# Patient Record
Sex: Female | Born: 1950 | Race: White | Hispanic: No | Marital: Married | Smoking: Never smoker
Health system: Southern US, Community
[De-identification: ages and names within clinical notes are randomized; demographics above are authoritative.]

## PROBLEM LIST (undated history)

## (undated) DIAGNOSIS — J309 Allergic rhinitis, unspecified: Secondary | ICD-10-CM

## (undated) DIAGNOSIS — M1990 Unspecified osteoarthritis, unspecified site: Secondary | ICD-10-CM

## (undated) DIAGNOSIS — IMO0001 Reserved for inherently not codable concepts without codable children: Secondary | ICD-10-CM

## (undated) HISTORY — DX: Unspecified osteoarthritis, unspecified site: M19.90

## (undated) HISTORY — DX: Reserved for inherently not codable concepts without codable children: IMO0001

## (undated) HISTORY — PX: MYRINGOTOMY: SUR874

## (undated) HISTORY — DX: Allergic rhinitis, unspecified: J30.9

---

## 1999-11-19 ENCOUNTER — Other Ambulatory Visit: Admission: RE | Admit: 1999-11-19 | Discharge: 1999-11-19 | Payer: Self-pay | Admitting: Internal Medicine

## 1999-12-23 ENCOUNTER — Ambulatory Visit (HOSPITAL_COMMUNITY): Admission: RE | Admit: 1999-12-23 | Discharge: 1999-12-23 | Payer: Self-pay | Admitting: Internal Medicine

## 1999-12-23 ENCOUNTER — Encounter: Payer: Self-pay | Admitting: Internal Medicine

## 2000-11-11 ENCOUNTER — Other Ambulatory Visit: Admission: RE | Admit: 2000-11-11 | Discharge: 2000-11-11 | Payer: Self-pay | Admitting: Internal Medicine

## 2001-01-05 ENCOUNTER — Encounter: Payer: Self-pay | Admitting: Internal Medicine

## 2001-01-05 ENCOUNTER — Ambulatory Visit (HOSPITAL_COMMUNITY): Admission: RE | Admit: 2001-01-05 | Discharge: 2001-01-05 | Payer: Self-pay | Admitting: Internal Medicine

## 2001-10-19 ENCOUNTER — Other Ambulatory Visit: Admission: RE | Admit: 2001-10-19 | Discharge: 2001-10-19 | Payer: Self-pay | Admitting: Internal Medicine

## 2002-01-30 ENCOUNTER — Encounter: Payer: Self-pay | Admitting: Internal Medicine

## 2002-01-30 ENCOUNTER — Ambulatory Visit (HOSPITAL_COMMUNITY): Admission: RE | Admit: 2002-01-30 | Discharge: 2002-01-30 | Payer: Self-pay | Admitting: Internal Medicine

## 2003-01-31 ENCOUNTER — Ambulatory Visit (HOSPITAL_COMMUNITY): Admission: RE | Admit: 2003-01-31 | Discharge: 2003-01-31 | Payer: Self-pay | Admitting: Internal Medicine

## 2003-01-31 ENCOUNTER — Encounter: Payer: Self-pay | Admitting: Internal Medicine

## 2003-03-04 ENCOUNTER — Other Ambulatory Visit: Admission: RE | Admit: 2003-03-04 | Discharge: 2003-03-04 | Payer: Self-pay | Admitting: Internal Medicine

## 2004-02-06 ENCOUNTER — Ambulatory Visit (HOSPITAL_COMMUNITY): Admission: RE | Admit: 2004-02-06 | Discharge: 2004-02-06 | Payer: Self-pay | Admitting: Internal Medicine

## 2005-02-23 ENCOUNTER — Ambulatory Visit: Payer: Self-pay | Admitting: Internal Medicine

## 2005-04-13 ENCOUNTER — Ambulatory Visit (HOSPITAL_COMMUNITY): Admission: RE | Admit: 2005-04-13 | Discharge: 2005-04-13 | Payer: Self-pay | Admitting: Internal Medicine

## 2006-11-08 ENCOUNTER — Ambulatory Visit (HOSPITAL_COMMUNITY): Admission: RE | Admit: 2006-11-08 | Discharge: 2006-11-08 | Payer: Self-pay | Admitting: Internal Medicine

## 2006-12-14 ENCOUNTER — Ambulatory Visit: Payer: Self-pay | Admitting: Internal Medicine

## 2006-12-14 LAB — CONVERTED CEMR LAB
AST: 19 units/L (ref 0–37)
Albumin: 4 g/dL (ref 3.5–5.2)
Alkaline Phosphatase: 101 units/L (ref 39–117)
BUN: 13 mg/dL (ref 6–23)
Basophils Relative: 0 % (ref 0.0–1.0)
Calcium: 9.3 mg/dL (ref 8.4–10.5)
Cholesterol: 158 mg/dL (ref 0–200)
Eosinophil percent: 6.8 % — ABNORMAL HIGH (ref 0.0–5.0)
HCT: 40 % (ref 36.0–46.0)
Hemoglobin: 14 g/dL (ref 12.0–15.0)
Neutrophils Relative %: 58.4 % (ref 43.0–77.0)
Potassium: 4.1 meq/L (ref 3.5–5.1)
RDW: 12.4 % (ref 11.5–14.6)
Total Bilirubin: 0.6 mg/dL (ref 0.3–1.2)
Total Protein: 7.3 g/dL (ref 6.0–8.3)
Triglyceride fasting, serum: 131 mg/dL (ref 0–149)

## 2006-12-15 ENCOUNTER — Encounter: Payer: Self-pay | Admitting: Internal Medicine

## 2006-12-15 LAB — CONVERTED CEMR LAB: Glucose, Bld: 80 mg/dL (ref 70–99)

## 2006-12-20 ENCOUNTER — Encounter: Payer: Self-pay | Admitting: Internal Medicine

## 2006-12-20 ENCOUNTER — Other Ambulatory Visit: Admission: RE | Admit: 2006-12-20 | Discharge: 2006-12-20 | Payer: Self-pay | Admitting: Internal Medicine

## 2006-12-20 ENCOUNTER — Ambulatory Visit: Payer: Self-pay | Admitting: Internal Medicine

## 2007-09-07 ENCOUNTER — Telehealth: Payer: Self-pay | Admitting: *Deleted

## 2007-10-13 ENCOUNTER — Ambulatory Visit: Payer: Self-pay | Admitting: Internal Medicine

## 2008-04-05 ENCOUNTER — Telehealth: Payer: Self-pay | Admitting: *Deleted

## 2008-04-09 ENCOUNTER — Telehealth: Payer: Self-pay | Admitting: Internal Medicine

## 2008-04-11 ENCOUNTER — Ambulatory Visit: Payer: Self-pay | Admitting: Internal Medicine

## 2008-04-11 DIAGNOSIS — E786 Lipoprotein deficiency: Secondary | ICD-10-CM | POA: Insufficient documentation

## 2008-04-11 DIAGNOSIS — I059 Rheumatic mitral valve disease, unspecified: Secondary | ICD-10-CM | POA: Insufficient documentation

## 2008-04-11 DIAGNOSIS — R21 Rash and other nonspecific skin eruption: Secondary | ICD-10-CM | POA: Insufficient documentation

## 2008-04-11 DIAGNOSIS — H6980 Other specified disorders of Eustachian tube, unspecified ear: Secondary | ICD-10-CM | POA: Insufficient documentation

## 2008-08-01 ENCOUNTER — Ambulatory Visit: Payer: Self-pay | Admitting: Internal Medicine

## 2008-08-01 LAB — CONVERTED CEMR LAB
Albumin: 4.1 g/dL (ref 3.5–5.2)
BUN: 14 mg/dL (ref 6–23)
Bilirubin Urine: NEGATIVE
Calcium: 9.3 mg/dL (ref 8.4–10.5)
Creatinine, Ser: 0.9 mg/dL (ref 0.4–1.2)
Eosinophils Absolute: 0.3 10*3/uL (ref 0.0–0.7)
Eosinophils Relative: 6.3 % — ABNORMAL HIGH (ref 0.0–5.0)
GFR calc Af Amer: 84 mL/min
GFR calc non Af Amer: 69 mL/min
Glucose, Urine, Semiquant: NEGATIVE
HCT: 37.7 % (ref 36.0–46.0)
HDL: 28.7 mg/dL — ABNORMAL LOW (ref >39.0)
MCV: 93.9 fL (ref 78.0–100.0)
Monocytes Absolute: 0.5 10*3/uL (ref 0.1–1.0)
Platelets: 233 10*3/uL (ref 150–400)
Potassium: 4.1 meq/L (ref 3.5–5.1)
Specific Gravity, Urine: >=1.03
TSH: 3.55 microintl units/mL (ref 0.35–5.50)
Triglycerides: 127 mg/dL (ref 0–149)
WBC Urine, dipstick: NEGATIVE
WBC: 5.1 10*3/uL (ref 4.5–10.5)
pH: 5

## 2008-08-06 ENCOUNTER — Encounter: Payer: Self-pay | Admitting: Internal Medicine

## 2008-08-06 ENCOUNTER — Other Ambulatory Visit: Admission: RE | Admit: 2008-08-06 | Discharge: 2008-08-06 | Payer: Self-pay | Admitting: Internal Medicine

## 2008-08-06 ENCOUNTER — Ambulatory Visit: Payer: Self-pay | Admitting: Internal Medicine

## 2008-08-06 DIAGNOSIS — D485 Neoplasm of uncertain behavior of skin: Secondary | ICD-10-CM | POA: Insufficient documentation

## 2008-08-06 DIAGNOSIS — J309 Allergic rhinitis, unspecified: Secondary | ICD-10-CM | POA: Insufficient documentation

## 2008-08-06 DIAGNOSIS — E669 Obesity, unspecified: Secondary | ICD-10-CM | POA: Insufficient documentation

## 2008-08-06 DIAGNOSIS — E785 Hyperlipidemia, unspecified: Secondary | ICD-10-CM | POA: Insufficient documentation

## 2008-08-29 ENCOUNTER — Ambulatory Visit (HOSPITAL_COMMUNITY): Admission: RE | Admit: 2008-08-29 | Discharge: 2008-08-29 | Payer: Self-pay | Admitting: Internal Medicine

## 2008-09-18 ENCOUNTER — Ambulatory Visit: Payer: Self-pay | Admitting: Internal Medicine

## 2009-02-11 ENCOUNTER — Telehealth: Payer: Self-pay | Admitting: Internal Medicine

## 2009-02-20 ENCOUNTER — Ambulatory Visit: Payer: Self-pay | Admitting: Cardiovascular Disease

## 2009-02-20 ENCOUNTER — Ambulatory Visit: Payer: Self-pay

## 2009-09-23 ENCOUNTER — Ambulatory Visit: Payer: Self-pay | Admitting: Internal Medicine

## 2010-10-06 ENCOUNTER — Ambulatory Visit: Payer: Self-pay | Admitting: Internal Medicine

## 2010-12-28 ENCOUNTER — Encounter: Payer: Self-pay | Admitting: Internal Medicine

## 2011-01-05 NOTE — Assessment & Plan Note (Signed)
Summary: Flu shot/ssc  Nurse Visit   Allergies: No Known Drug Allergies  Orders Added: 1)  Admin 1st Vaccine [90471] 2)  Flu Vaccine 102yrs + [30865]      Flu Vaccine Consent Questions     Do you have a history of severe allergic reactions to this vaccine? no    Any prior history of allergic reactions to egg and/or gelatin? no    Do you have a sensitivity to the preservative Thimersol? no    Do you have a past history of Guillan-Barre Syndrome? no    Do you currently have an acute febrile illness? no    Have you ever had a severe reaction to latex? no    Vaccine information given and explained to patient? yes    Are you currently pregnant? no    Lot Number:[id number]   Exp Date:06/05/2011   Site Given  Left Deltoid IM Romualdo Bolk, CMA (AAMA)  October 06, 2010 9:29 AM

## 2011-12-16 ENCOUNTER — Other Ambulatory Visit: Payer: Self-pay | Admitting: Internal Medicine

## 2011-12-16 DIAGNOSIS — Z1231 Encounter for screening mammogram for malignant neoplasm of breast: Secondary | ICD-10-CM

## 2012-01-13 ENCOUNTER — Ambulatory Visit (HOSPITAL_COMMUNITY): Payer: Self-pay | Attending: Internal Medicine

## 2012-05-16 ENCOUNTER — Ambulatory Visit (HOSPITAL_COMMUNITY)
Admission: RE | Admit: 2012-05-16 | Discharge: 2012-05-16 | Disposition: A | Discharge status: Home / Self Care | Payer: BC Managed Care – PPO | Source: Ambulatory Visit | Attending: Internal Medicine | Admitting: Internal Medicine

## 2012-05-16 DIAGNOSIS — Z1231 Encounter for screening mammogram for malignant neoplasm of breast: Secondary | ICD-10-CM | POA: Insufficient documentation

## 2012-09-25 ENCOUNTER — Other Ambulatory Visit (INDEPENDENT_AMBULATORY_CARE_PROVIDER_SITE_OTHER): Payer: BC Managed Care – PPO

## 2012-09-25 DIAGNOSIS — Z0000 Encounter for general adult medical examination without abnormal findings: Secondary | ICD-10-CM

## 2012-09-25 LAB — HEPATIC FUNCTION PANEL
Bilirubin, Direct: 0.1 mg/dL (ref 0.0–0.3)
Total Protein: 6.9 g/dL (ref 6.0–8.3)

## 2012-09-25 LAB — CBC WITH DIFFERENTIAL/PLATELET
Basophils Relative: 0.7 % (ref 0.0–3.0)
Hemoglobin: 13.6 g/dL (ref 12.0–15.0)
Lymphocytes Relative: 22.1 % (ref 12.0–46.0)
Monocytes Relative: 10.6 % (ref 3.0–12.0)
Neutro Abs: 2.5 10*3/uL (ref 1.4–7.7)
RBC: 4.26 Mil/uL (ref 3.87–5.11)
WBC: 4.4 10*3/uL — ABNORMAL LOW (ref 4.5–10.5)

## 2012-09-25 LAB — BASIC METABOLIC PANEL
BUN: 16 mg/dL (ref 6–23)
CO2: 28 mEq/L (ref 19–32)
Chloride: 106 mEq/L (ref 96–112)
Creatinine, Ser: 0.9 mg/dL (ref 0.4–1.2)

## 2012-09-25 LAB — POCT URINALYSIS DIPSTICK
Glucose, UA: NEGATIVE
Nitrite, UA: NEGATIVE
Protein, UA: NEGATIVE
Urobilinogen, UA: 0.2

## 2012-09-25 LAB — TSH: TSH: 2.11 u[IU]/mL (ref 0.35–5.50)

## 2012-09-25 LAB — LIPID PANEL
LDL Cholesterol: 85 mg/dL (ref 0–99)
Total CHOL/HDL Ratio: 6
Triglycerides: 105 mg/dL (ref 0.0–149.0)
VLDL: 21 mg/dL (ref 0.0–40.0)

## 2012-10-02 ENCOUNTER — Encounter: Payer: Self-pay | Admitting: Internal Medicine

## 2012-10-02 ENCOUNTER — Other Ambulatory Visit (HOSPITAL_COMMUNITY)
Admission: RE | Admit: 2012-10-02 | Discharge: 2012-10-02 | Disposition: A | Discharge status: Home / Self Care | Payer: BC Managed Care – PPO | Source: Ambulatory Visit | Attending: Internal Medicine | Admitting: Internal Medicine

## 2012-10-02 ENCOUNTER — Ambulatory Visit (INDEPENDENT_AMBULATORY_CARE_PROVIDER_SITE_OTHER): Payer: BC Managed Care – PPO | Admitting: Internal Medicine

## 2012-10-02 VITALS — BP 122/80 | HR 101 | Temp 97.8°F | Ht 66.25 in | Wt 244.0 lb

## 2012-10-02 DIAGNOSIS — Z800 Family history of malignant neoplasm of digestive organs: Secondary | ICD-10-CM | POA: Insufficient documentation

## 2012-10-02 DIAGNOSIS — Z01419 Encounter for gynecological examination (general) (routine) without abnormal findings: Secondary | ICD-10-CM | POA: Insufficient documentation

## 2012-10-02 DIAGNOSIS — Z0000 Encounter for general adult medical examination without abnormal findings: Secondary | ICD-10-CM | POA: Insufficient documentation

## 2012-10-02 DIAGNOSIS — E786 Lipoprotein deficiency: Secondary | ICD-10-CM

## 2012-10-02 DIAGNOSIS — Z1211 Encounter for screening for malignant neoplasm of colon: Secondary | ICD-10-CM

## 2012-10-02 DIAGNOSIS — Z1151 Encounter for screening for human papillomavirus (HPV): Secondary | ICD-10-CM | POA: Insufficient documentation

## 2012-10-02 DIAGNOSIS — N760 Acute vaginitis: Secondary | ICD-10-CM | POA: Insufficient documentation

## 2012-10-02 NOTE — Progress Notes (Signed)
Subjective:    Patient ID: Natasha [name], female    DOB: 1951-03-09, 61 y.o.   MRN: [id number]  HPI Patient comes in today for Preventive Health Care visit  Last visit was September 2009. Since that time no major injuries hosp ed visits or other change in health. 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  Her last Pap was more than 3 years ago. She's not sure what her colonoscopy was done but she thinks she is due.  Review of Systems ROS:  Had episode of mid chest pain possibly related to swallowing in the summer has resolved. Had episode of left ankle pain when she rose in the morning with difficulty walking not associated with injury twisting redness. It is since resolved. No regular exercise recently. No history of gout. GEN/ HEENT: No fever, significant weight changes sweats headaches vision problems hearing changes, CV/ PULM; No chest pain shortness of breath cough, syncope,edema  change in exercise tolerance. GI /GU: No adominal pain, vomiting, change in bowel habits. No blood in the stool. No significant GU symptoms. SKIN/HEME: ,no acute skin rashes suspicious lesions or bleeding. No lymphadenopathy, nodules, masses.  NEURO/ PSYCH:  No neurologic signs such as weakness numbness. No depression anxiety. IMM/ Allergy: No unusual infections.  Allergy .   REST of 12 system review negative except as per HPI Past history family history social history reviewed in the electronic medical record.     Objective:   Physical Exam BP 122/80  Pulse 101  Temp 97.8 F (36.6 C) (Oral)  Ht 5' 6.25" (1.683 m)  Wt 244 lb (110.678 kg)  BMI 39.09 kg/m2  SpO2 97% Wt Readings from Last 3 Encounters:  10/02/12 244 lb (110.678 kg)  08/06/08 244 lb (110.678 kg)   Physical Exam: Vital signs reviewed ION:GEXB is a well-developed well-nourished alert cooperative  white female who appears her stated age in no acute distress.  HEENT: normocephalic atraumatic , Eyes: PERRL EOM's  full, conjunctiva clear, Nares: paten,t no deformity discharge or tenderness., Ears: no deformity EAC's clear TMs with normal landmarks. Mouth: clear OP, no lesions, edema.  Moist mucous membranes. Dentition in adequate repair. NECK: supple without masses, thyromegaly or bruits. CHEST/PULM:  Clear to auscultation and percussion breath sounds equal no wheeze , rales or rhonchi. No chest wall deformities or tenderness. Breast: normal by inspection . No dimpling, discharge, masses, tenderness or discharge .lumpy but no discrete lump. CV: PMI is nondisplaced, S1 S2 no gallops, murmurs, rubs. Peripheral pulses are full without delay.No JVD .  ABDOMEN: Bowel sounds normal nontender  No guard or rebound, no hepato splenomegal no CVA tenderness.  No hernia. Extremtities:  No clubbing cyanosis or edema, no acute joint swelling or redness no focal atrophy NEURO:  Oriented x3, cranial nerves 3-12 appear to be intact, no obvious focal weakness,gait within normal limits no abnormal reflexes or asymmetrical SKIN: No acute rashes normal turgor, color, no bruising or petechiae. PSYCH: Oriented, good eye contact, no obvious depression anxiety, cognition and judgment appear normal. LN: no cervical axillary inguinal adenopathy  Pelvic: NL ext GU, labia clear without lesions or rash . Vagina no lesions .Cervix: clear  UTERUS: Neg CMT Adnexa:  clear no masses . PAP done rectal no masses stool smear negative. HPV high risk  screen    Lab Results  Component Value Date   WBC 4.4* 09/25/2012   HGB 13.6 09/25/2012   HCT 40.8 09/25/2012   PLT 219.0 09/25/2012   GLUCOSE  90 09/25/2012   CHOL 129 09/25/2012   TRIG 105.0 09/25/2012   HDL 23.00* 09/25/2012   LDLCALC 85 09/25/2012   ALT 16 09/25/2012   AST 17 09/25/2012   NA 140 09/25/2012   K 4.1 09/25/2012   CL 106 09/25/2012   CREATININE 0.9 09/25/2012   BUN 16 09/25/2012   CO2 28 09/25/2012   TSH 2.11 09/25/2012        Assessment & Plan:  Preventive Health  Care Counseled regarding healthy nutrition, exercise, sleep, injury prevention, calcium vit d and healthy weight .PAP today with HPV screen Look  into shingles cvaccine  for next year or if reimbursed.  Flu vaccine LIPIDS low hdl   Counseled.  fam hx colon cancer prob due   No colon procedure in EMR

## 2012-10-02 NOTE — Patient Instructions (Signed)
Increase exercise healthy weight loss and avoiding transfats   Can help decrease HDL. I dont see documentation of colonoscopy in the EHR  You may be due for this .  Preventive Care for Adults, Female A healthy lifestyle and preventive care can promote health and wellness. Preventive health guidelines for women include the following key practices.  A routine yearly physical is a good way to check with your caregiver about your health and preventive screening. It is a chance to share any concerns and updates on your health, and to receive a thorough exam.  Visit your dentist for a routine exam and preventive care every 6 months. Brush your teeth twice a day and floss once a day. Good oral hygiene prevents tooth decay and gum disease.  The frequency of eye exams is based on your age, health, family medical history, use of contact lenses, and other factors. Follow your caregiver's recommendations for frequency of eye exams.  Eat a healthy diet. Foods like vegetables, fruits, whole grains, low-fat dairy products, and lean protein foods contain the nutrients you need without too many calories. Decrease your intake of foods high in solid fats, added sugars, and salt. Eat the right amount of calories for you.Get information about a proper diet from your caregiver, if necessary.  Regular physical exercise is one of the most important things you can do for your health. Most adults should get at least 150 minutes of moderate-intensity exercise (any activity that increases your heart rate and causes you to sweat) each week. In addition, most adults need muscle-strengthening exercises on 2 or more days a week.  Maintain a healthy weight. The body mass index (BMI) is a screening tool to identify possible weight problems. It provides an estimate of body fat based on height and weight. Your caregiver can help determine your BMI, and can help you achieve or maintain a healthy weight.For adults 20 years and  older:  A BMI below 18.5 is considered underweight.  A BMI of 18.5 to 24.9 is normal.  A BMI of 25 to 29.9 is considered overweight.  A BMI of 30 and above is considered obese.  Maintain normal blood lipids and cholesterol levels by exercising and minimizing your intake of saturated fat. Eat a balanced diet with plenty of fruit and vegetables. Blood tests for lipids and cholesterol should begin at age 70 and be repeated every 5 years. If your lipid or cholesterol levels are high, you are over 50, or you are at high risk for heart disease, you may need your cholesterol levels checked more frequently.Ongoing high lipid and cholesterol levels should be treated with medicines if diet and exercise are not effective.  If you smoke, find out from your caregiver how to quit. If you do not use tobacco, do not start.  If you are pregnant, do not drink alcohol. If you are breastfeeding, be very cautious about drinking alcohol. If you are not pregnant and choose to drink alcohol, do not exceed 1 drink per day. One drink is considered to be 12 ounces (355 mL) of beer, 5 ounces (148 mL) of wine, or 1.5 ounces (44 mL) of liquor.  Avoid use of street drugs. Do not share needles with anyone. Ask for help if you need support or instructions about stopping the use of drugs.  High blood pressure causes heart disease and increases the risk of stroke. Your blood pressure should be checked at least every 1 to 2 years. Ongoing high blood pressure should be treated  with medicines if weight loss and exercise are not effective.  If you are 70 to 59 years old, ask your caregiver if you should take aspirin to prevent strokes.  Diabetes screening involves taking a blood sample to check your fasting blood sugar level. This should be done once every 3 years, after age 8, if you are within normal weight and without risk factors for diabetes. Testing should be considered at a younger age or be carried out more frequently if  you are overweight and have at least 1 risk factor for diabetes.  Breast cancer screening is essential preventive care for women. You should practice "breast self-awareness." This means understanding the normal appearance and feel of your breasts and may include breast self-examination. Any changes detected, no matter how small, should be reported to a caregiver. Women in their 4s and 30s should have a clinical breast exam (CBE) by a caregiver as part of a regular health exam every 1 to 3 years. After age 1, women should have a CBE every year. Starting at age 39, women should consider having a mammography (breast X-ray test) every year. Women who have a family history of breast cancer should talk to their caregiver about genetic screening. Women at a high risk of breast cancer should talk to their caregivers about having magnetic resonance imaging (MRI) and a mammography every year.  The Pap test is a screening test for cervical cancer. A Pap test can show cell changes on the cervix that might become cervical cancer if left untreated. A Pap test is a procedure in which cells are obtained and examined from the lower end of the uterus (cervix).  Women should have a Pap test starting at age 52.  Between ages 92 and 33, Pap tests should be repeated every 2 years.  Beginning at age 33, you should have a Pap test every 3 years as long as the past 3 Pap tests have been normal.  Some women have medical problems that increase the chance of getting cervical cancer. Talk to your caregiver about these problems. It is especially important to talk to your caregiver if a new problem develops soon after your last Pap test. In these cases, your caregiver may recommend more frequent screening and Pap tests.  The above recommendations are the same for women who have or have not gotten the vaccine for human papillomavirus (HPV).  If you had a hysterectomy for a problem that was not cancer or a condition that could  lead to cancer, then you no longer need Pap tests. Even if you no longer need a Pap test, a regular exam is a good idea to make sure no other problems are starting.  If you are between ages 53 and 71, and you have had normal Pap tests going back 10 years, you no longer need Pap tests. Even if you no longer need a Pap test, a regular exam is a good idea to make sure no other problems are starting.  If you have had past treatment for cervical cancer or a condition that could lead to cancer, you need Pap tests and screening for cancer for at least 20 years after your treatment.  If Pap tests have been discontinued, risk factors (such as a new sexual partner) need to be reassessed to determine if screening should be resumed.  The HPV test is an additional test that may be used for cervical cancer screening. The HPV test looks for the virus that can cause the cell  changes on the cervix. The cells collected during the Pap test can be tested for HPV. The HPV test could be used to screen women aged 88 years and older, and should be used in women of any age who have unclear Pap test results. After the age of 27, women should have HPV testing at the same frequency as a Pap test.  Colorectal cancer can be detected and often prevented. Most routine colorectal cancer screening begins at the age of 5 and continues through age 6. However, your caregiver may recommend screening at an earlier age if you have risk factors for colon cancer. On a yearly basis, your caregiver may provide home test kits to check for hidden blood in the stool. Use of a small camera at the end of a tube, to directly examine the colon (sigmoidoscopy or colonoscopy), can detect the earliest forms of colorectal cancer. Talk to your caregiver about this at age 28, when routine screening begins. Direct examination of the colon should be repeated every 5 to 10 years through age 33, unless early forms of pre-cancerous polyps or small growths are  found.  Hepatitis C blood testing is recommended for all people born from [birth year] through 1965 and any individual with known risks for hepatitis C.  Practice safe sex. Use condoms and avoid high-risk sexual practices to reduce the spread of sexually transmitted infections (STIs). STIs include gonorrhea, chlamydia, syphilis, trichomonas, herpes, HPV, and human immunodeficiency virus (HIV). Herpes, HIV, and HPV are viral illnesses that have no cure. They can result in disability, cancer, and death. Sexually active women aged 29 and younger should be checked for chlamydia. Older women with new or multiple partners should also be tested for chlamydia. Testing for other STIs is recommended if you are sexually active and at increased risk.  Osteoporosis is a disease in which the bones lose minerals and strength with aging. This can result in serious bone fractures. The risk of osteoporosis can be identified using a bone density scan. Women ages 6 and over and women at risk for fractures or osteoporosis should discuss screening with their caregivers. Ask your caregiver whether you should take a calcium supplement or vitamin D to reduce the rate of osteoporosis.  Menopause can be associated with physical symptoms and risks. Hormone replacement therapy is available to decrease symptoms and risks. You should talk to your caregiver about whether hormone replacement therapy is right for you.  Use sunscreen with sun protection factor (SPF) of 30 or more. Apply sunscreen liberally and repeatedly throughout the day. You should seek shade when your shadow is shorter than you. Protect yourself by wearing long sleeves, pants, a wide-brimmed hat, and sunglasses year round, whenever you are outdoors.  Once a month, do a whole body skin exam, using a mirror to look at the skin on your back. Notify your caregiver of new moles, moles that have irregular borders, moles that are larger than a pencil eraser, or moles that have  changed in shape or color.  Stay current with required immunizations.  Influenza. You need a dose every fall (or winter). The composition of the flu vaccine changes each year, so being vaccinated once is not enough.  Pneumococcal polysaccharide. You need 1 to 2 doses if you smoke cigarettes or if you have certain chronic medical conditions. You need 1 dose at age 73 (or older) if you have never been vaccinated.  Tetanus, diphtheria, pertussis (Tdap, Td). Get 1 dose of Tdap vaccine if you are younger than  age 51, are over 9 and have contact with an infant, are a Research scientist (physical sciences), are pregnant, or simply want to be protected from whooping cough. After that, you need a Td booster dose every 10 years. Consult your caregiver if you have not had at least 3 tetanus and diphtheria-containing shots sometime in your life or have a deep or dirty wound.  HPV. You need this vaccine if you are a woman age 52 or younger. The vaccine is given in 3 doses over 6 months.  Measles, mumps, rubella (MMR). You need at least 1 dose of MMR if you were born in 1957 or later. You may also need a second dose.  Meningococcal. If you are age 37 to 70 and a first-year college student living in a residence hall, or have one of several medical conditions, you need to get vaccinated against meningococcal disease. You may also need additional booster doses.  Zoster (shingles). If you are age 64 or older, you should get this vaccine.  Varicella (chickenpox). If you have never had chickenpox or you were vaccinated but received only 1 dose, talk to your caregiver to find out if you need this vaccine.  Hepatitis A. You need this vaccine if you have a specific risk factor for hepatitis A virus infection or you simply wish to be protected from this disease. The vaccine is usually given as 2 doses, 6 to 18 months apart.  Hepatitis B. You need this vaccine if you have a specific risk factor for hepatitis B virus infection or you  simply wish to be protected from this disease. The vaccine is given in 3 doses, usually over 6 months. Preventive Services / Frequency Ages 26 to 65  Blood pressure check.** / Every 1 to 2 years.  Lipid and cholesterol check.** / Every 5 years beginning at age 31.  Clinical breast exam.** / Every 3 years for women in their 4s and 30s.  Pap test.** / Every 2 years from ages 78 through 87. Every 3 years starting at age 12 through age 72 or 97 with a history of 3 consecutive normal Pap tests.  HPV screening.** / Every 3 years from ages 51 through ages 19 to 50 with a history of 3 consecutive normal Pap tests.  Hepatitis C blood test.** / For any individual with known risks for hepatitis C.  Skin self-exam. / Monthly.  Influenza immunization.** / Every year.  Pneumococcal polysaccharide immunization.** / 1 to 2 doses if you smoke cigarettes or if you have certain chronic medical conditions.  Tetanus, diphtheria, pertussis (Tdap, Td) immunization. / A one-time dose of Tdap vaccine. After that, you need a Td booster dose every 10 years.  HPV immunization. / 3 doses over 6 months, if you are 85 and younger.  Measles, mumps, rubella (MMR) immunization. / You need at least 1 dose of MMR if you were born in 1957 or later. You may also need a second dose.  Meningococcal immunization. / 1 dose if you are age 8 to 27 and a first-year college student living in a residence hall, or have one of several medical conditions, you need to get vaccinated against meningococcal disease. You may also need additional booster doses.  Varicella immunization.** / Consult your caregiver.  Hepatitis A immunization.** / Consult your caregiver. 2 doses, 6 to 18 months apart.  Hepatitis B immunization.** / Consult your caregiver. 3 doses usually over 6 months. Ages 52 to 18  Blood pressure check.** / Every 1 to 2 years.  Lipid and cholesterol check.** / Every 5 years beginning at age 79.  Clinical breast  exam.** / Every year after age 65.  Mammogram.** / Every year beginning at age 92 and continuing for as long as you are in good health. Consult with your caregiver.  Pap test.** / Every 3 years starting at age 32 through age 9 or 11 with a history of 3 consecutive normal Pap tests.  HPV screening.** / Every 3 years from ages 60 through ages 76 to 70 with a history of 3 consecutive normal Pap tests.  Fecal occult blood test (FOBT) of stool. / Every year beginning at age 17 and continuing until age 72. You may not need to do this test if you get a colonoscopy every 10 years.  Flexible sigmoidoscopy or colonoscopy.** / Every 5 years for a flexible sigmoidoscopy or every 10 years for a colonoscopy beginning at age 36 and continuing until age 40.  Hepatitis C blood test.** / For all people born from [birth year] through 1963 and any individual with known risks for hepatitis C.  Skin self-exam. / Monthly.  Influenza immunization.** / Every year.  Pneumococcal polysaccharide immunization.** / 1 to 2 doses if you smoke cigarettes or if you have certain chronic medical conditions.  Tetanus, diphtheria, pertussis (Tdap, Td) immunization.** / A one-time dose of Tdap vaccine. After that, you need a Td booster dose every 10 years.  Measles, mumps, rubella (MMR) immunization. / You need at least 1 dose of MMR if you were born in 1957 or later. You may also need a second dose.  Varicella immunization.** / Consult your caregiver.  Meningococcal immunization.** / Consult your caregiver.  Hepatitis A immunization.** / Consult your caregiver. 2 doses, 6 to 18 months apart.  Hepatitis B immunization.** / Consult your caregiver. 3 doses, usually over 6 months. Ages 33 and over  Blood pressure check.** / Every 1 to 2 years.  Lipid and cholesterol check.** / Every 5 years beginning at age 16.  Clinical breast exam.** / Every year after age 87.  Mammogram.** / Every year beginning at age 40 and continuing  for as long as you are in good health. Consult with your caregiver.  Pap test.** / Every 3 years starting at age 52 through age 72 or 39 with a 3 consecutive normal Pap tests. Testing can be stopped between 65 and 70 with 3 consecutive normal Pap tests and no abnormal Pap or HPV tests in the past 10 years.  HPV screening.** / Every 3 years from ages 38 through ages 51 or 25 with a history of 3 consecutive normal Pap tests. Testing can be stopped between 65 and 70 with 3 consecutive normal Pap tests and no abnormal Pap or HPV tests in the past 10 years.  Fecal occult blood test (FOBT) of stool. / Every year beginning at age 47 and continuing until age 81. You may not need to do this test if you get a colonoscopy every 10 years.  Flexible sigmoidoscopy or colonoscopy.** / Every 5 years for a flexible sigmoidoscopy or every 10 years for a colonoscopy beginning at age 21 and continuing until age 104.  Hepatitis C blood test.** / For all people born from [birth year] through 1965 and any individual with known risks for hepatitis C.  Osteoporosis screening.** / A one-time screening for women ages 25 and over and women at risk for fractures or osteoporosis.  Skin self-exam. / Monthly.  Influenza immunization.** / Every year.  Pneumococcal polysaccharide immunization.** /  1 dose at age 38 (or older) if you have never been vaccinated.  Tetanus, diphtheria, pertussis (Tdap, Td) immunization. / A one-time dose of Tdap vaccine if you are over 65 and have contact with an infant, are a Research scientist (physical sciences), or simply want to be protected from whooping cough. After that, you need a Td booster dose every 10 years.  Varicella immunization.** / Consult your caregiver.  Meningococcal immunization.** / Consult your caregiver.  Hepatitis A immunization.** / Consult your caregiver. 2 doses, 6 to 18 months apart.  Hepatitis B immunization.** / Check with your caregiver. 3 doses, usually over 6 months. ** Family history  and personal history of risk and conditions may change your caregiver's recommendations. Document Released: 01/18/2002 Document Revised: 02/14/2012 Document Reviewed: 04/19/2011 [facility] Patient Information 2013 [place], [place].

## 2013-09-08 ENCOUNTER — Ambulatory Visit (INDEPENDENT_AMBULATORY_CARE_PROVIDER_SITE_OTHER): Payer: BC Managed Care – PPO | Admitting: Internal Medicine

## 2013-09-08 VITALS — BP 130/78 | HR 71 | Resp 18 | Ht 65.5 in | Wt 242.0 lb

## 2013-09-08 DIAGNOSIS — R21 Rash and other nonspecific skin eruption: Secondary | ICD-10-CM

## 2013-09-08 LAB — POCT SKIN KOH: Skin KOH, POC: NEGATIVE

## 2013-09-08 MED ORDER — PERMETHRIN 5 % EX CREA: TOPICAL_CREAM | Freq: Once | CUTANEOUS | Status: DC

## 2013-09-08 NOTE — Progress Notes (Signed)
  Subjective:    Patient ID: Natasha [name], female    DOB: May 03, 1951, 62 y.o.   MRN: [id number]  HPI Skin lesions, pruritic spreading over 4 days from abd skinfolds to breasts, to L arm, to face No known unus expos/never outdoors No iiln or fever  Review of Systems Patient Active Problem List   Diagnosis Date Noted  . Routine general medical examination at a health care facility 10/02/2012  . Routine gynecological examination 10/02/2012  . Family history of colon cancer 10/02/2012  . NEOPLASM, SKIN, UNCERTAIN BEHAVIOR 08/06/2008  . HYPERLIPIDEMIA 08/06/2008  . OBESITY 08/06/2008  . ALLERGIC RHINITIS 08/06/2008  . LOW HDL 04/11/2008  . 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 MITRAL VALVE PROLAPSE 04/11/2008  . RASH-NONVESICULAR 04/11/2008  No current outpatient prescriptions on file.      Objective:   Physical Exam  Constitutional:     BP 130/78  Pulse 71  Resp 18  Ht 5' 5.5" (1.664 m)  Wt 242 lb (109.77 kg)  BMI 39.64 kg/m2  SpO2 99% Red lesions with scaly vesicular crusts in places and with linear streak morphology  Skin scraping= Results for orders placed in visit on 09/08/13  POCT SKIN KOH      Result Value Range   Skin KOH, POC Negative           Assessment & Plan:  Rash suggestive of scabies  elimite plus other routine hygiene F/u 1-2 weeks

## 2013-09-09 ENCOUNTER — Encounter: Payer: Self-pay | Admitting: Radiology

## 2013-09-25 ENCOUNTER — Other Ambulatory Visit (INDEPENDENT_AMBULATORY_CARE_PROVIDER_SITE_OTHER): Payer: BC Managed Care – PPO

## 2013-09-25 DIAGNOSIS — Z0000 Encounter for general adult medical examination without abnormal findings: Secondary | ICD-10-CM

## 2013-09-25 LAB — CBC WITH DIFFERENTIAL/PLATELET
Basophils Relative: 1 % (ref 0.0–3.0)
Eosinophils Relative: 13.4 % — ABNORMAL HIGH (ref 0.0–5.0)
Hemoglobin: 13.7 g/dL (ref 12.0–15.0)
Lymphocytes Relative: 19.9 % (ref 12.0–46.0)
Monocytes Relative: 9.2 % (ref 3.0–12.0)
Neutro Abs: 2.9 10*3/uL (ref 1.4–7.7)
Neutrophils Relative %: 56.5 % (ref 43.0–77.0)
RBC: 4.28 Mil/uL (ref 3.87–5.11)
WBC: 5.2 10*3/uL (ref 4.5–10.5)

## 2013-09-25 LAB — HEPATIC FUNCTION PANEL
AST: 13 U/L (ref 0–37)
Albumin: 4.1 g/dL (ref 3.5–5.2)
Alkaline Phosphatase: 84 U/L (ref 39–117)
Bilirubin, Direct: 0 mg/dL (ref 0.0–0.3)
Total Protein: 7.3 g/dL (ref 6.0–8.3)

## 2013-09-25 LAB — LIPID PANEL
LDL Cholesterol: 104 mg/dL — ABNORMAL HIGH (ref 0–99)
Total CHOL/HDL Ratio: 5
Triglycerides: 133 mg/dL (ref 0.0–149.0)

## 2013-09-25 LAB — BASIC METABOLIC PANEL
CO2: 26 mEq/L (ref 19–32)
Calcium: 9.4 mg/dL (ref 8.4–10.5)
Creatinine, Ser: 1 mg/dL (ref 0.4–1.2)
GFR: 62.25 mL/min (ref >60.00)
Sodium: 140 mEq/L (ref 135–145)

## 2013-09-25 LAB — POCT URINALYSIS DIPSTICK
Bilirubin, UA: NEGATIVE
Glucose, UA: NEGATIVE
Leukocytes, UA: NEGATIVE
Nitrite, UA: NEGATIVE
Urobilinogen, UA: 0.2
pH, UA: 5.5

## 2013-09-25 NOTE — Addendum Note (Signed)
Addended by: Bonnye Fava on: 09/25/2013 08:28 AM   Modules accepted: Orders

## 2013-09-26 ENCOUNTER — Other Ambulatory Visit: Payer: BC Managed Care – PPO

## 2013-10-03 ENCOUNTER — Ambulatory Visit (INDEPENDENT_AMBULATORY_CARE_PROVIDER_SITE_OTHER): Payer: BC Managed Care – PPO | Admitting: Internal Medicine

## 2013-10-03 ENCOUNTER — Encounter: Payer: Self-pay | Admitting: Internal Medicine

## 2013-10-03 VITALS — BP 132/80 | HR 80 | Temp 98.1°F | Ht 65.5 in

## 2013-10-03 DIAGNOSIS — Z800 Family history of malignant neoplasm of digestive organs: Secondary | ICD-10-CM

## 2013-10-03 DIAGNOSIS — Z0000 Encounter for general adult medical examination without abnormal findings: Secondary | ICD-10-CM

## 2013-10-03 DIAGNOSIS — E786 Lipoprotein deficiency: Secondary | ICD-10-CM

## 2013-10-03 NOTE — Progress Notes (Signed)
Chief Complaint  Patient presents with  . Annual Exam    HPI: Patient comes in today for Preventive Health Care visit .   Had rash that was dx as scabies  And   got better   With rx  . No know expos  Pain inside  Area below knee.   fam hx  Systemic sclerosis   And  Sbo.  Age 62  Mom with ms.   Working more than full time.    Likes it but very busy  Medial leg pain medial to right knee no injury VV but no ulceration or redness wants to know sx of clots.   Knows that weight is an issue  Trying declined weight today  Wants to wait on zostovax and check insurance   ROS:  GEN/ HEENT: No fever, significant weight changes sweats headaches vision problems hearing changes, CV/ PULM; No chest pain shortness of breath cough, syncope,edema  change in exercise tolerance. GI /GU: No adominal pain, vomiting, change in bowel habits. No blood in the stool. No significant GU symptoms. SKIN/HEME: ,no acute skin rashes suspicious lesions or bleeding. No lymphadenopathy, nodules, masses.  NEURO/ PSYCH:  No neurologic signs such as weakness numbness. No depression anxiety. IMM/ Allergy: No unusual infections.  Allergy .   REST of 12 system review negative except as per HPI   Past Medical History  Diagnosis Date  . Allergic rhinitis   . Normal cardiac stress test     Family History  Problem Relation Age of Onset  . Lung disease Father   . Cancer Father     Colon  . Multiple sclerosis Mother   . Scleroderma Sister   . Parkinson's disease Maternal Grandmother     History   Social History  . Marital Status: Married    Spouse Name: N/A    Number of Children: N/A  . Years of Education: N/A   Social History Main Topics  . Smoking status: Never Smoker   . Smokeless tobacco: None  . Alcohol Use: Yes  . Drug Use: No  . Sexual Activity: None   Other Topics Concern  . 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    No tob    Outpatient Encounter Prescriptions as of 10/03/2013  Medication Sig Dispense Refill  . [DISCONTINUED] permethrin (ELIMITE) 5 % cream Apply topically once. As directed  60 g  1   No facility-administered encounter medications on file as of 10/03/2013.    EXAM:  BP 132/80  Pulse 80  Temp(Src) 98.1 F (36.7 C) (Oral)  Ht 5' 5.5" (1.664 m)  SpO2 98%  Body mass index is 0.00 kg/(m^2).  Physical Exam: Vital signs reviewed [id number] is a well-developed well-nourished alert cooperative   female who appears her stated age in no acute distress.  HEENT: normocephalic atraumatic , Eyes: PERRL EOM's full, conjunctiva clear, glassess Nares: paten,t no deformity discharge or tenderness., Ears: no deformity EAC's clear TMs with normal landmarks. Mouth: clear OP, no lesions, edema.  Moist mucous membranes. Dentition in adequate repair. NECK: supple without masses, thyromegaly or bruits. CHEST/PULM:  Clear to auscultation and percussion breath sounds equal no wheeze , rales or rhonchi. No chest wall deformities or tenderness. Breast: normal by inspection . No dimpling, discharge, masses, tenderness or discharge . CV: PMI is nondisplaced, S1  S2 no gallops, murmurs, rubs. Peripheral pulses are full without delay.No JVD .  ABDOMEN: Bowel sounds normal nontender  No guard or rebound, no hepato splenomegal no CVA tenderness.  No hernia. Extremtities:  No clubbing cyanosis or edema, no acute joint swelling or redness no focal atrophy vv area medial inferior to knee anteriorisly currently non tender  no calf pain  NEURO:  Oriented x3, cranial nerves 3-12 appear to be intact, no obvious focal weakness,gait within normal limits no abnormal reflexes or asymmetrical SKIN: No acute rashes normal turgor, color, no bruising or petechiae. PSYCH: Oriented, good eye contact, no obvious depression anxiety, cognition and judgment appear normal. LN: no  cervical axillaryadenopathy  Lab Results  Component Value Date   WBC 5.2 09/25/2013   HGB 13.7 09/25/2013   HCT 39.5 09/25/2013   PLT 238.0 09/25/2013   GLUCOSE 95 09/25/2013   CHOL 160 09/25/2013   TRIG 133.0 09/25/2013   HDL 29.90* 09/25/2013   LDLCALC 104* 09/25/2013   ALT 11 09/25/2013   AST 13 09/25/2013   NA 140 09/25/2013   K 3.5 09/25/2013   CL 105 09/25/2013   CREATININE 1.0 09/25/2013   BUN 20 09/25/2013   CO2 26 09/25/2013   TSH 4.26 09/25/2013    ASSESSMENT AND PLAN:  Discussed the following assessment and plan:  Visit for preventive health examination  Family history of colon cancer  Low HDL (under 40) No evidence of dvt but disc sx to return or seek care  Disc weigh tcontrol strategies   Including bariatric interventions. Lipids could  improve with lsi  To get colonoscopy next year  Patient Care Team: Madelin Headings, MD as PCP - General Patient Instructions  Can get shingles vaccine  When convenient    Shot only.   150 minutes of exercise weeks  ,  Lose weight  To healthy levels. Avoid trans fats and processed foods;  Increase fresh fruits and veges to 5 servings per day. And avoid sweet beverages  Including tea and juice.  Preventive Care for Adults, Female A healthy lifestyle and preventive care can promote health and wellness. Preventive health guidelines for women include the following key practices.  A routine yearly physical is a good way to check with your caregiver about your health and preventive screening. It is a chance to share any concerns and updates on your health, and to receive a thorough exam.  Visit your dentist for a routine exam and preventive care every 6 months. Brush your teeth twice a day and floss once a day. Good oral hygiene prevents tooth decay and gum disease.  The frequency of eye exams is based on your age, health, family medical history, use of contact lenses, and other factors. Follow your caregiver's recommendations  for frequency of eye exams.  Eat a healthy diet. Foods like vegetables, fruits, whole grains, low-fat dairy products, and lean protein foods contain the nutrients you need without too many calories. Decrease your intake of foods high in solid fats, added sugars, and salt. Eat the right amount of calories for you.Get information about a proper diet from your caregiver, if necessary.  Regular physical exercise is one of the most important things you can do for your health. Most adults should get at least 150 minutes of moderate-intensity exercise (any activity that increases your heart rate and causes you to sweat) each week. In addition, most adults need muscle-strengthening exercises on 2 or more days a week.  Maintain a healthy weight. The body  mass index (BMI) is a screening tool to identify possible weight problems. It provides an estimate of body fat based on height and weight. Your caregiver can help determine your BMI, and can help you achieve or maintain a healthy weight.For adults 20 years and older:  A BMI below 18.5 is considered underweight.  A BMI of 18.5 to 24.9 is normal.  A BMI of 25 to 29.9 is considered overweight.  A BMI of 30 and above is considered obese.  Maintain normal blood lipids and cholesterol levels by exercising and minimizing your intake of saturated fat. Eat a balanced diet with plenty of fruit and vegetables. Blood tests for lipids and cholesterol should begin at age 26 and be repeated every 5 years. If your lipid or cholesterol levels are high, you are over 50, or you are at high risk for heart disease, you may need your cholesterol levels checked more frequently.Ongoing high lipid and cholesterol levels should be treated with medicines if diet and exercise are not effective.  If you smoke, find out from your caregiver how to quit. If you do not use tobacco, do not start.  If you are pregnant, do not drink alcohol. If you are breastfeeding, be very cautious  about drinking alcohol. If you are not pregnant and choose to drink alcohol, do not exceed 1 drink per day. One drink is considered to be 12 ounces (355 mL) of beer, 5 ounces (148 mL) of wine, or 1.5 ounces (44 mL) of liquor.  Avoid use of street drugs. Do not share needles with anyone. Ask for help if you need support or instructions about stopping the use of drugs.  High blood pressure causes heart disease and increases the risk of stroke. Your blood pressure should be checked at least every 1 to 2 years. Ongoing high blood pressure should be treated with medicines if weight loss and exercise are not effective.  If you are 16 to 60 years old, ask your caregiver if you should take aspirin to prevent strokes.  Diabetes screening involves taking a blood sample to check your fasting blood sugar level. This should be done once every 3 years, after age 59, if you are within normal weight and without risk factors for diabetes. Testing should be considered at a younger age or be carried out more frequently if you are overweight and have at least 1 risk factor for diabetes.  Breast cancer screening is essential preventive care for women. You should practice "breast self-awareness." This means understanding the normal appearance and feel of your breasts and may include breast self-examination. Any changes detected, no matter how small, should be reported to a caregiver. Women in their 69s and 30s should have a clinical breast exam (CBE) by a caregiver as part of a regular health exam every 1 to 3 years. After age 32, women should have a CBE every year. Starting at age 38, women should consider having a mammography (breast X-ray test) every year. Women who have a family history of breast cancer should talk to their caregiver about genetic screening. Women at a high risk of breast cancer should talk to their caregivers about having magnetic resonance imaging (MRI) and a mammography every year.  The Pap test is a  screening test for cervical cancer. A Pap test can show cell changes on the cervix that might become cervical cancer if left untreated. A Pap test is a procedure in which cells are obtained and examined from the lower end of the uterus (  cervix).  Women should have a Pap test starting at age 12.  Between ages 78 and 30, Pap tests should be repeated every 2 years.  Beginning at age 1, you should have a Pap test every 3 years as long as the past 3 Pap tests have been normal.  Some women have medical problems that increase the chance of getting cervical cancer. Talk to your caregiver about these problems. It is especially important to talk to your caregiver if a new problem develops soon after your last Pap test. In these cases, your caregiver may recommend more frequent screening and Pap tests.  The above recommendations are the same for women who have or have not gotten the vaccine for human papillomavirus (HPV).  If you had a hysterectomy for a problem that was not cancer or a condition that could lead to cancer, then you no longer need Pap tests. Even if you no longer need a Pap test, a regular exam is a good idea to make sure no other problems are starting.  If you are between ages 52 and 75, and you have had normal Pap tests going back 10 years, you no longer need Pap tests. Even if you no longer need a Pap test, a regular exam is a good idea to make sure no other problems are starting.  If you have had past treatment for cervical cancer or a condition that could lead to cancer, you need Pap tests and screening for cancer for at least 20 years after your treatment.  If Pap tests have been discontinued, risk factors (such as a new sexual partner) need to be reassessed to determine if screening should be resumed.  The HPV test is an additional test that may be used for cervical cancer screening. The HPV test looks for the virus that can cause the cell changes on the cervix. The cells collected  during the Pap test can be tested for HPV. The HPV test could be used to screen women aged 25 years and older, and should be used in women of any age who have unclear Pap test results. After the age of 35, women should have HPV testing at the same frequency as a Pap test.  Colorectal cancer can be detected and often prevented. Most routine colorectal cancer screening begins at the age of 69 and continues through age 76. However, your caregiver may recommend screening at an earlier age if you have risk factors for colon cancer. On a yearly basis, your caregiver may provide home test kits to check for hidden blood in the stool. Use of a small camera at the end of a tube, to directly examine the colon (sigmoidoscopy or colonoscopy), can detect the earliest forms of colorectal cancer. Talk to your caregiver about this at age 62, when routine screening begins. Direct examination of the colon should be repeated every 5 to 10 years through age 67, unless early forms of pre-cancerous polyps or small growths are found.  Hepatitis C blood testing is recommended for all people born from [birth year] through 1965 and any individual with known risks for hepatitis C.  Practice safe sex. Use condoms and avoid high-risk sexual practices to reduce the spread of sexually transmitted infections (STIs). STIs include gonorrhea, chlamydia, syphilis, trichomonas, herpes, HPV, and human immunodeficiency virus (HIV). Herpes, HIV, and HPV are viral illnesses that have no cure. They can result in disability, cancer, and death. Sexually active women aged 8 and younger should be checked for chlamydia. Older women with  new or multiple partners should also be tested for chlamydia. Testing for other STIs is recommended if you are sexually active and at increased risk.  Osteoporosis is a disease in which the bones lose minerals and strength with aging. This can result in serious bone fractures. The risk of osteoporosis can be identified using a  bone density scan. Women ages 23 and over and women at risk for fractures or osteoporosis should discuss screening with their caregivers. Ask your caregiver whether you should take a calcium supplement or vitamin D to reduce the rate of osteoporosis.  Menopause can be associated with physical symptoms and risks. Hormone replacement therapy is available to decrease symptoms and risks. You should talk to your caregiver about whether hormone replacement therapy is right for you.  Use sunscreen with sun protection factor (SPF) of 30 or more. Apply sunscreen liberally and repeatedly throughout the day. You should seek shade when your shadow is shorter than you. Protect yourself by wearing long sleeves, pants, a wide-brimmed hat, and sunglasses year round, whenever you are outdoors.  Once a month, do a whole body skin exam, using a mirror to look at the skin on your back. Notify your caregiver of new moles, moles that have irregular borders, moles that are larger than a pencil eraser, or moles that have changed in shape or color.  Stay current with required immunizations.  Influenza. You need a dose every fall (or winter). The composition of the flu vaccine changes each year, so being vaccinated once is not enough.  Pneumococcal polysaccharide. You need 1 to 2 doses if you smoke cigarettes or if you have certain chronic medical conditions. You need 1 dose at age 13 (or older) if you have never been vaccinated.  Tetanus, diphtheria, pertussis (Tdap, Td). Get 1 dose of Tdap vaccine if you are younger than age 6, are over 36 and have contact with an infant, are a Research scientist (physical sciences), are pregnant, or simply want to be protected from whooping cough. After that, you need a Td booster dose every 10 years. Consult your caregiver if you have not had at least 3 tetanus and diphtheria-containing shots sometime in your life or have a deep or dirty wound.  HPV. You need this vaccine if you are a woman age 67 or  younger. The vaccine is given in 3 doses over 6 months.  Measles, mumps, rubella (MMR). You need at least 1 dose of MMR if you were born in 1957 or later. You may also need a second dose.  Meningococcal. If you are age 62 to 64 and a first-year college student living in a residence hall, or have one of several medical conditions, you need to get vaccinated against meningococcal disease. You may also need additional booster doses.  Zoster (shingles). If you are age 16 or older, you should get this vaccine.  Varicella (chickenpox). If you have never had chickenpox or you were vaccinated but received only 1 dose, talk to your caregiver to find out if you need this vaccine.  Hepatitis A. You need this vaccine if you have a specific risk factor for hepatitis A virus infection or you simply wish to be protected from this disease. The vaccine is usually given as 2 doses, 6 to 18 months apart.  Hepatitis B. You need this vaccine if you have a specific risk factor for hepatitis B virus infection or you simply wish to be protected from this disease. The vaccine is given in 3 doses, usually over 6  months. Preventive Services / Frequency Ages 66 to 45  Blood pressure check.** / Every 1 to 2 years.  Lipid and cholesterol check.** / Every 5 years beginning at age 17.  Clinical breast exam.** / Every 3 years for women in their 47s and 30s.  Pap test.** / Every 2 years from ages 41 through 21. Every 3 years starting at age 46 through age 51 or 68 with a history of 3 consecutive normal Pap tests.  HPV screening.** / Every 3 years from ages 44 through ages 35 to 11 with a history of 3 consecutive normal Pap tests.  Hepatitis C blood test.** / For any individual with known risks for hepatitis C.  Skin self-exam. / Monthly.  Influenza immunization.** / Every year.  Pneumococcal polysaccharide immunization.** / 1 to 2 doses if you smoke cigarettes or if you have certain chronic medical  conditions.  Tetanus, diphtheria, pertussis (Tdap, Td) immunization. / A one-time dose of Tdap vaccine. After that, you need a Td booster dose every 10 years.  HPV immunization. / 3 doses over 6 months, if you are 91 and younger.  Measles, mumps, rubella (MMR) immunization. / You need at least 1 dose of MMR if you were born in 1957 or later. You may also need a second dose.  Meningococcal immunization. / 1 dose if you are age 26 to 75 and a first-year college student living in a residence hall, or have one of several medical conditions, you need to get vaccinated against meningococcal disease. You may also need additional booster doses.  Varicella immunization.** / Consult your caregiver.  Hepatitis A immunization.** / Consult your caregiver. 2 doses, 6 to 18 months apart.  Hepatitis B immunization.** / Consult your caregiver. 3 doses usually over 6 months. Ages 45 to 80  Blood pressure check.** / Every 1 to 2 years.  Lipid and cholesterol check.** / Every 5 years beginning at age 67.  Clinical breast exam.** / Every year after age 21.  Mammogram.** / Every year beginning at age 6 and continuing for as long as you are in good health. Consult with your caregiver.  Pap test.** / Every 3 years starting at age 79 through age 108 or 35 with a history of 3 consecutive normal Pap tests.  HPV screening.** / Every 3 years from ages 22 through ages 33 to 33 with a history of 3 consecutive normal Pap tests.  Fecal occult blood test (FOBT) of stool. / Every year beginning at age 92 and continuing until age 32. You may not need to do this test if you get a colonoscopy every 10 years.  Flexible sigmoidoscopy or colonoscopy.** / Every 5 years for a flexible sigmoidoscopy or every 10 years for a colonoscopy beginning at age 66 and continuing until age 26.  Hepatitis C blood test.** / For all people born from [birth year] through 1963 and any individual with known risks for hepatitis C.  Skin self-exam. /  Monthly.  Influenza immunization.** / Every year.  Pneumococcal polysaccharide immunization.** / 1 to 2 doses if you smoke cigarettes or if you have certain chronic medical conditions.  Tetanus, diphtheria, pertussis (Tdap, Td) immunization.** / A one-time dose of Tdap vaccine. After that, you need a Td booster dose every 10 years.  Measles, mumps, rubella (MMR) immunization. / You need at least 1 dose of MMR if you were born in 1957 or later. You may also need a second dose.  Varicella immunization.** / Consult your caregiver.  Meningococcal immunization.** /  Consult your caregiver.  Hepatitis A immunization.** / Consult your caregiver. 2 doses, 6 to 18 months apart.  Hepatitis B immunization.** / Consult your caregiver. 3 doses, usually over 6 months. Ages 66 and over  Blood pressure check.** / Every 1 to 2 years.  Lipid and cholesterol check.** / Every 5 years beginning at age 67.  Clinical breast exam.** / Every year after age 36.  Mammogram.** / Every year beginning at age 79 and continuing for as long as you are in good health. Consult with your caregiver.  Pap test.** / Every 3 years starting at age 21 through age 42 or 23 with a 3 consecutive normal Pap tests. Testing can be stopped between 65 and 70 with 3 consecutive normal Pap tests and no abnormal Pap or HPV tests in the past 10 years.  HPV screening.** / Every 3 years from ages 35 through ages 4 or 4 with a history of 3 consecutive normal Pap tests. Testing can be stopped between 65 and 70 with 3 consecutive normal Pap tests and no abnormal Pap or HPV tests in the past 10 years.  Fecal occult blood test (FOBT) of stool. / Every year beginning at age 53 and continuing until age 28. You may not need to do this test if you get a colonoscopy every 10 years.  Flexible sigmoidoscopy or colonoscopy.** / Every 5 years for a flexible sigmoidoscopy or every 10 years for a colonoscopy beginning at age 62 and continuing until age  44.  Hepatitis C blood test.** / For all people born from [birth year] through 1963 and any individual with known risks for hepatitis C.  Osteoporosis screening.** / A one-time screening for women ages 78 and over and women at risk for fractures or osteoporosis.  Skin self-exam. / Monthly.  Influenza immunization.** / Every year.  Pneumococcal polysaccharide immunization.** / 1 dose at age 15 (or older) if you have never been vaccinated.  Tetanus, diphtheria, pertussis (Tdap, Td) immunization. / A one-time dose of Tdap vaccine if you are over 65 and have contact with an infant, are a Research scientist (physical sciences), or simply want to be protected from whooping cough. After that, you need a Td booster dose every 10 years.  Varicella immunization.** / Consult your caregiver.  Meningococcal immunization.** / Consult your caregiver.  Hepatitis A immunization.** / Consult your caregiver. 2 doses, 6 to 18 months apart.  Hepatitis B immunization.** / Check with your caregiver. 3 doses, usually over 6 months. ** Family history and personal history of risk and conditions may change your caregiver's recommendations. Document Released: 01/18/2002 Document Revised: 02/14/2012 Document Reviewed: 04/19/2011 [facility] Patient Information 2014 [place], [place].        Neta Mends. Panosh M.D. Health Maintenance  Topic Date Due  . Colonoscopy  09/11/2003  . Zostavax  09/10/2013  . Mammogram  05/16/2014  . Influenza Vaccine  07/06/2014  . Pap Smear  10/03/2015  . Tetanus/tdap  04/11/2018   Health Maintenance Review

## 2013-10-03 NOTE — Patient Instructions (Signed)
Can get shingles vaccine  When convenient    Shot only.   150 minutes of exercise weeks  ,  Lose weight  To healthy levels. Avoid trans fats and processed foods;  Increase fresh fruits and veges to 5 servings per day. And avoid sweet beverages  Including tea and juice.  Preventive Care for Adults, Female A healthy lifestyle and preventive care can promote health and wellness. Preventive health guidelines for women include the following key practices.  A routine yearly physical is a good way to check with your caregiver about your health and preventive screening. It is a chance to share any concerns and updates on your health, and to receive a thorough exam.  Visit your dentist for a routine exam and preventive care every 6 months. Brush your teeth twice a day and floss once a day. Good oral hygiene prevents tooth decay and gum disease.  The frequency of eye exams is based on your age, health, family medical history, use of contact lenses, and other factors. Follow your caregiver's recommendations for frequency of eye exams.  Eat a healthy diet. Foods like vegetables, fruits, whole grains, low-fat dairy products, and lean protein foods contain the nutrients you need without too many calories. Decrease your intake of foods high in solid fats, added sugars, and salt. Eat the right amount of calories for you.Get information about a proper diet from your caregiver, if necessary.  Regular physical exercise is one of the most important things you can do for your health. Most adults should get at least 150 minutes of moderate-intensity exercise (any activity that increases your heart rate and causes you to sweat) each week. In addition, most adults need muscle-strengthening exercises on 2 or more days a week.  Maintain a healthy weight. The body mass index (BMI) is a screening tool to identify possible weight problems. It provides an estimate of body fat based on height and weight. Your caregiver can help  determine your BMI, and can help you achieve or maintain a healthy weight.For adults 20 years and older:  A BMI below 18.5 is considered underweight.  A BMI of 18.5 to 24.9 is normal.  A BMI of 25 to 29.9 is considered overweight.  A BMI of 30 and above is considered obese.  Maintain normal blood lipids and cholesterol levels by exercising and minimizing your intake of saturated fat. Eat a balanced diet with plenty of fruit and vegetables. Blood tests for lipids and cholesterol should begin at age 25 and be repeated every 5 years. If your lipid or cholesterol levels are high, you are over 50, or you are at high risk for heart disease, you may need your cholesterol levels checked more frequently.Ongoing high lipid and cholesterol levels should be treated with medicines if diet and exercise are not effective.  If you smoke, find out from your caregiver how to quit. If you do not use tobacco, do not start.  If you are pregnant, do not drink alcohol. If you are breastfeeding, be very cautious about drinking alcohol. If you are not pregnant and choose to drink alcohol, do not exceed 1 drink per day. One drink is considered to be 12 ounces (355 mL) of beer, 5 ounces (148 mL) of wine, or 1.5 ounces (44 mL) of liquor.  Avoid use of street drugs. Do not share needles with anyone. Ask for help if you need support or instructions about stopping the use of drugs.  High blood pressure causes heart disease and increases the  risk of stroke. Your blood pressure should be checked at least every 1 to 2 years. Ongoing high blood pressure should be treated with medicines if weight loss and exercise are not effective.  If you are 51 to 60 years old, ask your caregiver if you should take aspirin to prevent strokes.  Diabetes screening involves taking a blood sample to check your fasting blood sugar level. This should be done once every 3 years, after age 73, if you are within normal weight and without risk factors  for diabetes. Testing should be considered at a younger age or be carried out more frequently if you are overweight and have at least 1 risk factor for diabetes.  Breast cancer screening is essential preventive care for women. You should practice "breast self-awareness." This means understanding the normal appearance and feel of your breasts and may include breast self-examination. Any changes detected, no matter how small, should be reported to a caregiver. Women in their 38s and 30s should have a clinical breast exam (CBE) by a caregiver as part of a regular health exam every 1 to 3 years. After age 72, women should have a CBE every year. Starting at age 12, women should consider having a mammography (breast X-ray test) every year. Women who have a family history of breast cancer should talk to their caregiver about genetic screening. Women at a high risk of breast cancer should talk to their caregivers about having magnetic resonance imaging (MRI) and a mammography every year.  The Pap test is a screening test for cervical cancer. A Pap test can show cell changes on the cervix that might become cervical cancer if left untreated. A Pap test is a procedure in which cells are obtained and examined from the lower end of the uterus (cervix).  Women should have a Pap test starting at age 53.  Between ages 74 and 16, Pap tests should be repeated every 2 years.  Beginning at age 19, you should have a Pap test every 3 years as long as the past 3 Pap tests have been normal.  Some women have medical problems that increase the chance of getting cervical cancer. Talk to your caregiver about these problems. It is especially important to talk to your caregiver if a new problem develops soon after your last Pap test. In these cases, your caregiver may recommend more frequent screening and Pap tests.  The above recommendations are the same for women who have or have not gotten the vaccine for human papillomavirus  (HPV).  If you had a hysterectomy for a problem that was not cancer or a condition that could lead to cancer, then you no longer need Pap tests. Even if you no longer need a Pap test, a regular exam is a good idea to make sure no other problems are starting.  If you are between ages 65 and 31, and you have had normal Pap tests going back 10 years, you no longer need Pap tests. Even if you no longer need a Pap test, a regular exam is a good idea to make sure no other problems are starting.  If you have had past treatment for cervical cancer or a condition that could lead to cancer, you need Pap tests and screening for cancer for at least 20 years after your treatment.  If Pap tests have been discontinued, risk factors (such as a new sexual partner) need to be reassessed to determine if screening should be resumed.  The HPV test is  an additional test that may be used for cervical cancer screening. The HPV test looks for the virus that can cause the cell changes on the cervix. The cells collected during the Pap test can be tested for HPV. The HPV test could be used to screen women aged 33 years and older, and should be used in women of any age who have unclear Pap test results. After the age of 46, women should have HPV testing at the same frequency as a Pap test.  Colorectal cancer can be detected and often prevented. Most routine colorectal cancer screening begins at the age of 49 and continues through age 45. However, your caregiver may recommend screening at an earlier age if you have risk factors for colon cancer. On a yearly basis, your caregiver may provide home test kits to check for hidden blood in the stool. Use of a small camera at the end of a tube, to directly examine the colon (sigmoidoscopy or colonoscopy), can detect the earliest forms of colorectal cancer. Talk to your caregiver about this at age 47, when routine screening begins. Direct examination of the colon should be repeated every 5  to 10 years through age 10, unless early forms of pre-cancerous polyps or small growths are found.  Hepatitis C blood testing is recommended for all people born from [birth year] through 1965 and any individual with known risks for hepatitis C.  Practice safe sex. Use condoms and avoid high-risk sexual practices to reduce the spread of sexually transmitted infections (STIs). STIs include gonorrhea, chlamydia, syphilis, trichomonas, herpes, HPV, and human immunodeficiency virus (HIV). Herpes, HIV, and HPV are viral illnesses that have no cure. They can result in disability, cancer, and death. Sexually active women aged 30 and younger should be checked for chlamydia. Older women with new or multiple partners should also be tested for chlamydia. Testing for other STIs is recommended if you are sexually active and at increased risk.  Osteoporosis is a disease in which the bones lose minerals and strength with aging. This can result in serious bone fractures. The risk of osteoporosis can be identified using a bone density scan. Women ages 62 and over and women at risk for fractures or osteoporosis should discuss screening with their caregivers. Ask your caregiver whether you should take a calcium supplement or vitamin D to reduce the rate of osteoporosis.  Menopause can be associated with physical symptoms and risks. Hormone replacement therapy is available to decrease symptoms and risks. You should talk to your caregiver about whether hormone replacement therapy is right for you.  Use sunscreen with sun protection factor (SPF) of 30 or more. Apply sunscreen liberally and repeatedly throughout the day. You should seek shade when your shadow is shorter than you. Protect yourself by wearing long sleeves, pants, a wide-brimmed hat, and sunglasses year round, whenever you are outdoors.  Once a month, do a whole body skin exam, using a mirror to look at the skin on your back. Notify your caregiver of new moles, moles that  have irregular borders, moles that are larger than a pencil eraser, or moles that have changed in shape or color.  Stay current with required immunizations.  Influenza. You need a dose every fall (or winter). The composition of the flu vaccine changes each year, so being vaccinated once is not enough.  Pneumococcal polysaccharide. You need 1 to 2 doses if you smoke cigarettes or if you have certain chronic medical conditions. You need 1 dose at age 46 (or older)  if you have never been vaccinated.  Tetanus, diphtheria, pertussis (Tdap, Td). Get 1 dose of Tdap vaccine if you are younger than age 66, are over 18 and have contact with an infant, are a Research scientist (physical sciences), are pregnant, or simply want to be protected from whooping cough. After that, you need a Td booster dose every 10 years. Consult your caregiver if you have not had at least 3 tetanus and diphtheria-containing shots sometime in your life or have a deep or dirty wound.  HPV. You need this vaccine if you are a woman age 56 or younger. The vaccine is given in 3 doses over 6 months.  Measles, mumps, rubella (MMR). You need at least 1 dose of MMR if you were born in 1957 or later. You may also need a second dose.  Meningococcal. If you are age 34 to 81 and a first-year college student living in a residence hall, or have one of several medical conditions, you need to get vaccinated against meningococcal disease. You may also need additional booster doses.  Zoster (shingles). If you are age 26 or older, you should get this vaccine.  Varicella (chickenpox). If you have never had chickenpox or you were vaccinated but received only 1 dose, talk to your caregiver to find out if you need this vaccine.  Hepatitis A. You need this vaccine if you have a specific risk factor for hepatitis A virus infection or you simply wish to be protected from this disease. The vaccine is usually given as 2 doses, 6 to 18 months apart.  Hepatitis B. You need this  vaccine if you have a specific risk factor for hepatitis B virus infection or you simply wish to be protected from this disease. The vaccine is given in 3 doses, usually over 6 months. Preventive Services / Frequency Ages 76 to 24  Blood pressure check.** / Every 1 to 2 years.  Lipid and cholesterol check.** / Every 5 years beginning at age 51.  Clinical breast exam.** / Every 3 years for women in their 75s and 30s.  Pap test.** / Every 2 years from ages 24 through 39. Every 3 years starting at age 60 through age 75 or 71 with a history of 3 consecutive normal Pap tests.  HPV screening.** / Every 3 years from ages 35 through ages 19 to 83 with a history of 3 consecutive normal Pap tests.  Hepatitis C blood test.** / For any individual with known risks for hepatitis C.  Skin self-exam. / Monthly.  Influenza immunization.** / Every year.  Pneumococcal polysaccharide immunization.** / 1 to 2 doses if you smoke cigarettes or if you have certain chronic medical conditions.  Tetanus, diphtheria, pertussis (Tdap, Td) immunization. / A one-time dose of Tdap vaccine. After that, you need a Td booster dose every 10 years.  HPV immunization. / 3 doses over 6 months, if you are 74 and younger.  Measles, mumps, rubella (MMR) immunization. / You need at least 1 dose of MMR if you were born in 1957 or later. You may also need a second dose.  Meningococcal immunization. / 1 dose if you are age 33 to 55 and a first-year college student living in a residence hall, or have one of several medical conditions, you need to get vaccinated against meningococcal disease. You may also need additional booster doses.  Varicella immunization.** / Consult your caregiver.  Hepatitis A immunization.** / Consult your caregiver. 2 doses, 6 to 18 months apart.  Hepatitis B immunization.** /  Consult your caregiver. 3 doses usually over 6 months. Ages 63 to 11  Blood pressure check.** / Every 1 to 2 years.  Lipid  and cholesterol check.** / Every 5 years beginning at age 59.  Clinical breast exam.** / Every year after age 50.  Mammogram.** / Every year beginning at age 65 and continuing for as long as you are in good health. Consult with your caregiver.  Pap test.** / Every 3 years starting at age 70 through age 63 or 11 with a history of 3 consecutive normal Pap tests.  HPV screening.** / Every 3 years from ages 74 through ages 64 to 31 with a history of 3 consecutive normal Pap tests.  Fecal occult blood test (FOBT) of stool. / Every year beginning at age 37 and continuing until age 8. You may not need to do this test if you get a colonoscopy every 10 years.  Flexible sigmoidoscopy or colonoscopy.** / Every 5 years for a flexible sigmoidoscopy or every 10 years for a colonoscopy beginning at age 49 and continuing until age 46.  Hepatitis C blood test.** / For all people born from [birth year] through 1963 and any individual with known risks for hepatitis C.  Skin self-exam. / Monthly.  Influenza immunization.** / Every year.  Pneumococcal polysaccharide immunization.** / 1 to 2 doses if you smoke cigarettes or if you have certain chronic medical conditions.  Tetanus, diphtheria, pertussis (Tdap, Td) immunization.** / A one-time dose of Tdap vaccine. After that, you need a Td booster dose every 10 years.  Measles, mumps, rubella (MMR) immunization. / You need at least 1 dose of MMR if you were born in 1957 or later. You may also need a second dose.  Varicella immunization.** / Consult your caregiver.  Meningococcal immunization.** / Consult your caregiver.  Hepatitis A immunization.** / Consult your caregiver. 2 doses, 6 to 18 months apart.  Hepatitis B immunization.** / Consult your caregiver. 3 doses, usually over 6 months. Ages 50 and over  Blood pressure check.** / Every 1 to 2 years.  Lipid and cholesterol check.** / Every 5 years beginning at age 35.  Clinical breast exam.** / Every year  after age 45.  Mammogram.** / Every year beginning at age 25 and continuing for as long as you are in good health. Consult with your caregiver.  Pap test.** / Every 3 years starting at age 85 through age 45 or 32 with a 3 consecutive normal Pap tests. Testing can be stopped between 65 and 70 with 3 consecutive normal Pap tests and no abnormal Pap or HPV tests in the past 10 years.  HPV screening.** / Every 3 years from ages 44 through ages 54 or 60 with a history of 3 consecutive normal Pap tests. Testing can be stopped between 65 and 70 with 3 consecutive normal Pap tests and no abnormal Pap or HPV tests in the past 10 years.  Fecal occult blood test (FOBT) of stool. / Every year beginning at age 8 and continuing until age 68. You may not need to do this test if you get a colonoscopy every 10 years.  Flexible sigmoidoscopy or colonoscopy.** / Every 5 years for a flexible sigmoidoscopy or every 10 years for a colonoscopy beginning at age 11 and continuing until age 24.  Hepatitis C blood test.** / For all people born from [birth year] through 1963 and any individual with known risks for hepatitis C.  Osteoporosis screening.** / A one-time screening for women ages 44 and over  and women at risk for fractures or osteoporosis.  Skin self-exam. / Monthly.  Influenza immunization.** / Every year.  Pneumococcal polysaccharide immunization.** / 1 dose at age 86 (or older) if you have never been vaccinated.  Tetanus, diphtheria, pertussis (Tdap, Td) immunization. / A one-time dose of Tdap vaccine if you are over 65 and have contact with an infant, are a Research scientist (physical sciences), or simply want to be protected from whooping cough. After that, you need a Td booster dose every 10 years.  Varicella immunization.** / Consult your caregiver.  Meningococcal immunization.** / Consult your caregiver.  Hepatitis A immunization.** / Consult your caregiver. 2 doses, 6 to 18 months apart.  Hepatitis B immunization.** /  Check with your caregiver. 3 doses, usually over 6 months. ** Family history and personal history of risk and conditions may change your caregiver's recommendations. Document Released: 01/18/2002 Document Revised: 02/14/2012 Document Reviewed: 04/19/2011 [facility] Patient Information 2014 [place], [place].

## 2014-01-09 ENCOUNTER — Other Ambulatory Visit: Payer: Self-pay | Admitting: Internal Medicine

## 2014-01-09 DIAGNOSIS — Z1231 Encounter for screening mammogram for malignant neoplasm of breast: Secondary | ICD-10-CM

## 2014-01-17 ENCOUNTER — Ambulatory Visit (HOSPITAL_COMMUNITY)
Admission: RE | Admit: 2014-01-17 | Discharge: 2014-01-17 | Disposition: A | Discharge status: Home / Self Care | Payer: BC Managed Care – PPO | Source: Ambulatory Visit | Attending: Internal Medicine | Admitting: Internal Medicine

## 2014-01-17 DIAGNOSIS — Z1231 Encounter for screening mammogram for malignant neoplasm of breast: Secondary | ICD-10-CM

## 2014-08-13 ENCOUNTER — Encounter: Payer: Self-pay | Admitting: Internal Medicine

## 2014-08-14 ENCOUNTER — Encounter: Payer: Self-pay | Admitting: Internal Medicine

## 2014-09-11 ENCOUNTER — Ambulatory Visit (AMBULATORY_SURGERY_CENTER): Payer: Self-pay

## 2014-09-11 VITALS — Ht 66.0 in | Wt 238.8 lb

## 2014-09-11 DIAGNOSIS — Z800 Family history of malignant neoplasm of digestive organs: Secondary | ICD-10-CM

## 2014-09-11 MED ORDER — MOVIPREP 100 G PO SOLR: 1.0000 | Freq: Once | ORAL | Status: DC

## 2014-09-11 NOTE — Progress Notes (Signed)
No allergies to eggs or soy No past problems with anesthesia No home oxygen No diet/weight loss meds  Has email  Emmi instructions given for colonoscopy 

## 2014-09-17 ENCOUNTER — Encounter: Payer: Self-pay | Admitting: Internal Medicine

## 2014-09-25 ENCOUNTER — Ambulatory Visit (AMBULATORY_SURGERY_CENTER): Payer: BC Managed Care – PPO | Admitting: Internal Medicine

## 2014-09-25 ENCOUNTER — Encounter: Payer: Self-pay | Admitting: Internal Medicine

## 2014-09-25 VITALS — BP 121/77 | HR 74 | Temp 99.3°F | Resp 18 | Ht 66.0 in | Wt 238.0 lb

## 2014-09-25 DIAGNOSIS — Z1211 Encounter for screening for malignant neoplasm of colon: Secondary | ICD-10-CM

## 2014-09-25 DIAGNOSIS — Z800 Family history of malignant neoplasm of digestive organs: Secondary | ICD-10-CM

## 2014-09-25 MED ORDER — SODIUM CHLORIDE 0.9 % IV SOLN: 500.0000 mL | INTRAVENOUS | Status: DC

## 2014-09-25 NOTE — Progress Notes (Signed)
Pt ate ice chips and 2 small mints to help with throat discomfort; states that this is helping

## 2014-09-25 NOTE — Op Note (Signed)
[facility]  [address] [place], [id number]   COLONOSCOPY PROCEDURE REPORT  PATIENT: Natasha [name], Natasha [name]  MR#: [id number] BIRTHDATE: 09/30/1951 , 63  yrs. old GENDER: female ENDOSCOPIST: Lafayette Dragon, MD REFERRED [id number] Darnelle Going, M.D. PROCEDURE DATE:  09/25/2014 PROCEDURE:   Colonoscopy, screening First Screening Colonoscopy - Avg.  risk and is 50 yrs.  old or older - No.  Prior Negative Screening - Now for repeat screening. 10 or more years since last screening  History of Adenoma - Now for follow-up colonoscopy & has been > or = to 3 yrs.  N/A  Polyps Removed Today? No. ASA CLASS:   Class II INDICATIONS:patient's immediate family history of colon cancer , last colonoscopy 2004 was normal.. MEDICATIONS: Monitored anesthesia care and Propofol 330 mg IV  DESCRIPTION OF PROCEDURE:   After the risks benefits and alternatives of the procedure were thoroughly explained, informed consent was obtained.  The digital rectal exam revealed no abnormalities of the rectum.   The [id number]  endoscope was introduced through the anus and advanced to the cecum, which was identified by both the appendix and ileocecal valve. No adverse events experienced.   The quality of the prep was good, using MoviPrep  The instrument was then slowly withdrawn as the colon was fully examined.      COLON FINDINGS: There was moderate diverticulosis noted in the sigmoid colon with associated muscular hypertrophy, colonic narrowing, angulation and tortuosity.  Retroflexed views revealed no abnormalities. The time to cecum=16 minutes 34 seconds. Withdrawal time=6 minutes 05 seconds.  The scope was withdrawn and the procedure completed. COMPLICATIONS: There were no immediate complications.  ENDOSCOPIC IMPRESSION: There was moderate diverticulosis noted in the sigmoid colon,narrow sigmoid colon difficult to traverse  RECOMMENDATIONS: High-fiber diet Benefiber 1 tablespoon daily Recall  colonoscopy in 5 years  eSigned:  Lafayette Dragon, MD 09/25/2014 2:10 PM   cc:

## 2014-09-25 NOTE — Patient Instructions (Signed)
YOU HAD AN ENDOSCOPIC PROCEDURE TODAY AT [facility]: Refer to the procedure report that was given to you for any specific questions about what was found during the examination.  If the procedure report does not answer your questions, please call your gastroenterologist to clarify.  If you requested that your care partner not be given the details of your procedure findings, then the procedure report has been included in a sealed envelope for you to review at your convenience later.  YOU SHOULD EXPECT: Some feelings of bloating in the abdomen. Passage of more gas than usual.  Walking can help get rid of the air that was put into your GI tract during the procedure and reduce the bloating. If you had a lower endoscopy (such as a colonoscopy or flexible sigmoidoscopy) you may notice spotting of blood in your stool or on the toilet paper. If you underwent a bowel prep for your procedure, then you may not have a normal bowel movement for a few days.  DIET: Your first meal following the procedure should be a light meal and then it is ok to progress to your normal diet.  A half-sandwich or bowl of soup is an example of a good first meal.  Heavy or fried foods are harder to digest and may make you feel nauseous or bloated.  Likewise meals heavy in dairy and vegetables can cause extra gas to form and this can also increase the bloating.  Drink plenty of fluids but you should avoid alcoholic beverages for 24 hours.  ACTIVITY: Your care partner should take you home directly after the procedure.  You should plan to take it easy, moving slowly for the rest of the day.  You can resume normal activity the day after the procedure however you should NOT DRIVE or use heavy machinery for 24 hours (because of the sedation medicines used during the test).    SYMPTOMS TO REPORT IMMEDIATELY: A gastroenterologist can be reached at any hour.  During normal business hours, 8:30 AM to 5:00 PM Monday through Friday,  call [phone].  After hours and on weekends, please call the GI answering service at [phone] who will take a message and have the physician on call contact you.   Following lower endoscopy (colonoscopy or flexible sigmoidoscopy):  Excessive amounts of blood in the stool  Significant tenderness or worsening of abdominal pains  Swelling of the abdomen that is new, acute  Fever of 100F or higher  FOLLOW UP: Our staff will call the home number listed on your records the next business day following your procedure to check on you and address any questions or concerns that you may have at that time regarding the information given to you following your procedure. This is a courtesy call and so if there is no answer at the home number and we have not heard from you through the emergency physician on call, we will assume that you have returned to your regular daily activities without incident.  SIGNATURES/CONFIDENTIALITY: You and/or your care partner have signed paperwork which will be entered into your electronic medical record.  These signatures attest to the fact that that the information above on your After Visit Summary has been reviewed and is understood.  Full responsibility of the confidentiality of this discharge information lies with you and/or your care-partner.  Continue your normal medications  Next colonoscopy in 5 years  Please read over handouts about diverticulosis and high fiber diets  Benefiber 1 tablespoon daily

## 2014-09-25 NOTE — Progress Notes (Signed)
Pt c/o "sore throat" after procedure; continuously asking for a life saver.  I told her and her husband that she may not have a life saver yet because I don't want her to doze off and choke on candy.  Dr. Olevia Perches at bedside now and discussing this with pt and husband

## 2014-09-25 NOTE — Progress Notes (Signed)
Pt still c/o sore throat at D/C but states "it is much better now."

## 2014-09-25 NOTE — Progress Notes (Signed)
Report to PACU, RN, vss, BBS= Clear.  

## 2014-09-26 ENCOUNTER — Telehealth: Payer: Self-pay | Admitting: *Deleted

## 2014-09-26 NOTE — Telephone Encounter (Signed)
  Follow up Call-  Call back number 09/25/2014  Post procedure Call Back phone  # [phone]  Permission to leave phone message Yes     Patient questions:  Do you have a fever, pain , or abdominal swelling? Yes.  Sore throat Pain Score  3 *  Have you tolerated food without any problems? Yes.  able to eat chicken  Have you been able to return to your normal activities? Yes.    Do you have any questions about your discharge instructions: Diet   No. Medications  No. Follow up visit  No.  Do you have questions or concerns about your Care? No.  Actions: * If pain score is 4 or above: No action needed, pain <4.

## 2016-07-28 NOTE — Progress Notes (Signed)
Pre visit review using our clinic review tool, if applicable. No additional management support is needed unless otherwise documented below in the visit note.  Chief Complaint  Patient presents with  . Sciatica    Rt side buttock pain and thigh pain.  Has tingling in both feet.    HPI: Natasha [name] 65 y.o. onset since May of right buttocks pain radiates down the back of her leg but not all the way to her foot. Is affected her ability to be active. It is worse with walking and prolonged walking better with rest and night. No specific trauma fever night sweats or weight loss. She does have a family history of spinal stenosis. She's also had some tingling in both feet without significant pain. Not thinking it's related to the buttocks pain. Her mom had MS and wants to bring that up she has labs due soon for her checkup. No history of personal of diabetes. Of interest she also had injury itching on her right lateral thigh for a couple years before all of this started. ROS: See pertinent positives and negatives per HPI. No weakness or falling.  Past Medical History:  Diagnosis Date  . Allergic rhinitis   . Normal cardiac stress test     Family History  Problem Relation Age of Onset  . Lung disease Father   . Cancer Father     Colon  . Colon polyps Father   . Multiple sclerosis Mother   . Scleroderma Sister   . Parkinson's disease Maternal Grandmother   . Colon cancer Neg Hx     Social History   Social History  . Marital status: Married    Spouse name: N/A  . Number of children: N/A  . Years of education: N/A   Social History Main Topics  . Smoking status: Never Smoker  . Smokeless tobacco: Never Used  . Alcohol use 0.5 oz/week    1 drink(s) per week     Comment: or 1 every 2 weeks  . Drug use: No  . Sexual activity: Not Asked   Other Topics Concern  . 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    No tob    No outpatient prescriptions prior to visit.   No facility-administered medications prior to visit.      EXAM:  BP 122/72 (BP Location: Right Arm, Patient Position: Sitting, Cuff Size: Large)   Temp 97.6 F (36.4 C) (Oral)   Ht 5\' 6"  (1.676 m)   Wt 230 lb 6.4 oz (104.5 kg)   BMI 37.19 kg/m   Body mass index is 37.19 kg/m.  GENERAL: vitals reviewed and listed above, alert, oriented, appears well hydrated and in no acute distress HEENT: atraumatic, conjunctiva  clear, no obvious abnormalities on inspection of external nose and ears NECK: no obvious masses on inspection palpation   CV: HRRR, no clubbing cyanosis or  peripheral edema nl cap refill  MS: moves all extremities without noticeable focal  abnormality gait is within normal limits and no midline tenderness negative SLR toe heel walk is normal. Points to the area of the right buttocks. His feet appear within normal limits and position sense is normal and to light touch. No acute findings. DTRs are equal bilaterally lower extremity. No clonus. PSYCH: pleasant and cooperative, no obvious depression or anxiety  ASSESSMENT AND PLAN:  Discussed the following assessment and plan:  Tingling - both feet  no sig pain add a1c and b12 to cpx labs  - Plan: Hemoglobin A1c, Vitamin B12  Right buttock pain  Need for prophylactic vaccination and inoculation against influenza - Plan: Flu Vaccine QUAD 36+ mos PF IM (Fluarix & Fluzone Quad PF)  lab for cpx  - Plan: Basic metabolic panel, CBC with Differential/Platelet, Lipid panel, TSH, Hepatic function panel Mom had ms  No neuro findings   On exam today  Buttock pain not radiation to feet   sciatica type symptoms although piriformis is possible. Differential diagnosis discussed physical modalities can consider physical therapy etc. Try anti-inflammatories for 1-2 weeks do stretching exercises for piriformis problem. Plan  adding on hemoglobin A1c and B12 to your CPX labs and follow-up in September. Consideration of seeing sports medicine if persistent symptoms. -Patient advised to return or notify health care team  if symptoms worsen ,persist or new concerns arise.  Patient Instructions  Sciatic pain   Without weakness     Is reasuring and not down to your foot .   Try aleve  Every day  For 10 - 14  days .   And then as needed  Will add   sometimes physical modalities such as PT  Sometimes chiro   and stretching  sports medicine  eval can   Will get add on a1c and b12 to your labs .    Sciatica Sciatica is pain, weakness, numbness, or tingling along the path of the sciatic nerve. The nerve starts in the lower back and runs down the back of each leg. The nerve controls the muscles in the lower leg and in the back of the knee, while also providing sensation to the back of the thigh, lower leg, and the sole of your foot. Sciatica is a symptom of another medical condition. For instance, nerve damage or certain conditions, such as a herniated disk or bone spur on the spine, pinch or put pressure on the sciatic nerve. This causes the pain, weakness, or other sensations normally associated with sciatica. Generally, sciatica only affects one side of the body. CAUSES   Herniated or slipped disc.  Degenerative disk disease.  A pain disorder involving the narrow muscle in the buttocks (piriformis syndrome).  Pelvic injury or fracture.  Pregnancy.  Tumor (rare). SYMPTOMS  Symptoms can vary from mild to very severe. The symptoms usually travel from the low back to the buttocks and down the back of the leg. Symptoms can include:  Mild tingling or dull aches in the lower back, leg, or hip.  Numbness in the back of the calf or sole of the foot.  Burning sensations in the lower back, leg, or hip.  Sharp pains in the lower back, leg, or hip.  Leg weakness.  Severe back pain inhibiting movement. These symptoms  may get worse with coughing, sneezing, laughing, or prolonged sitting or standing. Also, being overweight may worsen symptoms. DIAGNOSIS  Your caregiver will perform a physical exam to look for common symptoms of sciatica. He or she may ask you to do certain movements or activities that would trigger sciatic nerve pain. Other tests may be performed to find the cause of the sciatica. These may include:  Blood tests.  X-rays.  Imaging tests, such as an MRI or CT scan. TREATMENT  Treatment is directed at the cause of the sciatic pain. Sometimes, treatment is not necessary and the pain and discomfort  goes away on its own. If treatment is needed, your caregiver may suggest:  Over-the-counter medicines to relieve pain.  Prescription medicines, such as anti-inflammatory medicine, muscle relaxants, or narcotics.  Applying heat or ice to the painful area.  Steroid injections to lessen pain, irritation, and inflammation around the nerve.  Reducing activity during periods of pain.  Exercising and stretching to strengthen your abdomen and improve flexibility of your spine. Your caregiver may suggest losing weight if the extra weight makes the back pain worse.  Physical therapy.  Surgery to eliminate what is pressing or pinching the nerve, such as a bone spur or part of a herniated disk. HOME CARE INSTRUCTIONS   Only take over-the-counter or prescription medicines for pain or discomfort as directed by your caregiver.  Apply ice to the affected area for 20 minutes, 3-4 times a day for the first 48-72 hours. Then try heat in the same way.  Exercise, stretch, or perform your usual activities if these do not aggravate your pain.  Attend physical therapy sessions as directed by your caregiver.  Keep all follow-up appointments as directed by your caregiver.  Do not wear high heels or shoes that do not provide proper support.  Check your mattress to see if it is too soft. A firm mattress may  lessen your pain and discomfort. SEEK IMMEDIATE MEDICAL CARE IF:   You lose control of your bowel or bladder (incontinence).  You have increasing weakness in the lower back, pelvis, buttocks, or legs.  You have redness or swelling of your back.  You have a burning sensation when you urinate.  You have pain that gets worse when you lie down or awakens you at night.  Your pain is worse than you have experienced in the past.  Your pain is lasting longer than 4 weeks.  You are suddenly losing weight without reason. MAKE SURE YOU:  Understand these instructions.  Will watch your condition.  Will get help right away if you are not doing well or get worse.   This information is not intended to replace advice given to you by your health care provider. Make sure you discuss any questions you have with your health care provider.   Document Released: 11/16/2001 Document Revised: 08/13/2015 Document Reviewed: 04/02/2012 Elsevier Interactive Patient Education 2016 [place] K. Panosh M.D.

## 2016-07-29 ENCOUNTER — Ambulatory Visit (INDEPENDENT_AMBULATORY_CARE_PROVIDER_SITE_OTHER): Payer: BLUE CROSS/BLUE SHIELD | Admitting: Internal Medicine

## 2016-07-29 ENCOUNTER — Encounter: Payer: Self-pay | Admitting: Internal Medicine

## 2016-07-29 VITALS — BP 122/72 | Temp 97.6°F | Ht 66.0 in | Wt 230.4 lb

## 2016-07-29 DIAGNOSIS — M791 Myalgia: Secondary | ICD-10-CM

## 2016-07-29 DIAGNOSIS — R202 Paresthesia of skin: Secondary | ICD-10-CM

## 2016-07-29 DIAGNOSIS — Z23 Encounter for immunization: Secondary | ICD-10-CM | POA: Diagnosis not present

## 2016-07-29 DIAGNOSIS — Z0000 Encounter for general adult medical examination without abnormal findings: Secondary | ICD-10-CM | POA: Diagnosis not present

## 2016-07-29 DIAGNOSIS — M7918 Myalgia, other site: Secondary | ICD-10-CM

## 2016-07-29 NOTE — Patient Instructions (Signed)
Sciatic pain   Without weakness     Is reasuring and not down to your foot .   Try aleve  Every day  For 10 - 14  days .   And then as needed  Will add   sometimes physical modalities such as PT  Sometimes chiro   and stretching  sports medicine  eval can   Will get add on a1c and b12 to your labs .    Sciatica Sciatica is pain, weakness, numbness, or tingling along the path of the sciatic nerve. The nerve starts in the lower back and runs down the back of each leg. The nerve controls the muscles in the lower leg and in the back of the knee, while also providing sensation to the back of the thigh, lower leg, and the sole of your foot. Sciatica is a symptom of another medical condition. For instance, nerve damage or certain conditions, such as a herniated disk or bone spur on the spine, pinch or put pressure on the sciatic nerve. This causes the pain, weakness, or other sensations normally associated with sciatica. Generally, sciatica only affects one side of the body. CAUSES   Herniated or slipped disc.  Degenerative disk disease.  A pain disorder involving the narrow muscle in the buttocks (piriformis syndrome).  Pelvic injury or fracture.  Pregnancy.  Tumor (rare). SYMPTOMS  Symptoms can vary from mild to very severe. The symptoms usually travel from the low back to the buttocks and down the back of the leg. Symptoms can include:  Mild tingling or dull aches in the lower back, leg, or hip.  Numbness in the back of the calf or sole of the foot.  Burning sensations in the lower back, leg, or hip.  Sharp pains in the lower back, leg, or hip.  Leg weakness.  Severe back pain inhibiting movement. These symptoms may get worse with coughing, sneezing, laughing, or prolonged sitting or standing. Also, being overweight may worsen symptoms. DIAGNOSIS  Your caregiver will perform a physical exam to look for common symptoms of sciatica. He or she may ask you to do certain movements or  activities that would trigger sciatic nerve pain. Other tests may be performed to find the cause of the sciatica. These may include:  Blood tests.  X-rays.  Imaging tests, such as an MRI or CT scan. TREATMENT  Treatment is directed at the cause of the sciatic pain. Sometimes, treatment is not necessary and the pain and discomfort goes away on its own. If treatment is needed, your caregiver may suggest:  Over-the-counter medicines to relieve pain.  Prescription medicines, such as anti-inflammatory medicine, muscle relaxants, or narcotics.  Applying heat or ice to the painful area.  Steroid injections to lessen pain, irritation, and inflammation around the nerve.  Reducing activity during periods of pain.  Exercising and stretching to strengthen your abdomen and improve flexibility of your spine. Your caregiver may suggest losing weight if the extra weight makes the back pain worse.  Physical therapy.  Surgery to eliminate what is pressing or pinching the nerve, such as a bone spur or part of a herniated disk. HOME CARE INSTRUCTIONS   Only take over-the-counter or prescription medicines for pain or discomfort as directed by your caregiver.  Apply ice to the affected area for 20 minutes, 3-4 times a day for the first 48-72 hours. Then try heat in the same way.  Exercise, stretch, or perform your usual activities if these do not aggravate your pain.  Attend physical therapy sessions as directed by your caregiver.  Keep all follow-up appointments as directed by your caregiver.  Do not wear high heels or shoes that do not provide proper support.  Check your mattress to see if it is too soft. A firm mattress may lessen your pain and discomfort. SEEK IMMEDIATE MEDICAL CARE IF:   You lose control of your bowel or bladder (incontinence).  You have increasing weakness in the lower back, pelvis, buttocks, or legs.  You have redness or swelling of your back.  You have a burning  sensation when you urinate.  You have pain that gets worse when you lie down or awakens you at night.  Your pain is worse than you have experienced in the past.  Your pain is lasting longer than 4 weeks.  You are suddenly losing weight without reason. MAKE SURE YOU:  Understand these instructions.  Will watch your condition.  Will get help right away if you are not doing well or get worse.   This information is not intended to replace advice given to you by your health care provider. Make sure you discuss any questions you have with your health care provider.   Document Released: 11/16/2001 Document Revised: 08/13/2015 Document Reviewed: 04/02/2012 Elsevier Interactive Patient Education [employer].

## 2016-08-18 ENCOUNTER — Other Ambulatory Visit (INDEPENDENT_AMBULATORY_CARE_PROVIDER_SITE_OTHER): Payer: BLUE CROSS/BLUE SHIELD

## 2016-08-18 DIAGNOSIS — Z0000 Encounter for general adult medical examination without abnormal findings: Secondary | ICD-10-CM

## 2016-08-18 DIAGNOSIS — R202 Paresthesia of skin: Secondary | ICD-10-CM

## 2016-08-18 LAB — HEPATIC FUNCTION PANEL
ALBUMIN: 4.1 g/dL (ref 3.5–5.2)
ALK PHOS: 87 U/L (ref 39–117)
ALT: 10 U/L (ref 0–35)
AST: 12 U/L (ref 0–37)
BILIRUBIN DIRECT: 0.1 mg/dL (ref 0.0–0.3)
Total Bilirubin: 0.6 mg/dL (ref 0.2–1.2)
Total Protein: 6.9 g/dL (ref 6.0–8.3)

## 2016-08-18 LAB — BASIC METABOLIC PANEL
BUN: 18 mg/dL (ref 6–23)
CO2: 28 mEq/L (ref 19–32)
Calcium: 9.1 mg/dL (ref 8.4–10.5)
Chloride: 107 mEq/L (ref 96–112)
Creatinine, Ser: 0.92 mg/dL (ref 0.40–1.20)
GFR: 65.54 mL/min (ref >60.00)
GLUCOSE: 97 mg/dL (ref 70–99)
POTASSIUM: 3.9 meq/L (ref 3.5–5.1)
Sodium: 142 mEq/L (ref 135–145)

## 2016-08-18 LAB — HEMOGLOBIN A1C: Hgb A1c MFr Bld: 5 % (ref 4.6–6.5)

## 2016-08-18 LAB — LIPID PANEL
CHOL/HDL RATIO: 5
Cholesterol: 143 mg/dL (ref 0–200)
HDL: 30.5 mg/dL — AB (ref >39.00)
LDL CALC: 83 mg/dL (ref 0–99)
NONHDL: 112.44
Triglycerides: 146 mg/dL (ref 0.0–149.0)
VLDL: 29.2 mg/dL (ref 0.0–40.0)

## 2016-08-18 LAB — VITAMIN B12: VITAMIN B 12: 252 pg/mL (ref 211–911)

## 2016-08-18 LAB — CBC WITH DIFFERENTIAL/PLATELET
BASOS ABS: 0 10*3/uL (ref 0.0–0.1)
BASOS PCT: 0.5 % (ref 0.0–3.0)
Eosinophils Absolute: 0.6 10*3/uL (ref 0.0–0.7)
Eosinophils Relative: 10.3 % — ABNORMAL HIGH (ref 0.0–5.0)
HEMATOCRIT: 41.3 % (ref 36.0–46.0)
HEMOGLOBIN: 14.1 g/dL (ref 12.0–15.0)
LYMPHS PCT: 26.6 % (ref 12.0–46.0)
Lymphs Abs: 1.4 10*3/uL (ref 0.7–4.0)
MCHC: 34.1 g/dL (ref 30.0–36.0)
MCV: 93.5 fl (ref 78.0–100.0)
MONOS PCT: 9.8 % (ref 3.0–12.0)
Monocytes Absolute: 0.5 10*3/uL (ref 0.1–1.0)
NEUTROS ABS: 2.8 10*3/uL (ref 1.4–7.7)
Neutrophils Relative %: 52.8 % (ref 43.0–77.0)
PLATELETS: 231 10*3/uL (ref 150.0–400.0)
RBC: 4.41 Mil/uL (ref 3.87–5.11)
RDW: 13.1 % (ref 11.5–15.5)
WBC: 5.4 10*3/uL (ref 4.0–10.5)

## 2016-08-18 LAB — TSH: TSH: 3.06 u[IU]/mL (ref 0.35–4.50)

## 2016-08-25 ENCOUNTER — Ambulatory Visit (INDEPENDENT_AMBULATORY_CARE_PROVIDER_SITE_OTHER): Payer: BLUE CROSS/BLUE SHIELD | Admitting: Internal Medicine

## 2016-08-25 ENCOUNTER — Encounter: Payer: Self-pay | Admitting: Internal Medicine

## 2016-08-25 ENCOUNTER — Other Ambulatory Visit (HOSPITAL_COMMUNITY)
Admission: RE | Admit: 2016-08-25 | Discharge: 2016-08-25 | Disposition: A | Discharge status: Home / Self Care | Payer: BLUE CROSS/BLUE SHIELD | Source: Ambulatory Visit | Attending: Internal Medicine | Admitting: Internal Medicine

## 2016-08-25 VITALS — BP 116/80 | Temp 97.8°F | Ht 65.5 in

## 2016-08-25 DIAGNOSIS — R202 Paresthesia of skin: Secondary | ICD-10-CM | POA: Diagnosis not present

## 2016-08-25 DIAGNOSIS — R7989 Other specified abnormal findings of blood chemistry: Secondary | ICD-10-CM

## 2016-08-25 DIAGNOSIS — Z1151 Encounter for screening for human papillomavirus (HPV): Secondary | ICD-10-CM | POA: Diagnosis not present

## 2016-08-25 DIAGNOSIS — E538 Deficiency of other specified B group vitamins: Secondary | ICD-10-CM

## 2016-08-25 DIAGNOSIS — Z0000 Encounter for general adult medical examination without abnormal findings: Secondary | ICD-10-CM | POA: Diagnosis not present

## 2016-08-25 DIAGNOSIS — Z01419 Encounter for gynecological examination (general) (routine) without abnormal findings: Secondary | ICD-10-CM | POA: Insufficient documentation

## 2016-08-25 DIAGNOSIS — E785 Hyperlipidemia, unspecified: Secondary | ICD-10-CM | POA: Diagnosis not present

## 2016-08-25 LAB — VITAMIN B12: Vitamin B-12: 231 pg/mL (ref 211–911)

## 2016-08-25 LAB — FOLATE: FOLATE: 14.4 ng/mL (ref >5.9)

## 2016-08-25 MED ORDER — ZOSTER VACCINE LIVE 19400 UNT/0.65ML ~~LOC~~ SUSR: 0.6500 mL | Freq: Once | SUBCUTANEOUS | 0 refills | Status: AC

## 2016-08-25 NOTE — Progress Notes (Signed)
Pre visit review using our clinic review tool, if applicable. No additional management support is needed unless otherwise documented below in the visit note.  Chief Complaint  Patient presents with  . Annual Exam    HPI: Patient  Natasha [name]  65 y.o. comes in today for Preventive Health Care visit  No chagnes some better  Sciatica limiting plans on  Water exercise    Health Maintenance  Topic Date Due  . ZOSTAVAX  09/10/2013  . MAMMOGRAM  01/18/2016  . Hepatitis C Screening  08/25/2017 (Originally 1951-08-07)  . HIV Screening  08/25/2017 (Originally 09/10/1966)  . PAP SMEAR  10/02/2017 (Originally 10/03/2015)  . TETANUS/TDAP  04/11/2018  . COLONOSCOPY  09/26/2019  . INFLUENZA VACCINE  Completed   Health Maintenance Review LIFESTYLE:  Exercise:   Causes   Sciatica  sdo not a lot now worse when sits Tobacco/ETS:o Alcohol:  Little 1 per week  Sugar beverages:   Coffee  Sleep:  About  8 hours  Drug use: no HH of  3 Work: retired  Chiropractor on e evening a week     ROS: gets leg cramps at night  No weakness tinlgin in feet  No falling  GEN/ HEENT: No fever, significant weight changes sweats headaches vision problems hearing changes, CV/ PULM; No chest pain shortness of breath cough, syncope,edema  change in exercise tolerance. GI /GU: No adominal pain, vomiting, change in bowel habits. No blood in the stool. No significant GU symptoms. SKIN/HEME: ,no acute skin rashes suspicious lesions or bleeding. No lymphadenopathy, nodules, masses.  NEURO/ PSYCH:  Noweakness  No depression anxiety. IMM/ Allergy: No unusual infections.  Allergy .   REST of 12 system review negative except as per HPI   Past Medical History:  Diagnosis Date  . Allergic rhinitis   . Normal cardiac stress test     Past Surgical History:  Procedure Laterality Date  . MYRINGOTOMY     right 7 th grade    Family History  Problem Relation Age of Onset  . Lung disease Father   . Cancer Father       Colon  . Colon polyps Father   . Multiple sclerosis Mother   . Scleroderma Sister   . Parkinson's disease Maternal Grandmother   . Colon cancer Neg Hx   sis ter has severe sclerodemra   Sis died of od at young age had bad arthritis . Sis had jaw cancer ?   Social History   Social History  . Marital status: Married    Spouse name: N/A  . Number of children: N/A  . Years of education: N/A   Social History Main Topics  . Smoking status: Never Smoker  . Smokeless tobacco: Never Used  . Alcohol use 0.5 oz/week    1 drink(s) per week     Comment: or 1 every 2 weeks  . Drug use: No  . Sexual activity: Not Asked   Other Topics Concern  . 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    No tob    No outpatient prescriptions prior to visit.   No facility-administered medications prior to visit.      EXAM:  BP 116/80 (BP Location: Left Arm, Patient Position: Sitting, Cuff Size: Large)   Temp 97.8 F (36.6 C) (Oral)  Ht 5' 5.5" (1.664 m)   There is no height or weight on file to calculate BMI. Pt declined weight  Physical Exam: Vital signs reviewed PZW:CHEN is a well-developed well-nourished alert cooperative    who appearsr stated age in no acute distress.  HEENT: normocephalic atraumatic , Eyes: PERRL EOM's full, conjunctiva clear, Nares: paten,t no deformity discharge or tenderness., Ears: no deformity EAC's clear TMs with normal landmarks. Mouth: clear OP, no lesions, edema.  Moist mucous membranes. Dentition in adequate repair. NECK: supple without masses, thyromegaly or bruits. CHEST/PULM:  Clear to auscultation and percussion breath sounds equal no wheeze , rales or rhonchi. No chest wall deformities or tenderness.Breast: normal by inspection . No dimpling, discharge, masses, tenderness or discharge . CV: PMI is nondisplaced, S1 S2 no gallops,  murmurs, rubs. Peripheral pulses are full without delay.No JVD .  ABDOMEN: Bowel sounds normal nontender  No guard or rebound, no hepato splenomegal no CVA tenderness.  No hernia. Extremtities:  No clubbing cyanosis or edema, no acute joint swelling or redness no focal atrophy  Some vv no lesions ulcers  NEURO:  Oriented x3, cranial nerves 3-12 appear to be intact, no obvious focal weakness,gait within normal limits no abnormal reflexes or asymmetrical gross sense intact   Vibration not tested  SKIN: No acute rashes normal turgor, color, no bruising or petechiae. PSYCH: Oriented, good eye contact, no obvious depression anxiety, cognition and judgment appear normal. LN: no cervical axillary inguinal adenopathy Pelvic: NL ext GU, labia clear without lesions or rash . Vagina no lesions .Cervix: clear  UTERUS: Neg CMT Adnexa:  clear no masses . PAP done rectal neg smear negative    Lab Results  Component Value Date   WBC 5.4 08/18/2016   HGB 14.1 08/18/2016   HCT 41.3 08/18/2016   PLT 231.0 08/18/2016   GLUCOSE 97 08/18/2016   CHOL 143 08/18/2016   TRIG 146.0 08/18/2016   HDL 30.50 (L) 08/18/2016   LDLCALC 83 08/18/2016   ALT 10 08/18/2016   AST 12 08/18/2016   NA 142 08/18/2016   K 3.9 08/18/2016   CL 107 08/18/2016   CREATININE 0.92 08/18/2016   BUN 18 08/18/2016   CO2 28 08/18/2016   TSH 3.06 08/18/2016   HGBA1C 5.0 08/18/2016   Wt Readings from Last 3 Encounters:  07/29/16 230 lb 6.4 oz (104.5 kg)  09/25/14 238 lb (108 kg)  09/11/14 238 lb 12.8 oz (108.3 kg)   ASSESSMENT AND PLAN:  Discussed the following assessment and plan:  Routine general medical examination at a health care facility - Plan: PAP [[place]]  Encounter for routine gynecological examination - Plan: PAP [[place]]  HLD (hyperlipidemia)  Tingling - Plan: Vitamin B12, Methylmalonic acid, serum, Folate, Intrinsic Factor Antibodies  Low serum vitamin B12 - Plan: Vitamin B12, Methylmalonic acid,  serum, Folate, Intrinsic Factor Antibodies a1c and b 12  252   Vs  211lln  Can get mm and  Folate etc    Then plan on decision  b12 addition  Due for mammo   Patient Care Team: Madelin Headings, MD as PCP - General Patient Instructions  She noted the results of the repeat B12 analysis. Depending on these results and decide on whether to supplement with oral B12 were injections .  Sometimes tingling can be from b12 deficicney  Agree with  increasing activity water  Exercise is a good idea .  Get your mammogram Will notify you when pap results are available.  Healthy  lifestyle includes : At least 150 minutes of exercise weeks  , weight at healthy levels, which is usually   BMI 19-25. Avoid trans fats and processed foods;  Increase fresh fruits and veges to 5 servings per day. And avoid sweet beverages including tea and juice. Mediterranean diet with olive oil and nuts have been noted to be heart and brain healthy . Avoid tobacco products . Limit  alcohol to  7 per week for women and 14 servings for men.  Get adequate sleep . Wear seat belts . Don't text and drive .    Standley Brooking. Panosh M.D.

## 2016-08-25 NOTE — Patient Instructions (Addendum)
She noted the results of the repeat B12 analysis. Depending on these results and decide on whether to supplement with oral B12 were injections .  Sometimes tingling can be from b12 deficicney  Agree with  increasing activity water  Exercise is a good idea .  Get your mammogram Will notify you when pap results are available.  Healthy lifestyle includes : At least 150 minutes of exercise weeks  , weight at healthy levels, which is usually   BMI 19-25. Avoid trans fats and processed foods;  Increase fresh fruits and veges to 5 servings per day. And avoid sweet beverages including tea and juice. Mediterranean diet with olive oil and nuts have been noted to be heart and brain healthy . Avoid tobacco products . Limit  alcohol to  7 per week for women and 14 servings for men.  Get adequate sleep . Wear seat belts . Don't text and drive .

## 2016-08-26 LAB — CYTOLOGY - PAP

## 2016-08-28 LAB — METHYLMALONIC ACID, SERUM: METHYLMALONIC ACID, QUANT: 603 nmol/L — AB (ref 87–318)

## 2016-08-28 LAB — INTRINSIC FACTOR ANTIBODIES: Intrinsic Factor: NEGATIVE

## 2016-08-28 NOTE — Progress Notes (Signed)
Tell patient PAP is normal. HPV high  risk is negative  b12 about the same    Waiting for MMA result to come back  Ok to take oral b12 1000 per day

## 2016-08-31 ENCOUNTER — Telehealth: Payer: Self-pay | Admitting: Internal Medicine

## 2016-08-31 NOTE — Telephone Encounter (Signed)
Noted  

## 2016-08-31 NOTE — Telephone Encounter (Signed)
Pt is returning misty call °

## 2016-09-03 ENCOUNTER — Telehealth: Payer: Self-pay | Admitting: Internal Medicine

## 2016-09-03 NOTE — Telephone Encounter (Addendum)
Pt is calling misty back requesting blood work results. Pt does not want to wait until misty come back for the results

## 2016-09-06 ENCOUNTER — Telehealth: Payer: Self-pay | Admitting: *Deleted

## 2016-09-06 ENCOUNTER — Ambulatory Visit (INDEPENDENT_AMBULATORY_CARE_PROVIDER_SITE_OTHER): Payer: BLUE CROSS/BLUE SHIELD | Admitting: *Deleted

## 2016-09-06 DIAGNOSIS — E538 Deficiency of other specified B group vitamins: Secondary | ICD-10-CM

## 2016-09-06 MED ORDER — CYANOCOBALAMIN 1000 MCG/ML IJ SOLN
1000.0000 ug | INTRAMUSCULAR | Status: DC
  Administered 2016-09-06: 1000 ug via INTRAMUSCULAR

## 2016-09-06 NOTE — Telephone Encounter (Signed)
Patient walked in today requesting results of B-12 levels. Spoke with Dr. Regis Bill and confirmed note from 08/31/16 at 1:03pm:  Notes Recorded by Burnis Medin, MD on 08/31/2016 at 1:03 PM EDT mma is elevated and consistent with Low b12 in the body. Because you have the tingling ( uncertain if from the [id number] or not but want to be cautious) Advise 1000 mcg injection b 12 every week for 4 weeks and then q 4 weeks ( can also take oral to get levels up)   Plan recheck b12 level and cdiff In 3 months and then ov to decide if we can stay on oral med Instead of injection,.  I f patient agrees please proceed with plan  ----------------------------------------------------------------------------------------------------- Patient is aware of above message. Patient also has the below (B-12) injection schedule: -Oct. 2nd   (1/4)   (completed today)  -Oct. 9th    (2/4) -Oct. 16th  (3/4) -Oct. 23rd  (4/4) -Nov. 13th   (1/month) -Dec. 11th   (1/month)  -Jan. 9th   (10:15am with lab and 10:30am with Dr. Regis Bill) --- 3 month follow-up and B-12 level blood draw with lab   **Will route message to Dr. Regis Bill to input lab orders and review of day

## 2016-09-06 NOTE — Telephone Encounter (Signed)
Thanks you michele  Will put in orders for cbcdiff ( not c diff)  And vit b12 level future order

## 2016-09-07 NOTE — Telephone Encounter (Signed)
Do not see where the pt was called in my absence.  Will call pt.  See result note.

## 2016-09-13 ENCOUNTER — Ambulatory Visit (INDEPENDENT_AMBULATORY_CARE_PROVIDER_SITE_OTHER): Payer: BLUE CROSS/BLUE SHIELD | Admitting: Family Medicine

## 2016-09-13 DIAGNOSIS — E538 Deficiency of other specified B group vitamins: Secondary | ICD-10-CM

## 2016-09-13 MED ORDER — CYANOCOBALAMIN 1000 MCG/ML IJ SOLN
1000.0000 ug | Freq: Once | INTRAMUSCULAR | Status: AC
  Administered 2016-09-13: 1000 ug via INTRAMUSCULAR

## 2016-09-20 ENCOUNTER — Other Ambulatory Visit: Payer: Self-pay | Admitting: Internal Medicine

## 2016-09-20 ENCOUNTER — Ambulatory Visit (INDEPENDENT_AMBULATORY_CARE_PROVIDER_SITE_OTHER): Payer: BLUE CROSS/BLUE SHIELD | Admitting: Family Medicine

## 2016-09-20 DIAGNOSIS — Z1231 Encounter for screening mammogram for malignant neoplasm of breast: Secondary | ICD-10-CM

## 2016-09-20 DIAGNOSIS — E538 Deficiency of other specified B group vitamins: Secondary | ICD-10-CM | POA: Diagnosis not present

## 2016-09-20 MED ORDER — CYANOCOBALAMIN 1000 MCG/ML IJ SOLN
1000.0000 ug | Freq: Once | INTRAMUSCULAR | Status: AC
  Administered 2016-09-20: 1000 ug via INTRAMUSCULAR

## 2016-09-27 ENCOUNTER — Ambulatory Visit (INDEPENDENT_AMBULATORY_CARE_PROVIDER_SITE_OTHER): Payer: BLUE CROSS/BLUE SHIELD | Admitting: Family Medicine

## 2016-09-27 DIAGNOSIS — E538 Deficiency of other specified B group vitamins: Secondary | ICD-10-CM

## 2016-09-27 MED ORDER — CYANOCOBALAMIN 1000 MCG/ML IJ SOLN
1000.0000 ug | Freq: Once | INTRAMUSCULAR | Status: AC
  Administered 2016-09-27: 1000 ug via INTRAMUSCULAR

## 2016-09-29 ENCOUNTER — Ambulatory Visit
Admission: RE | Admit: 2016-09-29 | Discharge: 2016-09-29 | Disposition: A | Discharge status: Home / Self Care | Payer: BLUE CROSS/BLUE SHIELD | Source: Ambulatory Visit | Attending: Internal Medicine | Admitting: Internal Medicine

## 2016-09-29 DIAGNOSIS — Z1231 Encounter for screening mammogram for malignant neoplasm of breast: Secondary | ICD-10-CM

## 2016-10-04 ENCOUNTER — Telehealth: Payer: Self-pay | Admitting: Internal Medicine

## 2016-10-04 NOTE — Telephone Encounter (Signed)
Error/ltd ° °

## 2016-10-18 ENCOUNTER — Telehealth: Payer: Self-pay | Admitting: Family Medicine

## 2016-10-18 ENCOUNTER — Ambulatory Visit (INDEPENDENT_AMBULATORY_CARE_PROVIDER_SITE_OTHER): Payer: BLUE CROSS/BLUE SHIELD | Admitting: Internal Medicine

## 2016-10-18 DIAGNOSIS — E538 Deficiency of other specified B group vitamins: Secondary | ICD-10-CM

## 2016-10-18 MED ORDER — CYANOCOBALAMIN 1000 MCG/ML IJ SOLN
1000.0000 ug | Freq: Once | INTRAMUSCULAR | Status: AC
  Administered 2016-10-18: 1000 ug via INTRAMUSCULAR

## 2016-10-18 NOTE — Telephone Encounter (Signed)
Pt requesting to get vitamin b  12 level drawn, she thinks she might need to increase injections.

## 2016-10-19 NOTE — Telephone Encounter (Signed)
  Please advise    Why wants level ealire than planned    Ok to check but arrange about    3 weeks  From last shot  ( but can take oral ) cbcdiff and  b12 level .

## 2016-10-20 ENCOUNTER — Other Ambulatory Visit: Payer: Self-pay | Admitting: Family Medicine

## 2016-10-20 DIAGNOSIS — E538 Deficiency of other specified B group vitamins: Secondary | ICD-10-CM

## 2016-10-20 NOTE — Telephone Encounter (Signed)
Spoke to the pt.  She stated she is feeling fragile, weepy and is experiencing some fatigue between b12 injections.  Would like to recheck level.  Made appt for 11/08/16 @ 9:30 AM.   Will enter lab orders.

## 2016-11-08 ENCOUNTER — Other Ambulatory Visit (INDEPENDENT_AMBULATORY_CARE_PROVIDER_SITE_OTHER): Payer: BLUE CROSS/BLUE SHIELD

## 2016-11-08 DIAGNOSIS — E538 Deficiency of other specified B group vitamins: Secondary | ICD-10-CM | POA: Diagnosis not present

## 2016-11-08 LAB — CBC WITH DIFFERENTIAL/PLATELET
BASOS ABS: 0 10*3/uL (ref 0.0–0.1)
Basophils Relative: 0.8 % (ref 0.0–3.0)
EOS ABS: 0.5 10*3/uL (ref 0.0–0.7)
Eosinophils Relative: 10.9 % — ABNORMAL HIGH (ref 0.0–5.0)
HEMATOCRIT: 40.5 % (ref 36.0–46.0)
HEMOGLOBIN: 13.8 g/dL (ref 12.0–15.0)
LYMPHS PCT: 21.6 % (ref 12.0–46.0)
Lymphs Abs: 1 10*3/uL (ref 0.7–4.0)
MCHC: 33.9 g/dL (ref 30.0–36.0)
MCV: 92.8 fl (ref 78.0–100.0)
MONOS PCT: 8.3 % (ref 3.0–12.0)
Monocytes Absolute: 0.4 10*3/uL (ref 0.1–1.0)
NEUTROS ABS: 2.7 10*3/uL (ref 1.4–7.7)
Neutrophils Relative %: 58.4 % (ref 43.0–77.0)
PLATELETS: 244 10*3/uL (ref 150.0–400.0)
RBC: 4.36 Mil/uL (ref 3.87–5.11)
RDW: 13.4 % (ref 11.5–15.5)
WBC: 4.6 10*3/uL (ref 4.0–10.5)

## 2016-11-08 LAB — VITAMIN B12: VITAMIN B 12: 306 pg/mL (ref 211–911)

## 2016-11-15 ENCOUNTER — Ambulatory Visit (INDEPENDENT_AMBULATORY_CARE_PROVIDER_SITE_OTHER): Payer: BLUE CROSS/BLUE SHIELD | Admitting: Family Medicine

## 2016-11-15 DIAGNOSIS — E538 Deficiency of other specified B group vitamins: Secondary | ICD-10-CM

## 2016-11-15 MED ORDER — CYANOCOBALAMIN 1000 MCG/ML IJ SOLN
1000.0000 ug | Freq: Once | INTRAMUSCULAR | Status: AC
  Administered 2016-11-15: 1000 ug via INTRAMUSCULAR

## 2016-12-13 NOTE — Progress Notes (Deleted)
   No chief complaint on file.   HPI: Natasha [name] 66 y.o.  ROS: See pertinent positives and negatives per HPI.  Past Medical History:  Diagnosis Date  . Allergic rhinitis   . Normal cardiac stress test     Family History  Problem Relation Age of Onset  . Lung disease Father   . Cancer Father     Colon  . Colon polyps Father   . Multiple sclerosis Mother   . Scleroderma Sister   . Parkinson's disease Maternal Grandmother   . Colon cancer Neg Hx     Social History   Social History  . Marital status: Married    Spouse name: N/A  . Number of children: N/A  . Years of education: N/A   Social History Main Topics  . Smoking status: Never Smoker  . Smokeless tobacco: Never Used  . Alcohol use 0.5 oz/week    1 drink(s) per week     Comment: or 1 every 2 weeks  . Drug use: No  . Sexual activity: Not on file   Other Topics Concern  . 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    No tob    No outpatient prescriptions prior to visit.   Facility-Administered Medications Prior to Visit  Medication Dose Route Frequency Provider Last Rate Last Dose  . cyanocobalamin ((VITAMIN B-12)) injection 1,000 mcg  1,000 mcg Intramuscular Weekly Burnis Medin, MD   1,000 mcg at 09/06/16 1303     EXAM:  There were no vitals taken for this visit.  There is no height or weight on file to calculate BMI.  GENERAL: vitals reviewed and listed above, alert, oriented, appears well hydrated and in no acute distress HEENT: atraumatic, conjunctiva  clear, no obvious abnormalities on inspection of external nose and ears OP : no lesion edema or exudate  NECK: no obvious masses on inspection palpation  LUNGS: clear to auscultation bilaterally, no wheezes, rales or rhonchi, good air movement CV: HRRR, no clubbing cyanosis or  peripheral edema nl cap refill  MS:  moves all extremities without noticeable focal  abnormality PSYCH: pleasant and cooperative, no obvious depression or anxiety Lab Results  Component Value Date   WBC 4.6 11/08/2016   HGB 13.8 11/08/2016   HCT 40.5 11/08/2016   PLT 244.0 11/08/2016   GLUCOSE 97 08/18/2016   CHOL 143 08/18/2016   TRIG 146.0 08/18/2016   HDL 30.50 (L) 08/18/2016   LDLCALC 83 08/18/2016   ALT 10 08/18/2016   AST 12 08/18/2016   NA 142 08/18/2016   K 3.9 08/18/2016   CL 107 08/18/2016   CREATININE 0.92 08/18/2016   BUN 18 08/18/2016   CO2 28 08/18/2016   TSH 3.06 08/18/2016   HGBA1C 5.0 08/18/2016   Lab Results  Component Value Date   VITAMINB12 306 11/08/2016     ASSESSMENT AND PLAN:  Discussed the following assessment and plan:  Vitamin B 12 deficiency Tingling  Uncertain if related  But   Taking injec and oral?  -Patient advised to return or notify health care team  if symptoms worsen ,persist or new concerns arise.  There are no Patient Instructions on file for this visit.   Standley Brooking. Keston Seever M.D.

## 2016-12-14 ENCOUNTER — Other Ambulatory Visit: Payer: BLUE CROSS/BLUE SHIELD

## 2016-12-14 ENCOUNTER — Ambulatory Visit (INDEPENDENT_AMBULATORY_CARE_PROVIDER_SITE_OTHER): Payer: BLUE CROSS/BLUE SHIELD

## 2016-12-14 ENCOUNTER — Ambulatory Visit: Payer: BLUE CROSS/BLUE SHIELD | Admitting: Internal Medicine

## 2016-12-14 DIAGNOSIS — E538 Deficiency of other specified B group vitamins: Secondary | ICD-10-CM | POA: Diagnosis not present

## 2016-12-14 MED ORDER — CYANOCOBALAMIN 1000 MCG/ML IJ SOLN
1000.0000 ug | Freq: Once | INTRAMUSCULAR | Status: AC
  Administered 2016-12-14: 1000 ug via INTRAMUSCULAR

## 2016-12-14 NOTE — Progress Notes (Signed)
Patient was given her B 12 injection for her Vit B deficiency on 12/14/2016.Given by Rosealee Albee CMA.

## 2016-12-28 ENCOUNTER — Ambulatory Visit (INDEPENDENT_AMBULATORY_CARE_PROVIDER_SITE_OTHER): Payer: BLUE CROSS/BLUE SHIELD

## 2016-12-28 DIAGNOSIS — E538 Deficiency of other specified B group vitamins: Secondary | ICD-10-CM

## 2016-12-28 MED ORDER — CYANOCOBALAMIN 1000 MCG/ML IJ SOLN
1000.0000 ug | Freq: Once | INTRAMUSCULAR | Status: AC
  Administered 2016-12-28: 1000 ug via INTRAMUSCULAR

## 2016-12-28 NOTE — Progress Notes (Signed)
Patient got her B12 injection on 12/28/2016 at 11.30am for Vit B12 deficiency. Given by Rosealee Albee CMA.

## 2017-01-11 ENCOUNTER — Ambulatory Visit (INDEPENDENT_AMBULATORY_CARE_PROVIDER_SITE_OTHER): Payer: BLUE CROSS/BLUE SHIELD | Admitting: *Deleted

## 2017-01-11 DIAGNOSIS — E538 Deficiency of other specified B group vitamins: Secondary | ICD-10-CM

## 2017-01-11 MED ORDER — CYANOCOBALAMIN 1000 MCG/ML IJ SOLN
1000.0000 ug | Freq: Once | INTRAMUSCULAR | Status: AC
  Administered 2017-01-11: 1000 ug via INTRAMUSCULAR

## 2017-01-19 ENCOUNTER — Other Ambulatory Visit (INDEPENDENT_AMBULATORY_CARE_PROVIDER_SITE_OTHER): Payer: BLUE CROSS/BLUE SHIELD

## 2017-01-19 DIAGNOSIS — E538 Deficiency of other specified B group vitamins: Secondary | ICD-10-CM | POA: Diagnosis not present

## 2017-01-19 LAB — VITAMIN B12: VITAMIN B 12: 827 pg/mL (ref 211–911)

## 2017-01-21 ENCOUNTER — Encounter: Payer: Self-pay | Admitting: Internal Medicine

## 2017-01-21 ENCOUNTER — Ambulatory Visit (INDEPENDENT_AMBULATORY_CARE_PROVIDER_SITE_OTHER): Payer: BLUE CROSS/BLUE SHIELD | Admitting: Internal Medicine

## 2017-01-21 ENCOUNTER — Telehealth: Payer: Self-pay

## 2017-01-21 VITALS — BP 130/78 | HR 72 | Temp 97.5°F

## 2017-01-21 DIAGNOSIS — E538 Deficiency of other specified B group vitamins: Secondary | ICD-10-CM | POA: Diagnosis not present

## 2017-01-21 DIAGNOSIS — M5431 Sciatica, right side: Secondary | ICD-10-CM | POA: Diagnosis not present

## 2017-01-21 DIAGNOSIS — M7701 Medial epicondylitis, right elbow: Secondary | ICD-10-CM

## 2017-01-21 MED ORDER — DICLOFENAC SODIUM 1 % TD GEL
2.0000 g | Freq: Four times a day (QID) | TRANSDERMAL | 1 refills | Status: DC
Start: 2017-01-21 — End: 2017-05-11

## 2017-01-21 NOTE — Telephone Encounter (Signed)
Received PA request for Diclofenac gel. PA submitted & is approved. Form faxed back to pharmacy.

## 2017-01-21 NOTE — Progress Notes (Signed)
Chief Complaint  Patient presents with  . Follow-up    HPI: Natasha [name] 66 y.o. comes in today for follow-up of her B12 deficiency. She's been getting injections every 2 weeks and she can tell a difference in her energy level and moodiness numbness is different. No weakness. Her lab test was done 2 days after the injection so she felt probably not optimal. New problem 3 weeks right medial elbow pain not in the joint not on the surface tenderness when she touches it or what the counter but not when she's sitting at rest. No injury is doing water aerobics and life underwater upper arm exercise Sciatic is bothering her some sleep when she walks foot cramps in her right foot when she's sitting watching TV and has to get up and walk around. Thinks the water exercise is better for  her back ROS: See pertinent positives and negatives per HPI. No   Joint swelling    Past Medical History:  Diagnosis Date  . Allergic rhinitis   . Normal cardiac stress test     Family History  Problem Relation Age of Onset  . Lung disease Father   . Cancer Father     Colon  . Colon polyps Father   . Multiple sclerosis Mother   . Scleroderma Sister   . Parkinson's disease Maternal Grandmother   . Colon cancer Neg Hx     Social History   Social History  . Marital status: Married    Spouse name: N/A  . Number of children: N/A  . Years of education: N/A   Social History Main Topics  . Smoking status: Never Smoker  . Smokeless tobacco: Never Used  . Alcohol use 0.5 oz/week    1 drink(s) per week     Comment: or 1 every 2 weeks  . Drug use: No  . Sexual activity: Not Asked   Other Topics Concern  . 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    No tob    No outpatient prescriptions prior to visit.   Facility-Administered Medications Prior to Visit  Medication Dose  Route Frequency Provider Last Rate Last Dose  . cyanocobalamin ((VITAMIN B-12)) injection 1,000 mcg  1,000 mcg Intramuscular Weekly Burnis Medin, MD   1,000 mcg at 09/06/16 1303     EXAM:  BP 130/78 (BP Location: Right Arm, Patient Position: Sitting, Cuff Size: Large)   Pulse 72   Temp 97.5 F (36.4 C) (Oral)   SpO2 97%   There is no height or weight on file to calculate BMI.  GENERAL: vitals reviewed and listed above, alert, oriented, appears well hydrated and in no acute distress HEENT: atraumatic, conjunctiva  clear, no obvious abnormalities on inspection of external nose and ears MS: moves all extremities without noticeable focal  Abnormality  Right elbow tenderness medial epicondyle no crepitus  Nl rom   PSYCH: pleasant and cooperative, no obvious depression or anxiety See lab b12 875  Wt Readings from Last 3 Encounters:  07/29/16 230 lb 6.4 oz (104.5 kg)  09/25/14 238 lb (108 kg)  09/11/14 238 lb 12.8 oz (108.3 kg)    ASSESSMENT AND PLAN:  Discussed the following assessment and plan:  Vitamin B 12 deficiency - reports impr energy and less moodiness   Epicondylitis elbow, medial, right -  new problem disc exrcise techniw conservative theapy and fu if progressive etc   Right sided sciatica - wax and wanes   buttocks pain and foot carmps at times   -Patient advised to return or notify health care team  if symptoms worsen ,persist or new concerns arise.  Patient Instructions    Right  Elbow is proabably  Medial  epicondyitis or the equivalent golfers elbow. Change or technique in your water aerobics upper extremity exercise use cold or ice at the end of the day or after exercise Can try topical anti-inflammatory maximum 4 times a day. Continue exercise water is probably better for your sciatica then land exercise. Change B12 injections to every 3 weeks After 4 times check B12 level the week before shot is due. Depending on how it is doing we'll plan follow-up visit.  Or routine   Golfer's Elbow Rehab Ask your health care provider which exercises are safe for you. Do exercises exactly as told by your health care provider and adjust them as directed. It is normal to feel mild stretching, pulling, tightness, or discomfort as you do these exercises, but you should stop right away if you feel sudden pain or your pain gets worse. Do not begin these exercises until told by your health care provider. Stretching and range of motion exercises These exercises warm up your muscles and joints and improve the movement and flexibility of your elbow. Exercise A: Wrist flexors 1. Straighten your left / right elbow in front of you with your palm up. If told by your health care provider, do this stretch with your elbow bent rather than straight. 2. With your other hand, gently pull your left / right hand and fingers toward you until you feel a gentle stretch on the top of your forearm. 3. Hold this position for __________ seconds. Repeat __________ times. Complete this exercise __________ times a day. Strengthening exercises These exercises build strength and endurance in your elbow. Endurance is the ability to use your muscles for a long time, even after several repetitions. Exercise B: Wrist flexion 1. Sit with your left / right forearm palm-up and supported on a table or other surface. 2. Let your left / right wrist extend over the edge of the surface. 3. Hold a __________ weight or a piece of rubber exercise band or tubing. Slowly curl your hand up toward your forearm. Try to only move your hand and keep the rest of your arm still. 4. Hold this position for __________ seconds. 5. Slowly return to the starting position. Repeat __________ times. Complete this exercise __________ times a day. Exercise C: Eccentric wrist flexion 1. Sit with your left / right forearm palm-up and supported on a table or other surface. 2. Let your left / right wrist extend over the edge of the  surface. 3. Hold a __________ weight or a piece of rubber exercise band or tubing. 4. Use your other hand to move your left / right hand up toward your forearm. 5. Slowly return to the starting position using only your left / right hand. Repeat __________ times. Complete this exercise __________ times a day. Exercise D: Hand turns, pronation i 1. Sit with your left / right forearm supported on a table or other surface. 2. Move your wrist so your pinkie travels toward your forearm and your thumb moves away from your forearm. 3. Hold this position for __________ seconds. 4. Slowly return to the starting position. Exercise E: Hand turns, pronation ii 1. Sit  with your left / right forearm supported on a table or other surface. 2. Hold a hammer in your left / right hand.  The exercise will be easier if you hold on near the head of the hammer.  If you hold on toward the end of the handle, the exercise will be harder. 3. Rest your left / right hand over the edge of the table with your palm facing up. 4. Without moving your elbow, slowly turn your palm and your hand down toward the table. 5. Hold this position for __________ seconds. 6. Slowly return to the starting position. Repeat __________ times. Complete this exercise __________ times a day. Exercise F: Shoulder blade squeeze 1. Sit in a stable chair with good posture. Do not let your back touch the back of the chair. 2. Your arms should be at your sides with your elbows bent. You can rest your forearms on a pillow. 3. Squeeze your shoulder blades together. Keep your shoulders level. Do not lift your shoulders up toward your ears. 4. Hold this position for __________ seconds. 5. Slowly release. Repeat __________ times. Complete this exercise __________ times a day. This information is not intended to replace advice given to you by your health care provider. Make sure you discuss any questions you have with your health care  provider. Document Released: 11/22/2005 Document Revised: 07/29/2016 Document Reviewed: 08/04/2015 Elsevier Interactive Patient Education  2017 [employer]. Esa Raden M.D.

## 2017-01-21 NOTE — Patient Instructions (Addendum)
Right  Elbow is proabably  Medial  epicondyitis or the equivalent golfers elbow. Change or technique in your water aerobics upper extremity exercise use cold or ice at the end of the day or after exercise Can try topical anti-inflammatory maximum 4 times a day. Continue exercise water is probably better for your sciatica then land exercise. Change B12 injections to every 3 weeks After 4 times check B12 level the week before shot is due. Depending on how it is doing we'll plan follow-up visit. Or routine   Golfer's Elbow Rehab Ask your health care provider which exercises are safe for you. Do exercises exactly as told by your health care provider and adjust them as directed. It is normal to feel mild stretching, pulling, tightness, or discomfort as you do these exercises, but you should stop right away if you feel sudden pain or your pain gets worse. Do not begin these exercises until told by your health care provider. Stretching and range of motion exercises These exercises warm up your muscles and joints and improve the movement and flexibility of your elbow. Exercise A: Wrist flexors 1. Straighten your left / right elbow in front of you with your palm up. If told by your health care provider, do this stretch with your elbow bent rather than straight. 2. With your other hand, gently pull your left / right hand and fingers toward you until you feel a gentle stretch on the top of your forearm. 3. Hold this position for __________ seconds. Repeat __________ times. Complete this exercise __________ times a day. Strengthening exercises These exercises build strength and endurance in your elbow. Endurance is the ability to use your muscles for a long time, even after several repetitions. Exercise B: Wrist flexion 1. Sit with your left / right forearm palm-up and supported on a table or other surface. 2. Let your left / right wrist extend over the edge of the surface. 3. Hold a __________ weight  or a piece of rubber exercise band or tubing. Slowly curl your hand up toward your forearm. Try to only move your hand and keep the rest of your arm still. 4. Hold this position for __________ seconds. 5. Slowly return to the starting position. Repeat __________ times. Complete this exercise __________ times a day. Exercise C: Eccentric wrist flexion 1. Sit with your left / right forearm palm-up and supported on a table or other surface. 2. Let your left / right wrist extend over the edge of the surface. 3. Hold a __________ weight or a piece of rubber exercise band or tubing. 4. Use your other hand to move your left / right hand up toward your forearm. 5. Slowly return to the starting position using only your left / right hand. Repeat __________ times. Complete this exercise __________ times a day. Exercise D: Hand turns, pronation i 1. Sit with your left / right forearm supported on a table or other surface. 2. Move your wrist so your pinkie travels toward your forearm and your thumb moves away from your forearm. 3. Hold this position for __________ seconds. 4. Slowly return to the starting position. Exercise E: Hand turns, pronation ii 1. Sit with your left / right forearm supported on a table or other surface. 2. Hold a hammer in your left / right hand.  The exercise will be easier if you hold on near the head of the hammer.  If you hold on toward the end of the handle, the exercise will be harder. 3.  Rest your left / right hand over the edge of the table with your palm facing up. 4. Without moving your elbow, slowly turn your palm and your hand down toward the table. 5. Hold this position for __________ seconds. 6. Slowly return to the starting position. Repeat __________ times. Complete this exercise __________ times a day. Exercise F: Shoulder blade squeeze 1. Sit in a stable chair with good posture. Do not let your back touch the back of the chair. 2. Your arms should be at your  sides with your elbows bent. You can rest your forearms on a pillow. 3. Squeeze your shoulder blades together. Keep your shoulders level. Do not lift your shoulders up toward your ears. 4. Hold this position for __________ seconds. 5. Slowly release. Repeat __________ times. Complete this exercise __________ times a day. This information is not intended to replace advice given to you by your health care provider. Make sure you discuss any questions you have with your health care provider. Document Released: 11/22/2005 Document Revised: 07/29/2016 Document Reviewed: 08/04/2015 Elsevier Interactive Patient Education  2017 [employer].

## 2017-01-21 NOTE — Progress Notes (Signed)
Pre visit review using our clinic review tool, if applicable. No additional management support is needed unless otherwise documented below in the visit note. 

## 2017-02-24 ENCOUNTER — Telehealth: Payer: Self-pay | Admitting: Internal Medicine

## 2017-02-24 NOTE — Telephone Encounter (Signed)
Please advise 

## 2017-02-24 NOTE — Telephone Encounter (Signed)
Pt will be going to [place] in [place] on 03-05-17 for 1 wk and is having sciatica pain. Pt would like pain med calling to [facility] [address] for 1 wk supply only. Pt has been taking aleve with little relief

## 2017-02-24 NOTE — Telephone Encounter (Signed)
Can try prednisone 10 day taper  te decrease inflammation   dont usus ally use pain med for back but can use tramadol 50 mg 1 if needed up to every 8 hours for  Pain .  Disp 24  No refills

## 2017-02-25 MED ORDER — PREDNISONE 10 MG PO TABS: ORAL_TABLET | ORAL | 0 refills | Status: DC

## 2017-02-25 MED ORDER — TRAMADOL HCL 50 MG PO TABS: 50.0000 mg | ORAL_TABLET | Freq: Three times a day (TID) | ORAL | 0 refills | Status: DC | PRN

## 2017-02-25 NOTE — Telephone Encounter (Signed)
Spoke with and informed her that prescription would be sent in to her pharmacy

## 2017-03-09 NOTE — Progress Notes (Signed)
This encounter was incorrectly sent to my desk top with nothing on it   I prseumeI since she got a B12 injection but I'm not sure .I'll be closing encounter with no charge level of service

## 2017-05-02 DIAGNOSIS — K219 Gastro-esophageal reflux disease without esophagitis: Secondary | ICD-10-CM | POA: Diagnosis not present

## 2017-05-02 DIAGNOSIS — Z79899 Other long term (current) drug therapy: Secondary | ICD-10-CM | POA: Insufficient documentation

## 2017-05-02 DIAGNOSIS — R1013 Epigastric pain: Secondary | ICD-10-CM | POA: Diagnosis present

## 2017-05-03 ENCOUNTER — Encounter (HOSPITAL_COMMUNITY): Payer: Self-pay | Admitting: Family Medicine

## 2017-05-03 ENCOUNTER — Emergency Department (HOSPITAL_COMMUNITY): Payer: BLUE CROSS/BLUE SHIELD

## 2017-05-03 ENCOUNTER — Emergency Department (HOSPITAL_COMMUNITY)
Admission: EM | Admit: 2017-05-03 | Discharge: 2017-05-03 | Disposition: A | Discharge status: Home / Self Care | Payer: BLUE CROSS/BLUE SHIELD | Attending: Emergency Medicine | Admitting: Emergency Medicine

## 2017-05-03 DIAGNOSIS — K219 Gastro-esophageal reflux disease without esophagitis: Secondary | ICD-10-CM

## 2017-05-03 LAB — URINALYSIS, ROUTINE W REFLEX MICROSCOPIC
BACTERIA UA: NONE SEEN
Bilirubin Urine: NEGATIVE
Glucose, UA: NEGATIVE mg/dL
KETONES UR: NEGATIVE mg/dL
Leukocytes, UA: NEGATIVE
Nitrite: NEGATIVE
PROTEIN: NEGATIVE mg/dL
Specific Gravity, Urine: 1.029 (ref 1.005–1.030)
pH: 5 (ref 5.0–8.0)

## 2017-05-03 LAB — LIPASE, BLOOD: Lipase: 28 U/L (ref 11–51)

## 2017-05-03 LAB — CBC
HEMATOCRIT: 43.3 % (ref 36.0–46.0)
HEMOGLOBIN: 14.6 g/dL (ref 12.0–15.0)
MCH: 31.9 pg (ref 26.0–34.0)
MCHC: 33.7 g/dL (ref 30.0–36.0)
MCV: 94.7 fL (ref 78.0–100.0)
Platelets: 267 10*3/uL (ref 150–400)
RBC: 4.57 MIL/uL (ref 3.87–5.11)
RDW: 12.9 % (ref 11.5–15.5)
WBC: 8.2 10*3/uL (ref 4.0–10.5)

## 2017-05-03 LAB — COMPREHENSIVE METABOLIC PANEL
ALBUMIN: 4.1 g/dL (ref 3.5–5.0)
ALK PHOS: 92 U/L (ref 38–126)
ALT: 14 U/L (ref 14–54)
ANION GAP: 9 (ref 5–15)
AST: 16 U/L (ref 15–41)
BILIRUBIN TOTAL: 0.3 mg/dL (ref 0.3–1.2)
BUN: 23 mg/dL — AB (ref 6–20)
CALCIUM: 10.9 mg/dL — AB (ref 8.9–10.3)
CO2: 26 mmol/L (ref 22–32)
Chloride: 105 mmol/L (ref 101–111)
Creatinine, Ser: 0.92 mg/dL (ref 0.44–1.00)
GFR calc Af Amer: >60 mL/min (ref >60)
GFR calc non Af Amer: >60 mL/min (ref >60)
GLUCOSE: 108 mg/dL — AB (ref 65–99)
Potassium: 4.2 mmol/L (ref 3.5–5.1)
SODIUM: 140 mmol/L (ref 135–145)
TOTAL PROTEIN: 7.2 g/dL (ref 6.5–8.1)

## 2017-05-03 LAB — POCT I-STAT TROPONIN I
TROPONIN I, POC: 0.04 ng/mL (ref 0.00–0.08)
TROPONIN I, POC: 0 ng/mL (ref 0.00–0.08)

## 2017-05-03 MED ORDER — GI COCKTAIL ~~LOC~~
30.0000 mL | Freq: Once | ORAL | Status: AC
  Administered 2017-05-03: 30 mL via ORAL
  Filled 2017-05-03: qty 30

## 2017-05-03 MED ORDER — OMEPRAZOLE 40 MG PO CPDR: 40.0000 mg | DELAYED_RELEASE_CAPSULE | Freq: Every day | ORAL | 0 refills | Status: DC

## 2017-05-03 NOTE — ED Provider Notes (Signed)
[facility] DEPT Provider Note   CSN: [id number] Arrival date & time: 05/02/17  2352  By signing my name below, I, Natasha [name], attest that this documentation has been prepared under the direction and in the presence of [place], Branko Steeves, MD. Electronically Signed: Hansel [name], ED Scribe. 05/03/17. 3:52 AM.    History   Chief Complaint Chief Complaint  Patient presents with  . Abdominal Pain    HPI Natasha [name] is a 66 y.o. female who presents to the Emergency Department complaining of moderate, improving mid epigastric abdominal pain that began at 9 pm last night. She denies pain radiation. Pt reports her symptoms were similar to heartburn, so she took 10 Tums and an alka seltzer with no relief. She states her current pain is slightly different from her normal heartburn as it "feels like a fist". She reports she had [place] for dinner. Last normal bowel movement was yesterday morning. She denies belching, vomiting, diarrhea, flatus, cough, acidic taste in throat, shoulder pain, leg swelling. Pt does report joint pain for the last year after weight gain, then subsequently weight loss. She reports this is mostly with walking up the stairs, and she does not have this problem when swimming.    The history is provided by the patient. No language interpreter was used.  Abdominal Pain   This is a new problem. The current episode started 3 to 5 hours ago. The problem occurs constantly. The problem has been gradually improving. The pain is associated with eating. The pain is located in the epigastric region. Quality: fist. The pain is moderate. Pertinent negatives include fever, flatus, hematochezia, melena, nausea, vomiting, constipation, dysuria, frequency and myalgias. Nothing aggravates the symptoms. Nothing relieves the symptoms. Past workup does not include surgery. Her past medical history does not include irritable bowel syndrome.    Past Medical History:  Diagnosis Date  . Allergic  rhinitis   . Normal cardiac stress test     Patient Active Problem List   Diagnosis Date Noted  . Vitamin B 12 deficiency 09/06/2016  . Routine general medical examination at a health care facility 10/02/2012  . Routine gynecological examination 10/02/2012  . Family history of colon cancer 10/02/2012  . HYPERLIPIDEMIA 08/06/2008  . OBESITY 08/06/2008  . ALLERGIC RHINITIS 08/06/2008  . LOW HDL 04/11/2008  . MITRAL VALVE PROLAPSE 04/11/2008    Past Surgical History:  Procedure Laterality Date  . MYRINGOTOMY     right 7 th grade    OB History    No data available       Home Medications    Prior to Admission medications   Medication Sig Start Date End Date Taking? Authorizing Provider  calcium carbonate (TUMS - DOSED IN MG ELEMENTAL CALCIUM) 500 MG chewable tablet Chew 1 tablet by mouth daily as needed for indigestion or heartburn.   Yes [provider]  diclofenac sodium (VOLTAREN) 1 % GEL Apply 2-4 g topically 4 (four) times daily. To affected area Patient not taking: Reported on 05/03/2017 01/21/17   Panosh, Standley Brooking, MD  predniSONE (DELTASONE) 10 MG tablet Take pills per day,6,6,6,4,4,4,2,2,2,1,1,1 Patient not taking: Reported on 05/03/2017 02/25/17   Panosh, Standley Brooking, MD  traMADol (ULTRAM) 50 MG tablet Take 1 tablet (50 mg total) by mouth every 8 (eight) hours as needed for moderate pain or severe pain. Patient not taking: Reported on 05/03/2017 02/25/17   Panosh, Standley Brooking, MD    Family History Family History  Problem Relation Age of Onset  . Lung  disease Father   . Cancer Father        Colon  . Colon polyps Father   . Multiple sclerosis Mother   . Scleroderma Sister   . Parkinson's disease Maternal Grandmother   . Colon cancer Neg Hx     Social History Social History  Substance Use Topics  . Smoking status: Never Smoker  . Smokeless tobacco: Never Used  . Alcohol use 0.5 oz/week    1 Standard drinks or equivalent per week     Comment: 1 every 2 weeks        Allergies   Patient has no known allergies.   Review of Systems Review of Systems  Constitutional: Negative for appetite change, chills, diaphoresis and fever.  HENT: Negative for drooling and facial swelling.   Eyes: Negative for photophobia.  Respiratory: Negative for cough, shortness of breath, wheezing and stridor.   Cardiovascular: Negative for chest pain, palpitations and leg swelling.  Gastrointestinal: Positive for abdominal pain. Negative for anal bleeding, constipation, flatus, hematochezia, melena, nausea and vomiting.  Genitourinary: Negative for difficulty urinating, dysuria and frequency.  Musculoskeletal: Negative for myalgias and neck stiffness.  Skin: Negative for pallor.  Neurological: Negative for facial asymmetry and speech difficulty.  Psychiatric/Behavioral: Negative for suicidal ideas.  All other systems reviewed and are negative.    Physical Exam Updated Vital Signs BP (!) 156/90 (BP Location: Left Arm)   Pulse 84   Temp 97.9 F (36.6 C) (Oral)   Resp 18   Ht 5\' 6"  (1.676 m)   Wt 250 lb (113.4 kg)   SpO2 94%   BMI 40.35 kg/m   Physical Exam  Constitutional: She is oriented to person, place, and time. She appears well-developed and well-nourished.  HENT:  Head: Normocephalic and atraumatic.  Mouth/Throat: Oropharynx is clear and moist. No oropharyngeal exudate.  Moist mucous membranes. No exudates.   Eyes: Conjunctivae and EOM are normal. Pupils are equal, round, and reactive to light.  Neck: Normal range of motion. Neck supple. No JVD present. No tracheal deviation present.  No carotid bruits. Trachea midline.   Cardiovascular: Normal rate, regular rhythm, normal heart sounds and intact distal pulses.  Exam reveals no gallop and no friction rub.   No murmur heard. RRR.   Pulmonary/Chest: Effort normal and breath sounds normal. No stridor. No respiratory distress. She has no wheezes. She has no rales.  Lungs CTA bilaterally.   Abdominal:  Soft. She exhibits no distension. There is no tenderness. There is no rebound and no guarding.  Hyperactive bowel sounds only in the epigastrium. Normal bowel sounds elsewhere   Musculoskeletal: Normal range of motion. She exhibits no edema, tenderness or deformity.  Lymphadenopathy:    She has no cervical adenopathy.  Neurological: She is alert and oriented to person, place, and time. She has normal reflexes. She displays normal reflexes.  Skin: Skin is warm and dry. Capillary refill takes less than 2 seconds.  Psychiatric: She has a normal mood and affect.  Nursing note and vitals reviewed.    ED Treatments / Results   DIAGNOSTIC STUDIES: Oxygen Saturation is 94% on RA, adequate by my interpretation.    COORDINATION OF CARE: 3:46 AM Discussed treatment plan with pt at bedside which includes labs and pt agreed to plan.    Labs (all labs ordered are listed, but only abnormal results are displayed) Labs Reviewed  COMPREHENSIVE METABOLIC PANEL - Abnormal; Notable for the following:       Result Value   Glucose,  Bld 108 (*)    BUN 23 (*)    Calcium 10.9 (*)    All other components within normal limits  LIPASE, BLOOD  CBC  URINALYSIS, ROUTINE W REFLEX MICROSCOPIC  I-STAT TROPOININ, ED  POCT I-STAT TROPONIN I    EKG  EKG Interpretation  Date/Time:  Tuesday May 03 2017 00:28:34 EDT Ventricular Rate:  81 PR Interval:    QRS Duration: 86 QT Interval:  386 QTC Calculation: 448 R Axis:   62 Text Interpretation:  Sinus rhythm Borderline T abnormalities Confirmed by Dory Horn) on 05/03/2017 12:42:46 AM       Radiology Dg Chest 2 View  Result Date: 05/03/2017 CLINICAL DATA:  Acute onset of epigastric abdominal pain. Initial encounter. EXAM: CHEST  2 VIEW COMPARISON:  None. FINDINGS: The lungs are well-aerated and clear. There is no evidence of focal opacification, pleural effusion or pneumothorax. The heart is normal in size; the mediastinal contour is within  normal limits. No acute osseous abnormalities are seen. IMPRESSION: No acute cardiopulmonary process seen. Electronically Signed   By: Garald Balding M.D.   On: 05/03/2017 01:26    Procedures Procedures (including critical care time)  Medications Ordered in ED Medications  gi cocktail (Maalox,Lidocaine,Donnatal) (30 mLs Oral Given 05/03/17 0414)    Markedly better post medication.    Final Clinical Impressions(s) / ED Diagnoses  GERD:  Ruled out for MI in the Ed with negative stress test. HEART score low risk for MACE.  Symptoms not consistent with PE as are in the abdomen without SOB nor leg pain.  No signs of a surgical abdomen.   Return immediately for fever >101, rigid abdomen, inability to pass gas, chest pain with exertional, vomiting, back pain, DOE, or any concerns . Follow up with your own doctor for recheck GI if symptoms persist.    The patient is nontoxic-appearing on exam and vital signs are within normal limits.   I have reviewed the triage vital signs and the nursing notes. Pertinent labs &imaging results that were available during my care of the patient were reviewed by me and considered in my medical decision making (see chart for details).  After history, exam, and medical workup I feel the patient has been appropriately medically screened and is safe for discharge home. Pertinent diagnoses were discussed with the patient. Patient was given return precautions.  I personally performed the services described in this documentation, which was scribed in my presence. The recorded information has been reviewed and is accurate.          Valentina Alcoser, MD 05/03/17 4010

## 2017-05-03 NOTE — ED Notes (Signed)
Patient is alert and oriented x3.  She was given DC instructions and follow up visit instructions.  Patient gave verbal understanding. She was DC ambulatory under her own power to home.  V/S stable.  He was not showing any signs of distress on DC 

## 2017-05-03 NOTE — ED Triage Notes (Signed)
Patient is experiencing epigastric pain that started around 21:00 last night. Pt reports she has took OTC antiacids with no relief. Denies any nausea, vomiting, diarrhea, or fever.

## 2017-05-06 ENCOUNTER — Encounter: Payer: Self-pay | Admitting: Physician Assistant

## 2017-05-11 ENCOUNTER — Encounter: Payer: Self-pay | Admitting: Physician Assistant

## 2017-05-11 ENCOUNTER — Ambulatory Visit (INDEPENDENT_AMBULATORY_CARE_PROVIDER_SITE_OTHER): Payer: BLUE CROSS/BLUE SHIELD | Admitting: Physician Assistant

## 2017-05-11 VITALS — BP 122/80 | HR 74 | Ht 66.0 in

## 2017-05-11 DIAGNOSIS — M5440 Lumbago with sciatica, unspecified side: Secondary | ICD-10-CM

## 2017-05-11 DIAGNOSIS — K219 Gastro-esophageal reflux disease without esophagitis: Secondary | ICD-10-CM

## 2017-05-11 DIAGNOSIS — R1013 Epigastric pain: Secondary | ICD-10-CM

## 2017-05-11 MED ORDER — OMEPRAZOLE 40 MG PO CPDR: 40.0000 mg | DELAYED_RELEASE_CAPSULE | Freq: Every day | ORAL | 1 refills | Status: DC

## 2017-05-11 NOTE — Progress Notes (Signed)
Chief Complaint: GERD, epigastric pain  HPI:  Natasha [name] is a 66 year old Caucasian female with a past medical history as listed below, who recently presented to the ED on 05/03/17 with a complaint of epigastric abdominal pain. She is here today for follow-up.   Per chart review patient was seen in the ED 05/03/17  and at that time described a mid epigastric abdominal pain that began around 9 PM the night before, she describes her symptoms were similar to heartburn and she took 10 Tums and Alka-Seltzer with no relief. She describes the pain as "like a fist". Labs including CMP, lipase, CBC, troponins and urinalysis were normal/negative. Patient had an EKG and chest x-ray which were normal. Patient was given a GI cocktail and started on Omeprazole 40 mg daily.    Patient was previously seen by Dr. Olevia Perches with her last colonoscopy 09/25/14. Repeat was recommended in 5 years.   Today, the patient tells me that she has had reflux for the past 10 years intermittently and typically takes Tums when necessary, over the past 3 months she has had to take an increased number of Tums "popping them like M&Ms", and on 05/03/17 had an acute episode of epigastric pain. Patient tells me that this pain felt like she got "punched in the stomach", this was different than her normal heartburn symptoms and was rated as an 8-9/10. Patient tells me her family are hypochondriacs and made her go to the ER. She describes that after being there, her symptoms were somewhat better and after starting Omeprazole 40 mg daily she had 2 days of "lurking symptoms", but then after that could no longer feel any pain or heartburn.    Patient tells me that she does not like to be on medications and would like to not stay on this medicine if not needed. Although, she is traveling out of the country for 2 months starting in July and does not want to have an episode of epigastric pain or have reflux. Associated symptoms include 2 episodes of  dysphagia over the past 10 years to chicken. This has not occurred recently.   She also takes time today to described low back pain with sciatica down to her right leg. Patient tells me she wakes every morning with back pain, which will go away within an hour if she sits up and gets going.   Patient denies a fever, chills, [name] in her stool, melena, weight loss, fatigue, anorexia, nausea, vomiting, use of NSAIDs, excess stress, change in diet or symptoms that awaken her at night.  Past Medical History:  Diagnosis Date  . Allergic rhinitis   . Normal cardiac stress test     Past Surgical History:  Procedure Laterality Date  . MYRINGOTOMY     right 7 th grade    Current Outpatient Prescriptions  Medication Sig Dispense Refill  . omeprazole (PRILOSEC) 40 MG capsule Take 1 capsule (40 mg total) by mouth daily. 30 capsule 0   Current Facility-Administered Medications  Medication Dose Route Frequency Provider Last Rate Last Dose  . cyanocobalamin ((VITAMIN B-12)) injection 1,000 mcg  1,000 mcg Intramuscular Weekly Panosh, Standley Brooking, MD   1,000 mcg at 09/06/16 1303    Allergies as of 05/11/2017  . (No Known Allergies)    Family History  Problem Relation Age of Onset  . Lung disease Father   . Cancer Father        Colon  . Colon polyps Father   . Multiple sclerosis Mother   .  Scleroderma Sister   . Parkinson's disease Maternal Grandmother   . Colon cancer Neg Hx     Social History   Social History  . Marital status: Married    Spouse name: N/A  . Number of children: N/A  . Years of education: N/A   Occupational History  . Not on file.   Social History Main Topics  . Smoking status: Never Smoker  . Smokeless tobacco: Never Used  . Alcohol use 0.5 oz/week    1 Standard drinks or equivalent per week     Comment: 1 every 2 weeks   . Drug use: No  . Sexual activity: Not on file   Other Topics Concern  . 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    No tob    Review of Systems:    Constitutional: No fever or chills Skin: No rash  Cardiovascular: No chest pain Respiratory: No SOB  Gastrointestinal: See HPI and otherwise negative Genitourinary: No dysuria Neurological: No headache Musculoskeletal: No new muscle or joint pain Hematologic: No bleeding  Psychiatric: No history of depression or anxiety   Physical Exam:  Vital signs: BP 122/80   Pulse 74   Ht 5\' 6"  (1.676 m)    Constitutional:   Pleasant Obese Caucasian female appears to be in NAD, Well developed, Well nourished, alert and cooperative Head:  Normocephalic and atraumatic. Eyes:   PEERL, EOMI. No icterus. Conjunctiva pink. Ears:  Normal auditory acuity. Neck:  Supple Throat: Oral cavity and pharynx without inflammation, swelling or lesion.  Respiratory: Respirations even and unlabored. Lungs clear to auscultation bilaterally.   No wheezes, crackles, or rhonchi.  Cardiovascular: Normal S1, S2. No MRG. Regular rate and rhythm. No peripheral edema, cyanosis or pallor.  Gastrointestinal:  Soft, nondistended, nontender. No rebound or guarding. Normal bowel sounds. No appreciable masses or hepatomegaly. Rectal:  Not performed.  Msk:  Symmetrical without gross deformities. Without edema, no deformity or joint abnormality.  Neurologic:  Alert and  oriented x4;  grossly normal neurologically.  Skin:   Dry and intact without significant lesions or rashes. Psychiatric: Demonstrates good judgement and reason without abnormal affect or behaviors.  RELEVANT LABS AND IMAGING: CBC    Component Value Date/Time   WBC 8.2 05/03/2017 0118   RBC 4.57 05/03/2017 0118   HGB 14.6 05/03/2017 0118   HCT 43.3 05/03/2017 0118   PLT 267 05/03/2017 0118   MCV 94.7 05/03/2017 0118   MCH 31.9 05/03/2017 0118   MCHC 33.7 05/03/2017 0118   RDW 12.9 05/03/2017 0118    LYMPHSABS 1.0 11/08/2016 0952   MONOABS 0.4 11/08/2016 0952   EOSABS 0.5 11/08/2016 0952   BASOSABS 0.0 11/08/2016 0952    CMP     Component Value Date/Time   NA 140 05/03/2017 0118   K 4.2 05/03/2017 0118   CL 105 05/03/2017 0118   CO2 26 05/03/2017 0118   GLUCOSE 108 (H) 05/03/2017 0118   GLUCOSE 0 (LL) 12/14/2006 0927   BUN 23 (H) 05/03/2017 0118   CREATININE 0.92 05/03/2017 0118   CALCIUM 10.9 (H) 05/03/2017 0118   PROT 7.2 05/03/2017 0118   ALBUMIN 4.1 05/03/2017 0118   AST 16 05/03/2017 0118   ALT 14 05/03/2017 0118   ALKPHOS 92 05/03/2017 0118   BILITOT 0.3 05/03/2017 0118  GFRNONAA >60 05/03/2017 0118   GFRAA >60 05/03/2017 0118    Assessment: 1. GERD: Patient has had symptoms for over 10 years for which she has used Tums here and there but over the past 3 months has had an increase in symptoms and then an acute epigastric pain episode as below, all of these have been relieved on Omeprazole 40 daily 2. Epigastric abdominal pain: Patient describes one acute episode of epigastric pain that radiated into her chest, she went to the ER as discussed in history of present illness and had negative cardiac workup, patient has had relief of all symptoms after starting Omeprazole 40 mg once daily; likely this is related to gastritis 3. Low back pain: Consider relation to weight, with sciatica, patient requests referral  Plan: 1. Discussed with the patient today that since her symptoms are better with Omeprazole 40 mg daily, would recommend that she continue this. Typically I would have her continue this for 4-6 weeks, but the patient is going out of the country over July and August and doesn't want to have chest pain or reflux while she is gone. Provided her with a prescription for 90 days with one refill. 2. Reviewed antireflux diet and lifestyle modifications. 3. Patient would like to establish care with Dr. Loletha Carrow as her husband sees him 4. Patient requests a referral to sports  medicine, referred her to Dr. Tamala Julian for her low back pain with sciatica 5. Patient to follow in clinic in 3-4 months when she returns from out of town. Did discuss that she can try weaning off of her Omeprazole about 2 weeks before coming in, taking one every other day and then stopping the week before to see how she is doing. If she continues to have symptoms at that time, recommend an EGD  Natasha Newer, PA-C [facility] 05/11/2017, 2:23 PM  Cc: Burnis Medin, MD

## 2017-05-11 NOTE — Progress Notes (Signed)
Thank you for sending this case to me. I have reviewed the entire note, and the outlined plan seems appropriate.  If she has another episode of this pain, she needs a gallbladder ultrasound.  Wilfrid Lund, MD

## 2017-05-11 NOTE — Patient Instructions (Signed)
We have sent the following medications to your pharmacy for you to pick up at your convenience:  Omeprazole 40 mg daily  

## 2017-05-16 ENCOUNTER — Encounter: Payer: Self-pay | Admitting: Internal Medicine

## 2017-05-16 ENCOUNTER — Ambulatory Visit (INDEPENDENT_AMBULATORY_CARE_PROVIDER_SITE_OTHER): Payer: BLUE CROSS/BLUE SHIELD | Admitting: Internal Medicine

## 2017-05-16 VITALS — BP 130/80 | HR 70 | Temp 97.9°F

## 2017-05-16 DIAGNOSIS — Z8619 Personal history of other infectious and parasitic diseases: Secondary | ICD-10-CM | POA: Diagnosis not present

## 2017-05-16 DIAGNOSIS — L259 Unspecified contact dermatitis, unspecified cause: Secondary | ICD-10-CM | POA: Diagnosis not present

## 2017-05-16 DIAGNOSIS — L304 Erythema intertrigo: Secondary | ICD-10-CM

## 2017-05-16 MED ORDER — PERMETHRIN 5 % EX CREA: TOPICAL_CREAM | CUTANEOUS | 0 refills | Status: DC

## 2017-05-16 MED ORDER — FLUOCINONIDE-E 0.05 % EX CREA: 1.0000 "application " | TOPICAL_CREAM | Freq: Two times a day (BID) | CUTANEOUS | 1 refills | Status: DC

## 2017-05-16 NOTE — Progress Notes (Signed)
Chief Complaint  Patient presents with  . scabies    HPI: Natasha [name] 66 y.o.  sda Concern about rash thinks it could be scabies because she had scabies a few years ago. Onset 2-3 days after working in the garden with itchy bumps on the right forearm and then the left forearm that continued to increase. There was never any pain she is using over-the-counter topical for scabies and some leftover permethrin. She then had some itchy areas under both breasts that are.but not seriously itchy. No interdigital axillary waistband rash. No exposures to scabies no one has a rash in her household. This is a different since when she had scabies that presented in between her fingers. She is going away to [place] the end of the month and wants to be better. Asks about shingrix vaccine ROS: See pertinent positives and negatives per HPI.  Past Medical History:  Diagnosis Date  . Allergic rhinitis   . Normal cardiac stress test     Family History  Problem Relation Age of Onset  . Lung disease Father   . Cancer Father        Colon  . Colon polyps Father   . Multiple sclerosis Mother   . Scleroderma Sister   . Parkinson's disease Maternal Grandmother   . Colon cancer Neg Hx     Social History   Social History  . Marital status: Married    Spouse name: N/A  . Number of children: N/A  . Years of education: N/A   Social History Main Topics  . Smoking status: Never Smoker  . Smokeless tobacco: Never Used  . Alcohol use 0.5 oz/week    1 Standard drinks or equivalent per week     Comment: 1 every 2 weeks   . Drug use: No  . Sexual activity: Not Asked   Other Topics Concern  . 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    No tob    Outpatient Medications Prior to Visit  Medication Sig Dispense Refill  . omeprazole (PRILOSEC) 40 MG capsule Take 1 capsule (40  mg total) by mouth daily before breakfast. 90 capsule 1   Facility-Administered Medications Prior to Visit  Medication Dose Route Frequency Provider Last Rate Last Dose  . cyanocobalamin ((VITAMIN B-12)) injection 1,000 mcg  1,000 mcg Intramuscular Weekly Molley Houser, Standley Brooking, MD   1,000 mcg at 09/06/16 1303     EXAM:  BP 130/80 (BP Location: Right Arm, Patient Position: Sitting, Cuff Size: Large)   Pulse 70   Temp 97.9 F (36.6 C) (Oral)   There is no height or weight on file to calculate BMI.  GENERAL: vitals reviewed and listed above, alert, oriented, appears well hydrated and in no acute distress HEENT: atraumatic, conjunctiva  clear, no obvious abnormalities on inspection of external nose and ears   Skin bilateral forearm almost symmetrical  Contact derm type rash without burros   Hands and waist clear  Few dot papules  In bra area    No vesicles  plams nl  No other rash    MS: moves all extremities without noticeable focal  abnormality PSYCH: pleasant and cooperative, no obvious depression or anxiety  ASSESSMENT AND PLAN:  Discussed the following assessment and plan:  Contact dermatitis, unspecified contact dermatitis type, unspecified trigger - most  likely rhus   Intertrigo  History of scabies Reviewed with patient this doesn't really look like scabies more likely to be contact dermatitis from something in the gardening expectant management topical steroid high-dose minimalist treatment for the breast area. If progressive symptoms can treat with permethrin as she is leaving the country. Risk benefits discussed. Also discussed the shingles vaccine if covered she can call at any time to get appointment for injection. -Patient advised to return or notify health care team  if symptoms worsen ,persist or new concerns arise.  Patient Instructions  The arm rash acts like  Contact dermatitis.    The breast, rash could be from  Intertrigo   irritation  And  Sometimes yeast  ....  Can give you the  pyrmetrin  5%  If needed.   Use the strong cortisone cream on the arms You can use an over-the-counter 1% hydrocortisone in the breast area and keep as dry as possible   Ask your insurance about reimbursement coverage for the nutrition greatest vaccine and call for an appointment to get the injection at any time if you wish.   Contact Dermatitis Dermatitis is redness, soreness, and swelling (inflammation) of the skin. Contact dermatitis is a reaction to certain substances that touch the skin. There are two types of contact dermatitis:  Irritant contact dermatitis. This type is caused by something that irritates your skin, such as dry hands from washing them too much. This type does not require previous exposure to the substance for a reaction to occur. This type is more common.  Allergic contact dermatitis. This type is caused by a substance that you are allergic to, such as a nickel allergy or poison ivy. This type only occurs if you have been exposed to the substance (allergen) before. Upon a repeat exposure, your body reacts to the substance. This type is less common.  What are the causes? Many different substances can cause contact dermatitis. Irritant contact dermatitis is most commonly caused by exposure to:  Makeup.  Soaps.  Detergents.  Bleaches.  Acids.  Metal salts, such as nickel.  Allergic contact dermatitis is most commonly caused by exposure to:  Poisonous plants.  Chemicals.  Jewelry.  Latex.  Medicines.  Preservatives in products, such as clothing.  What increases the risk? This condition is more likely to develop in:  People who have jobs that expose them to irritants or allergens.  People who have certain medical conditions, such as asthma or eczema.  What are the signs or symptoms? Symptoms of this condition may occur anywhere on your body where the irritant has touched you or is touched by you. Symptoms  include:  Dryness or flaking.  Redness.  Cracks.  Itching.  Pain or a burning feeling.  Blisters.  Drainage of small amounts of blood or clear fluid from skin cracks.  With allergic contact dermatitis, there may also be swelling in areas such as the eyelids, mouth, or genitals. How is this diagnosed? This condition is diagnosed with a medical history and physical exam. A patch skin test may be performed to help determine the cause. If the condition is related to your job, you may need to see an occupational medicine specialist. How is this treated? Treatment for this condition includes figuring out what caused the reaction and protecting your skin from further contact. Treatment may also include:  Steroid creams or ointments. Oral steroid medicines may be needed in more severe cases.  Antibiotics or antibacterial ointments, if a skin infection  is present.  Antihistamine lotion or an antihistamine taken by mouth to ease itching.  A bandage (dressing).  Follow these instructions at home: [place] your skin as needed.  Apply cool compresses to the affected areas.  Try taking a bath with: ? Epsom salts. Follow the instructions on the packaging. You can get these at your local pharmacy or grocery store. ? Baking soda. Pour a small amount into the bath as directed by your health care provider. ? Colloidal oatmeal. Follow the instructions on the packaging. You can get this at your local pharmacy or grocery store.  Try applying baking soda paste to your skin. Stir water into baking soda until it reaches a paste-like consistency.  Do not scratch your skin.  Bathe less frequently, such as every other day.  Bathe in lukewarm water. Avoid using hot water. Medicines  Take or apply over-the-counter and prescription medicines only as told by your health care provider.  If you were prescribed an antibiotic medicine, take or apply your antibiotic as told by your  health care provider. Do not stop using the antibiotic even if your condition starts to improve. General instructions  Keep all follow-up visits as told by your health care provider. This is important.  Avoid the substance that caused your reaction. If you do not know what caused it, keep a journal to try to track what caused it. Write down: ? What you eat. ? What cosmetic products you use. ? What you drink. ? What you wear in the affected area. This includes jewelry.  If you were given a dressing, take care of it as told by your health care provider. This includes when to change and remove it. Contact a health care provider if:  Your condition does not improve with treatment.  Your condition gets worse.  You have signs of infection such as swelling, tenderness, redness, soreness, or warmth in the affected area.  You have a fever.  You have new symptoms. Get help right away if:  You have a severe headache, neck pain, or neck stiffness.  You vomit.  You feel very sleepy.  You notice red streaks coming from the affected area.  Your bone or joint underneath the affected area becomes painful after the skin has healed.  The affected area turns darker.  You have difficulty breathing. This information is not intended to replace advice given to you by your health care provider. Make sure you discuss any questions you have with your health care provider. Document Released: 11/19/2000 Document Revised: 04/29/2016 Document Reviewed: 04/09/2015 Elsevier Interactive Patient Education  2018 [place]. Farhiya Rosten M.D.

## 2017-05-16 NOTE — Patient Instructions (Addendum)
The arm rash acts like  Contact dermatitis.    The breast, rash could be from  Intertrigo   irritation  And  Sometimes yeast ....  Can give you the  pyrmetrin  5%  If needed.   Use the strong cortisone cream on the arms You can use an over-the-counter 1% hydrocortisone in the breast area and keep as dry as possible   Ask your insurance about reimbursement coverage for the nutrition greatest vaccine and call for an appointment to get the injection at any time if you wish.   Contact Dermatitis Dermatitis is redness, soreness, and swelling (inflammation) of the skin. Contact dermatitis is a reaction to certain substances that touch the skin. There are two types of contact dermatitis:  Irritant contact dermatitis. This type is caused by something that irritates your skin, such as dry hands from washing them too much. This type does not require previous exposure to the substance for a reaction to occur. This type is more common.  Allergic contact dermatitis. This type is caused by a substance that you are allergic to, such as a nickel allergy or poison ivy. This type only occurs if you have been exposed to the substance (allergen) before. Upon a repeat exposure, your body reacts to the substance. This type is less common.  What are the causes? Many different substances can cause contact dermatitis. Irritant contact dermatitis is most commonly caused by exposure to:  Makeup.  Soaps.  Detergents.  Bleaches.  Acids.  Metal salts, such as nickel.  Allergic contact dermatitis is most commonly caused by exposure to:  Poisonous plants.  Chemicals.  Jewelry.  Latex.  Medicines.  Preservatives in products, such as clothing.  What increases the risk? This condition is more likely to develop in:  People who have jobs that expose them to irritants or allergens.  People who have certain medical conditions, such as asthma or eczema.  What are the signs or symptoms? Symptoms of  this condition may occur anywhere on your body where the irritant has touched you or is touched by you. Symptoms include:  Dryness or flaking.  Redness.  Cracks.  Itching.  Pain or a burning feeling.  Blisters.  Drainage of small amounts of blood or clear fluid from skin cracks.  With allergic contact dermatitis, there may also be swelling in areas such as the eyelids, mouth, or genitals. How is this diagnosed? This condition is diagnosed with a medical history and physical exam. A patch skin test may be performed to help determine the cause. If the condition is related to your job, you may need to see an occupational medicine specialist. How is this treated? Treatment for this condition includes figuring out what caused the reaction and protecting your skin from further contact. Treatment may also include:  Steroid creams or ointments. Oral steroid medicines may be needed in more severe cases.  Antibiotics or antibacterial ointments, if a skin infection is present.  Antihistamine lotion or an antihistamine taken by mouth to ease itching.  A bandage (dressing).  Follow these instructions at home: [place] your skin as needed.  Apply cool compresses to the affected areas.  Try taking a bath with: ? Epsom salts. Follow the instructions on the packaging. You can get these at your local pharmacy or grocery store. ? Baking soda. Pour a small amount into the bath as directed by your health care provider. ? Colloidal oatmeal. Follow the instructions on the packaging. You can get this at  your local pharmacy or grocery store.  Try applying baking soda paste to your skin. Stir water into baking soda until it reaches a paste-like consistency.  Do not scratch your skin.  Bathe less frequently, such as every other day.  Bathe in lukewarm water. Avoid using hot water. Medicines  Take or apply over-the-counter and prescription medicines only as told by your health  care provider.  If you were prescribed an antibiotic medicine, take or apply your antibiotic as told by your health care provider. Do not stop using the antibiotic even if your condition starts to improve. General instructions  Keep all follow-up visits as told by your health care provider. This is important.  Avoid the substance that caused your reaction. If you do not know what caused it, keep a journal to try to track what caused it. Write down: ? What you eat. ? What cosmetic products you use. ? What you drink. ? What you wear in the affected area. This includes jewelry.  If you were given a dressing, take care of it as told by your health care provider. This includes when to change and remove it. Contact a health care provider if:  Your condition does not improve with treatment.  Your condition gets worse.  You have signs of infection such as swelling, tenderness, redness, soreness, or warmth in the affected area.  You have a fever.  You have new symptoms. Get help right away if:  You have a severe headache, neck pain, or neck stiffness.  You vomit.  You feel very sleepy.  You notice red streaks coming from the affected area.  Your bone or joint underneath the affected area becomes painful after the skin has healed.  The affected area turns darker.  You have difficulty breathing. This information is not intended to replace advice given to you by your health care provider. Make sure you discuss any questions you have with your health care provider. Document Released: 11/19/2000 Document Revised: 04/29/2016 Document Reviewed: 04/09/2015 Elsevier Interactive Patient Education  2018 [employer].

## 2017-05-25 ENCOUNTER — Telehealth: Payer: Self-pay | Admitting: Internal Medicine

## 2017-05-25 NOTE — Telephone Encounter (Signed)
Just spoke with the pharmacist and she picked up a prescription last month on the 30th. This prescription was sent in last week by Santina Evans. Her insurance will not fill it because she is trying to fill it too early. According to the pharmacist she has a couple days before it can be filled.

## 2017-05-25 NOTE — Telephone Encounter (Signed)
Pt will be going to [place] and would like omeprazole 40 mg #90 [facility] [address]

## 2017-05-26 ENCOUNTER — Telehealth: Payer: Self-pay | Admitting: Internal Medicine

## 2017-05-26 NOTE — Telephone Encounter (Signed)
Spoke with pt and she states that rash has now spread to her extremities and abdomen. She wanted to know if it was to use Lidex on body. Advised her that she can apply to any rash area. She is concerned about the spread and that she is due to leave for [place] on Monday. Advised pt to schedule OV for further assessment. Pt agreed. OV scheduled with Dr. Regis Bill for tomorrow morning. Pt agrees. Nothing further needed at this time.

## 2017-05-26 NOTE — Telephone Encounter (Signed)
Pt seen Dr Regis Bill on 6/11 with contact dermatitis. Pt states it is worse, has gotten all over her body. Wants to know if ok to put the med she was given on other parts of her body.  Really not sure what to do now. Please advise.

## 2017-05-26 NOTE — Telephone Encounter (Signed)
LMTCB

## 2017-05-27 ENCOUNTER — Ambulatory Visit (INDEPENDENT_AMBULATORY_CARE_PROVIDER_SITE_OTHER): Payer: BLUE CROSS/BLUE SHIELD | Admitting: Internal Medicine

## 2017-05-27 ENCOUNTER — Encounter: Payer: Self-pay | Admitting: Internal Medicine

## 2017-05-27 VITALS — BP 120/78 | HR 71 | Temp 97.5°F

## 2017-05-27 DIAGNOSIS — L259 Unspecified contact dermatitis, unspecified cause: Secondary | ICD-10-CM | POA: Diagnosis not present

## 2017-05-27 MED ORDER — PREDNISONE 10 MG PO TABS: ORAL_TABLET | ORAL | 0 refills | Status: DC

## 2017-05-27 NOTE — Progress Notes (Signed)
Chief Complaint  Patient presents with  . Rash    HPI: Natasha [name] 66 y.o.  Comes in today cause rash has spread  Thea res of rx are much better but now  Lateral thight right breast .  Lower abdomina and r antecubital area . Agrees not scabies     Checked on shingrix and ok if in network and no co pau if inj only   No fever no other exposures  leaving in 3 days for [place]  ROS: See pertinent positives and negatives per HPI.  Past Medical History:  Diagnosis Date  . Allergic rhinitis   . Normal cardiac stress test     Family History  Problem Relation Age of Onset  . Lung disease Father   . Cancer Father        Colon  . Colon polyps Father   . Multiple sclerosis Mother   . Scleroderma Sister   . Parkinson's disease Maternal Grandmother   . Colon cancer Neg Hx     Social History   Social History  . Marital status: Married    Spouse name: N/A  . Number of children: N/A  . Years of education: N/A   Social History Main Topics  . Smoking status: Never Smoker  . Smokeless tobacco: Never Used  . Alcohol use 0.5 oz/week    1 Standard drinks or equivalent per week     Comment: 1 every 2 weeks   . Drug use: No  . Sexual activity: Not Asked   Other Topics Concern  . 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    No tob    Outpatient Medications Prior to Visit  Medication Sig Dispense Refill  . fluocinonide-emollient (LIDEX-E) 0.05 % cream Apply 1 application topically 2 (two) times daily. To arm rash 30 g 1  . omeprazole (PRILOSEC) 40 MG capsule Take 1 capsule (40 mg total) by mouth daily before breakfast. 90 capsule 1  . permethrin (ELIMITE) 5 % cream Apply 5% cream to entire body from the neck down,  wash after 8 to 14 hours can repeat in 14 days 60 g 0   Facility-Administered Medications Prior to Visit  Medication Dose Route Frequency  Provider Last Rate Last Dose  . cyanocobalamin ((VITAMIN B-12)) injection 1,000 mcg  1,000 mcg Intramuscular Weekly Roseana Rhine, Standley Brooking, MD   1,000 mcg at 09/06/16 1303     EXAM:  BP 120/78 (BP Location: Right Arm, Patient Position: Sitting, Cuff Size: Large)   Pulse 71   Temp 97.5 F (36.4 C) (Oral)   There is no height or weight on file to calculate BMI.  GENERAL: vitals reviewed and listed above, alert, oriented, appears well hydrated and in no acute distress HEENT: atraumatic, conjunctiva  clear, no obvious abnormalities on inspection of external nose and ears  Skin  Faded  Arm rash  Blotches of excoriated   Left thight and early redness right antecubital  Breast right is pink nbo lesions  Blotchy . No vesicles or pustules  Or burrows anywhere. PSYCH: pleasant and cooperative, no obvious depression or anxiety  ASSESSMENT AND PLAN:  Discussed the following assessment and plan:  Contact dermatitis, unspecified contact dermatitis type, unspecified trigger May add pred if needed on trip  directions discussed  -Patient advised to return or notify health  care team  if symptoms worsen ,persist or new concerns arise.  Patient Instructions  Consider taking a course of prednisone  because the rash is so extensive. You can decrease the dosing quicker than what is prescribed if you're doing well. Okay to take antihistamines for itching I do not think this is scabies as we discussed. You can come back without co-pay for injection only on the new shingles vaccine as we discussed. Since her insurance will cover it.    Standley Brooking. Wolfgang Finigan M.D.

## 2017-05-27 NOTE — Patient Instructions (Signed)
Consider taking a course of prednisone  because the rash is so extensive. You can decrease the dosing quicker than what is prescribed if you're doing well. Okay to take antihistamines for itching I do not think this is scabies as we discussed. You can come back without co-pay for injection only on the new shingles vaccine as we discussed. Since her insurance will cover it.

## 2017-08-09 ENCOUNTER — Ambulatory Visit: Payer: BLUE CROSS/BLUE SHIELD | Admitting: Family Medicine

## 2017-08-10 ENCOUNTER — Ambulatory Visit (INDEPENDENT_AMBULATORY_CARE_PROVIDER_SITE_OTHER): Payer: BLUE CROSS/BLUE SHIELD | Admitting: Family Medicine

## 2017-08-10 ENCOUNTER — Ambulatory Visit (INDEPENDENT_AMBULATORY_CARE_PROVIDER_SITE_OTHER)
Admission: RE | Admit: 2017-08-10 | Discharge: 2017-08-10 | Disposition: A | Discharge status: Home / Self Care | Payer: BLUE CROSS/BLUE SHIELD | Source: Ambulatory Visit | Attending: Family Medicine | Admitting: Family Medicine

## 2017-08-10 ENCOUNTER — Encounter: Payer: Self-pay | Admitting: Family Medicine

## 2017-08-10 VITALS — BP 132/84 | HR 63 | Ht 66.0 in

## 2017-08-10 DIAGNOSIS — M5416 Radiculopathy, lumbar region: Secondary | ICD-10-CM | POA: Diagnosis not present

## 2017-08-10 MED ORDER — DICLOFENAC SODIUM 2 % TD SOLN: 2.0000 "application " | Freq: Two times a day (BID) | TRANSDERMAL | 3 refills | Status: DC

## 2017-08-10 MED ORDER — GABAPENTIN 100 MG PO CAPS: 200.0000 mg | ORAL_CAPSULE | Freq: Every day | ORAL | 3 refills | Status: DC

## 2017-08-10 MED ORDER — VITAMIN D (ERGOCALCIFEROL) 1.25 MG (50000 UNIT) PO CAPS: 50000.0000 [IU] | ORAL_CAPSULE | ORAL | 0 refills | Status: DC

## 2017-08-10 NOTE — Assessment & Plan Note (Signed)
Patient does have a positive straight leg test but no weakness. I do believe the patient is having some degenerative disc disease that could be contribute in. X-rays are pending. Topical anti-inflammatory's prescribed, discussed gabapentin at night. Once weekly vitamin D for muscle strength and endurance. Work with Product/process development scientist to home exercises in greater detail. Follow-up again with me 4 weeks

## 2017-08-10 NOTE — Patient Instructions (Signed)
Good to see you.  Ice 20 minutes 2 times daily. Usually after activity and before bed. Exercises 3 times a week.  pennsaid pinkie amount topically 2 times daily as needed.   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  Xrays downstairs today  Over the counter try tart cherry extract any dose at night See me again in 4 weeks.

## 2017-08-10 NOTE — Progress Notes (Signed)
[name] [address], [place] [address] Phone: [phone] Subjective:    I'm seeing this patient by the request  of:  Panosh, Standley Brooking, MD   CC: low back pain   VVO:[id number]  Natasha [name] is a 66 y.o. female coming in with complaint of Low back pain. Patient states that this is been intermittent for 2 years but increasing in severity. States that it seems to radiate down the lateral aspect of the hip all the way down to the lateral aspect of her foot. Denies any weakness. States that symptoms of the pain can be debilitating. Can wake her up at night. Denies any fevers chills any abnormal weight loss. Patient is concerned though because she is noticing she is doing less and less activity. Rates the severity of pain a 7 out of 10      Past Medical History:  Diagnosis Date  . Allergic rhinitis   . Normal cardiac stress test    Past Surgical History:  Procedure Laterality Date  . MYRINGOTOMY     right 7 th grade   Social History   Social History  . Marital status: Married    Spouse name: N/A  . Number of children: N/A  . Years of education: N/A   Social History Main Topics  . Smoking status: Never Smoker  . Smokeless tobacco: Never Used  . Alcohol use 0.5 oz/week    1 Standard drinks or equivalent per week     Comment: 1 every 2 weeks   . Drug use: No  . Sexual activity: Not on file   Other Topics Concern  . 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    No tob   No Known Allergies Family History  Problem Relation Age of Onset  . Lung disease Father   . Cancer Father        Colon  . Colon polyps Father   . Multiple sclerosis Mother   . Scleroderma Sister   . Parkinson's disease Maternal Grandmother   . Colon cancer Neg Hx      Past medical history, social, surgical and family history all  reviewed in electronic medical record.  No pertanent information unless stated regarding to the chief complaint.   Review of Systems:Review of systems updated and as accurate as of 08/10/17  No headache, visual changes, nausea, vomiting, diarrhea, constipation, dizziness, abdominal pain, skin rash, fevers, chills, night sweats, weight loss, swollen lymph nodes, body aches, joint swelling, muscle aches, chest pain, shortness of breath, mood changes.   Objective  There were no vitals taken for this visit. Systems examined below as of 08/10/17   General: No apparent distress alert and oriented x3 mood and affect normal, dressed appropriately.  HEENT: Pupils equal, extraocular movements intact  Respiratory: Patient's speak in full sentences and does not appear short of breath  Cardiovascular: No lower extremity edema, non tender, no erythema  Skin: Warm dry intact with no signs of infection or rash on extremities or on axial skeleton.  Abdomen: Soft nontender  Neuro: Cranial nerves II through XII are intact, neurovascularly intact in all extremities with 2+ DTRs and 2+ pulses.  Lymph: No lymphadenopathy of posterior or anterior cervical chain or axillae bilaterally.  Gait normal with good balance and coordination.  MSK:  Non tender with full range of motion and good stability and symmetric strength and tone of shoulders, elbows, wrist, hip, knee and ankles bilaterally. Arthritic changes of multiple joints Back Exam:  Inspection: Mild increase in lordosis Motion: Flexion 35 deg, Extension 15 deg, Side Bending to 25 deg bilaterally,  Rotation to 25 deg bilaterally  SLR laying: Mild increasing discomfort over the lateral aspect of the hip but no true radicular symptoms XSLR laying: Negative  Palpable tenderness: Tender to palpation appears to musculature lumbar spine right greater than left. FABER: Increased tightness on the right side. Sensory change: Gross sensation intact to all lumbar and  sacral dermatomes.  Reflexes: 2+ at both patellar tendons, 2+ at achilles tendons, Babinski's downgoing.  Strength at foot  Plantar-flexion: 5/5 Dorsi-flexion: 5/5 Eversion: 5/5 Inversion: 5/5  Leg strength  Quad: 5/5 Hamstring: 5/5 Hip flexor: 5/5 Hip abductors: 5/5  Gait unremarkable.  [id number]; 15 additional minutes spent for Therapeutic exercises as stated in above notes.  This included exercises focusing on stretching, strengthening, with significant focus on eccentric aspects.   Long term goals include an improvement in range of motion, strength, endurance as well as avoiding reinjury. Patient's frequency would include in 1-2 times a day, 3-5 times a week for a duration of 6-12 weeks. Low back exercises that included:  Pelvic tilt/bracing instruction to focus on control of the pelvic girdle and lower abdominal muscles  Glute strengthening exercises, focusing on proper firing of the glutes without engaging the low back muscles Proper stretching techniques for maximum relief for the hamstrings, hip flexors, low back and some rotation where tolerated   Proper technique shown and discussed handout in great detail with ATC.  All questions were discussed and answered.     Impression and Recommendations:     This case required medical decision making of moderate complexity.      Note: This dictation was prepared with Dragon dictation along with smaller phrase technology. Any transcriptional errors that result from this process are unintentional.

## 2017-08-26 ENCOUNTER — Encounter: Payer: Self-pay | Admitting: Internal Medicine

## 2017-08-26 ENCOUNTER — Telehealth: Payer: Self-pay | Admitting: Internal Medicine

## 2017-08-26 NOTE — Telephone Encounter (Signed)
Please advise Dr Panosh, thanks.   

## 2017-08-26 NOTE — Telephone Encounter (Signed)
Ok to give  Hepatitis vaccine with other vaccines ? Hep b or twinrix   ( A and B)

## 2017-08-26 NOTE — Telephone Encounter (Signed)
° ° ° °  Pt is coming in on 09/02/17 for flu shot and shingles. Pt would also like a hep injection will this hep b ok and if so which one does she need

## 2017-08-29 ENCOUNTER — Telehealth: Payer: Self-pay | Admitting: Internal Medicine

## 2017-08-29 NOTE — Telephone Encounter (Signed)
Pt would like an order for a hep c screening

## 2017-08-30 NOTE — Telephone Encounter (Addendum)
According to [facility] the patient has never had the Hep B Vaccine but has had the Hep A in 2002.  Pt is due for Hep A 2nd round  If wishes to get Hep B will need to start series.  Notes added to upcoming vaccine appt 09/02/17

## 2017-09-02 ENCOUNTER — Ambulatory Visit (INDEPENDENT_AMBULATORY_CARE_PROVIDER_SITE_OTHER): Payer: BLUE CROSS/BLUE SHIELD | Admitting: Emergency Medicine

## 2017-09-02 DIAGNOSIS — Z23 Encounter for immunization: Secondary | ICD-10-CM | POA: Diagnosis not present

## 2017-09-05 ENCOUNTER — Other Ambulatory Visit: Payer: Self-pay | Admitting: Emergency Medicine

## 2017-09-05 DIAGNOSIS — Z1159 Encounter for screening for other viral diseases: Secondary | ICD-10-CM

## 2017-09-05 NOTE — Telephone Encounter (Signed)
Patient was just in the office last week for Shingrix and flu and would like to discuss with Dr. Regis Bill whether she would like for to get another Hep A or twinrix. Patient states that she will give the office a call back to schedule an appointment. As of now she wants to hold off on lab and vaccines.

## 2017-09-05 NOTE — Telephone Encounter (Signed)
Please advise if okay to place order. Thank you

## 2017-09-05 NOTE — Telephone Encounter (Signed)
This message was sent to my desk top when I was out of the office. For the week   Okay to place order for hep C screening.

## 2017-09-07 ENCOUNTER — Encounter: Payer: Self-pay | Admitting: Family Medicine

## 2017-09-07 ENCOUNTER — Ambulatory Visit (INDEPENDENT_AMBULATORY_CARE_PROVIDER_SITE_OTHER): Payer: BLUE CROSS/BLUE SHIELD | Admitting: Family Medicine

## 2017-09-07 DIAGNOSIS — M5416 Radiculopathy, lumbar region: Secondary | ICD-10-CM

## 2017-09-07 MED ORDER — PREDNISONE 50 MG PO TABS: 50.0000 mg | ORAL_TABLET | Freq: Every day | ORAL | 0 refills | Status: DC

## 2017-09-07 MED ORDER — VITAMIN D (ERGOCALCIFEROL) 1.25 MG (50000 UNIT) PO CAPS: 50000.0000 [IU] | ORAL_CAPSULE | ORAL | 0 refills | Status: DC

## 2017-09-07 NOTE — Patient Instructions (Addendum)
Good to see you  I am sorry not a lot of improvement Try the once weekly vitamin D for 12 weeks.  Prednisone I sent in  Tylenol 500mg  3 times a day is the best for arthritis Aleve 2 times a day if in a lot of pain.  Consider the turmeric again.  Consider either a piriformis injection we can do here but will need a driver Or we will need MRI of the back to see if an epidural would be helpful  See me again in 3-4 weeks.

## 2017-09-07 NOTE — Assessment & Plan Note (Signed)
Spent  25 minutes with patient face-to-face and had greater than 50% of counseling including as described in assessment and plan.Continues to have radicular symptoms. Likely some of it could be from some of the arthritic changes noted on x-ray previously. Piriformis syndrome with nerve impingement is also within the differential. Discussed with patient at great length. Given prednisone to try to decrease the pain. Encourage her to do the home exercises. Decline formal physical therapy. Decline multiple other medications we discussed today. We discussed the possibility of MRI for further evaluation she would be a candidate for epidural injections and needed. We also discussed with patient about the possibility of a piriformis injection for diagnostic as well as therapeutic purposes. Patient wants to hold on this. Will follow-up again in 4 weeks

## 2017-09-07 NOTE — Progress Notes (Signed)
[name] [place] [place], [place] [address] Phone: [phone] Subjective:    I'm seeing this patient by the request  of:  Panosh, Standley Brooking, MD   CC: low back pain Follow-up  GUR:[id number]  Natasha [name] is a 66 y.o. female coming in with complaint of Low back pain. Has been chronic. Was found to have significant facet arthropathy at L3-L4 warm as well as a mild spondylolisthesis at L4-L5. Patient was trying conservative therapy. Has been fairly noncompliant on the medications. Patient though is still frustrated and feels like she should be better. Patient states that it is not debilitating but still makes it difficult to do certain activities. Patient states that she has never without pain. Continues to have radicular symptoms down the right leg     Past Medical History:  Diagnosis Date  . Allergic rhinitis   . Normal cardiac stress test    Past Surgical History:  Procedure Laterality Date  . MYRINGOTOMY     right 7 th grade   Social History   Social History  . Marital status: Married    Spouse name: N/A  . Number of children: N/A  . Years of education: N/A   Social History Main Topics  . Smoking status: Never Smoker  . Smokeless tobacco: Never Used  . Alcohol use 0.5 oz/week    1 Standard drinks or equivalent per week     Comment: 1 every 2 weeks   . Drug use: No  . Sexual activity: Not Asked   Other Topics Concern  . 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    No tob   No Known Allergies Family History  Problem Relation Age of Onset  . Lung disease Father   . Cancer Father        Colon  . Colon polyps Father   . Multiple sclerosis Mother   . Scleroderma Sister   . Parkinson's disease Maternal Grandmother   . Colon cancer Neg Hx      Past medical history, social, surgical and family history  all reviewed in electronic medical record.  No pertanent information unless stated regarding to the chief complaint.   Review of Systems:Review of systems updated and as accurate as of 09/07/17  No headache, visual changes, nausea, vomiting, diarrhea, constipation, dizziness, abdominal pain, skin rash, fevers, chills, night sweats, weight loss, swollen lymph nodes, body aches, joint swelling,  chest pain, shortness of breath, mood changes. Positive muscle aches  Objective  Blood pressure 110/80, pulse 75, height 5\' 6"  (1.676 m), SpO2 97 %. Systems examined below as of 09/07/17   Systems examined below as of 09/07/17 General: NAD A&O x3 mood, affect normal  HEENT: Pupils equal, extraocular movements intact no nystagmus Respiratory: not short of breath at rest or with speaking Cardiovascular: No lower extremity edema, non tender Skin: Warm dry intact with no signs of infection or rash on extremities or on axial skeleton. Abdomen: Soft nontender, no masses Neuro: Cranial nerves  intact, neurovascularly intact in all extremities with 2+ DTRs and 2+ pulses. Gait normal with good balance and coordination.  MSK:  Non tender with full range of motion and good stability and symmetric strength and tone of shoulders, elbows, wrist, hip, knee and ankles bilaterally. Arthritic changes of multiple joints Back  Exam:  Inspection: Mild increase in lordosis Motion: Flexion 35 deg, Extension 15 deg, Side Bending to 25 deg bilaterally,  Rotation to 25 deg bilaterally  SLR laying: Positive on the right side. Worse than previous exam XSLR laying: Negative  Palpable tenderness: Tender to palpation appears to musculature lumbar spine right greater than left. Worse than previous exam FABER: Right side positive Sensory change: Gross sensation intact to all lumbar and sacral dermatomes.  Reflexes: 2+ at both patellar tendons, 2+ at achilles tendons, Babinski's downgoing.  Strength at foot  Plantar-flexion: 5/5  Dorsi-flexion: 5/5 Eversion: 5/5 Inversion: 5/5  Leg strength  Quad: 5/5 Hamstring: 5/5 Hip flexor: 5/5 Hip abductors: 5/5  Gait unremarkable.      Impression and Recommendations:     This case required medical decision making of moderate complexity.      Note: This dictation was prepared with Dragon dictation along with smaller phrase technology. Any transcriptional errors that result from this process are unintentional.

## 2017-11-11 ENCOUNTER — Ambulatory Visit (INDEPENDENT_AMBULATORY_CARE_PROVIDER_SITE_OTHER): Payer: BLUE CROSS/BLUE SHIELD

## 2017-11-11 DIAGNOSIS — Z23 Encounter for immunization: Secondary | ICD-10-CM | POA: Diagnosis not present

## 2017-11-23 ENCOUNTER — Telehealth: Payer: Self-pay | Admitting: Internal Medicine

## 2017-11-23 MED ORDER — PREDNISONE 50 MG PO TABS: 50.0000 mg | ORAL_TABLET | Freq: Every day | ORAL | 0 refills | Status: DC

## 2017-11-23 NOTE — Telephone Encounter (Signed)
Prescription   Request    Refill   For  Prednisone     Saw   Dr  Tamala Julian   On   09/07/2017   -

## 2017-11-23 NOTE — Telephone Encounter (Signed)
Pt made aware & scheduled f/u appt.

## 2017-11-23 NOTE — Telephone Encounter (Signed)
Copied from [place] [id number]. Topic: Quick Communication - See Telephone Encounter >> Nov 23, 2017  9:30 AM Synthia Innocent wrote: CRM for notification. See Telephone encounter for: Requesting refill on predniSONE (DELTASONE) 50 MG tablet, having nerve pain in sciatica. [facility] 11/23/17.

## 2017-11-23 NOTE — Telephone Encounter (Signed)
7-10 but patient would need to be seen within the next 3-4 weeks.  Any worsening symptoms need to see patient immediately or have her see 1 of our other sports medicine providers.

## 2017-12-13 NOTE — Progress Notes (Signed)
[name] [place], [place] [address] Phone: [phone] Subjective:    I'm seeing this patient by the request  of:    CC: Back pain follow-up  GUR:[id number]  Natasha [name] is a 67 y.o. female coming in with complaint of low back pain.  Patient was having what seemed to be more of a lumbar radiculopathy.  Started on gabapentin and vitamin D and topical anti-inflammatories.  She did have x-rays that were independently visualized by me showing lumbar facet arthropathy at L3-L4 and a grade 1 anterior listhesis at L4-L5.  Patient was noncompliant with home exercises and was to consider formal physical therapy.  Patient states that her back pain is getting progressively worse. She has not felt any difference from the gabapentin and vitamin d. She said that tylenol eases her pain though. The 5 day course of prednisone also did not help her verses a 7 day course that she has been prescribed from another doctor. She has a vacation coming up and wants to get her back right before the vacation.      Past Medical History:  Diagnosis Date  . Allergic rhinitis   . Normal cardiac stress test    Past Surgical History:  Procedure Laterality Date  . MYRINGOTOMY     right 7 th grade   Social History   Socioeconomic History  . Marital status: Married    Spouse name: None  . Number of children: None  . Years of education: None  . Highest education level: None  Social Needs  . Financial resource strain: None  . Food insecurity - worry: None  . Food insecurity - inability: None  . Transportation needs - medical: None  . Transportation needs - non-medical: None  Occupational History  . None  Tobacco Use  . Smoking status: Never Smoker  . Smokeless tobacco: Never Used  Substance and Sexual Activity  . Alcohol use: Yes    Alcohol/week: 0.5 oz    Types: 1 Standard drinks or equivalent per week    Comment: 1 every 2 weeks   . Drug use: No  .  Sexual activity: None  Other Topics Concern  . 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    No tob   No Known Allergies Family History  Problem Relation Age of Onset  . Lung disease Father   . Cancer Father        Colon  . Colon polyps Father   . Multiple sclerosis Mother   . Scleroderma Sister   . Parkinson's disease Maternal Grandmother   . Colon cancer Neg Hx      Past medical history, social, surgical and family history all reviewed in electronic medical record.  No pertanent information unless stated regarding to the chief complaint.   Review of Systems:Review of systems updated and as accurate as of 12/14/17  No headache, visual changes, nausea, vomiting, diarrhea, constipation, dizziness, abdominal pain, skin rash, fevers, chills, night sweats, weight loss, swollen lymph nodes, body aches, joint swelling,  chest pain, shortness of breath, mood changes.  Positive muscle aches  Objective  Blood pressure 118/72, pulse 82, SpO2 92 %. Systems examined below as of 12/14/17   General: No apparent distress alert and oriented x3 mood and affect normal, dressed appropriately.  HEENT: Pupils  equal, extraocular movements intact  Respiratory: Patient's speak in full sentences and does not appear short of breath  Cardiovascular: No lower extremity edema, non tender, no erythema  Skin: Warm dry intact with no signs of infection or rash on extremities or on axial skeleton.  Abdomen: Soft nontender  Neuro: Cranial nerves II through XII are intact, neurovascularly intact in all extremities with 1+ DTRs and 2+ pulses.  Lymph: No lymphadenopathy of posterior or anterior cervical chain or axillae bilaterally.  Gait mild antalgic gait MSK:  Non tender with full range of motion and good stability and symmetric strength and tone of shoulders, elbows, wrist, hip, knee and ankles  bilaterally.  Mild arthritic changes Back Exam:  Inspection: Mild loss of lordosis Motion: Flexion 35 deg, Extension 15 deg with worsening symptoms going down the leg, Side Bending to 35 deg bilaterally,  Rotation to 35 deg bilaterally  SLR laying: Positive right XSLR laying: Negative  Palpable tenderness: Tender to palpation in the paraspinal musculature lumbar spine right greater than left.Marland Kitchen FABER: Positive right. Sensory change: Gross sensation intact to all lumbar and sacral dermatomes.  Reflexes: 2+ at both patellar tendons, 1+ at achilles tendons, Babinski's downgoing.  Strength at foot  4 out of 5 strength of the dorsiflexion bilaterally   Impression and Recommendations:     This case required medical decision making of moderate complexity.      Note: This dictation was prepared with Dragon dictation along with smaller phrase technology. Any transcriptional errors that result from this process are unintentional.

## 2017-12-14 ENCOUNTER — Encounter: Payer: Self-pay | Admitting: Family Medicine

## 2017-12-14 ENCOUNTER — Ambulatory Visit: Payer: BLUE CROSS/BLUE SHIELD | Admitting: Family Medicine

## 2017-12-14 VITALS — BP 118/72 | HR 82

## 2017-12-14 DIAGNOSIS — M4807 Spinal stenosis, lumbosacral region: Secondary | ICD-10-CM | POA: Diagnosis not present

## 2017-12-14 DIAGNOSIS — M5416 Radiculopathy, lumbar region: Secondary | ICD-10-CM

## 2017-12-14 NOTE — Patient Instructions (Signed)
Good to see you  Natasha [name] is your friend.  Continue the tylenol  Stay active  We will get MRI  Depending on this lets then consider epidural  IF we do injection then I want to see you again 2-3 weeks after Happy New Year!

## 2017-12-14 NOTE — Assessment & Plan Note (Signed)
Patient is some more radicular symptoms again.  Now seems to be more secondary to his spinal stenosis.  MRI ordered today secondary to worsening symptoms, weakness, and decreasing deep tendon reflexes.  Affecting daily activities were patient cannot walk greater than 200 feet.  Patient's likely would be a candidate for epidural injections.  Follow-up again after the MRI and discuss further evaluation.

## 2017-12-28 ENCOUNTER — Other Ambulatory Visit: Payer: BLUE CROSS/BLUE SHIELD

## 2018-01-10 ENCOUNTER — Telehealth: Payer: Self-pay | Admitting: Family Medicine

## 2018-01-10 NOTE — Telephone Encounter (Signed)
Copied from [place] [id number]. Topic: Quick Communication - See Telephone Encounter >> Jan 10, 2018  2:00 PM Burnis Medin, NT wrote: CRM for notification. See Telephone encounter for: Dorian Pod called and said patients MRI requires new authorization. The authorization that was obtained has expired. Pls call back at  [phone]  01/10/18.

## 2018-01-13 NOTE — Telephone Encounter (Signed)
Pt's MRI has been reprecerted & approved.

## 2018-01-16 ENCOUNTER — Ambulatory Visit
Admission: RE | Admit: 2018-01-16 | Discharge: 2018-01-16 | Disposition: A | Discharge status: Home / Self Care | Payer: BLUE CROSS/BLUE SHIELD | Source: Ambulatory Visit | Attending: Family Medicine | Admitting: Family Medicine

## 2018-01-16 DIAGNOSIS — M4807 Spinal stenosis, lumbosacral region: Secondary | ICD-10-CM

## 2018-01-23 NOTE — Progress Notes (Signed)
[name] [place], [place] [address] Phone: [phone] Subjective:    I'm seeing this patient by the request  of:    CC: Low back pain follow-up  [id number]  Natasha [name] is a 67 y.o. female coming in with complaint of low back pain.  Was having radicular symptoms with weakness of the lower extremity.  Responded to prednisone but then started getting worsening again.  Had a positive straight leg test.  Patient did have advanced imaging including MRI.  Independently visualized by me showing spinal stenosis at L3-L4. Patient states continues to have discomfort and pain.  Affecting quality of life.  Patient is going on 3 trips this year and wants to be able to ambulate better than she is at this point.  Pain even wakes her up at night.    Past Medical History:  Diagnosis Date  . Allergic rhinitis   . Normal cardiac stress test    Past Surgical History:  Procedure Laterality Date  . MYRINGOTOMY     right 7 th grade   Social History   Socioeconomic History  . Marital status: Married    Spouse name: None  . Number of children: None  . Years of education: None  . Highest education level: None  Social Needs  . Financial resource strain: None  . Food insecurity - worry: None  . Food insecurity - inability: None  . Transportation needs - medical: None  . Transportation needs - non-medical: None  Occupational History  . None  Tobacco Use  . Smoking status: Never Smoker  . Smokeless tobacco: Never Used  Substance and Sexual Activity  . Alcohol use: Yes    Alcohol/week: 0.5 oz    Types: 1 Standard drinks or equivalent per week    Comment: 1 every 2 weeks   . Drug use: No  . Sexual activity: None  Other Topics Concern  . 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    No tob   No Known Allergies Family  History  Problem Relation Age of Onset  . Lung disease Father   . Cancer Father        Colon  . Colon polyps Father   . Multiple sclerosis Mother   . Scleroderma Sister   . Parkinson's disease Maternal Grandmother   . Colon cancer Neg Hx      Past medical history, social, surgical and family history all reviewed in electronic medical record.  No pertanent information unless stated regarding to the chief complaint.   Review of Systems:Review of systems updated and as accurate  No headache, visual changes, nausea, vomiting, diarrhea, constipation, dizziness, abdominal pain, skin rash, fevers, chills, night sweats, weight loss, swollen lymph nodes, body aches, joint swelling, chest pain, shortness of breath, mood changes.  Positive muscle aches  Objective  Blood pressure 138/80, pulse 94, height 5\' 6"  (1.676 m), SpO2 98 %.   General: No apparent distress alert and oriented x3 mood and affect normal, dressed appropriately.  HEENT: Pupils equal, extraocular movements intact  Respiratory: Patient's speak in full sentences and does not appear short of breath  Cardiovascular: No lower extremity edema, non tender, no erythema  Skin: Warm dry intact with no signs of infection or rash on extremities or on axial skeleton.  Abdomen: Soft  nontender  Neuro: Cranial nerves II through XII are intact, neurovascularly intact in all extremities with 2+ DTRs and 2+ pulses.  Lymph: No lymphadenopathy of posterior or anterior cervical chain or axillae bilaterally.  Gait normal with good balance and coordination.  MSK:  Non tender with full range of motion and good stability and symmetric strength and tone of shoulders, elbows, wrist, hip, knee and ankles bilaterally.  Back Exam:  Inspection: Mild loss of lordosis with degenerative scoliosis Motion: Flexion 35 deg, Extension 15 deg, Side Bending to 35 deg bilaterally,  Rotation to 35 deg bilaterally  SLR laying: Negative  XSLR laying: Negative  Palpable  tenderness: Tenderness to palpation in the paraspinal musculature.Marland Kitchen FABER: negative. Sensory change: Gross sensation intact to all lumbar and sacral dermatomes.  Reflexes: 2+ at both patellar tendons, 2+ at achilles tendons, Babinski's downgoing.  Strength at foot  Plantar-flexion: 5/5 Dorsi-flexion: 5/5 Eversion: 5/5 Inversion: 5/5  Leg strength  4 out of 5 but symmetric     Impression and Recommendations:     This case required medical decision making of moderate complexity.      Note: This dictation was prepared with Dragon dictation along with smaller phrase technology. Any transcriptional errors that result from this process are unintentional.

## 2018-01-24 ENCOUNTER — Encounter: Payer: Self-pay | Admitting: Family Medicine

## 2018-01-24 ENCOUNTER — Ambulatory Visit: Payer: BLUE CROSS/BLUE SHIELD | Admitting: Family Medicine

## 2018-01-24 VITALS — BP 138/80 | HR 94 | Ht 66.0 in

## 2018-01-24 DIAGNOSIS — M549 Dorsalgia, unspecified: Secondary | ICD-10-CM

## 2018-01-24 DIAGNOSIS — M48062 Spinal stenosis, lumbar region with neurogenic claudication: Secondary | ICD-10-CM | POA: Diagnosis not present

## 2018-01-24 NOTE — Patient Instructions (Signed)
Good to see you  Spinal stenosis at L3/4 and matches your symptoms We will get an epidural done  [phone] if you want to call them  See me again then 2-3 weeks after injection and we will get meds ready for your trip then as well likely

## 2018-01-25 DIAGNOSIS — M48061 Spinal stenosis, lumbar region without neurogenic claudication: Secondary | ICD-10-CM | POA: Insufficient documentation

## 2018-01-25 NOTE — Assessment & Plan Note (Signed)
Spent  25 minutes with patient face-to-face and had greater than 50% of counseling including as described in assessment and plan.  Patient has had pain.  Patient wants to avoid surgical intervention.  I do believe the patient is therapies.  Will use prednisone when needed.  Follow-up again 2-3 weeks after the injection.

## 2018-02-07 ENCOUNTER — Other Ambulatory Visit: Payer: Self-pay | Admitting: Family Medicine

## 2018-02-07 ENCOUNTER — Ambulatory Visit
Admission: RE | Admit: 2018-02-07 | Discharge: 2018-02-07 | Disposition: A | Discharge status: Home / Self Care | Payer: BLUE CROSS/BLUE SHIELD | Source: Ambulatory Visit | Attending: Family Medicine | Admitting: Family Medicine

## 2018-02-07 DIAGNOSIS — M549 Dorsalgia, unspecified: Secondary | ICD-10-CM

## 2018-02-07 MED ORDER — IOPAMIDOL (ISOVUE-M 200) INJECTION 41%
1.0000 mL | Freq: Once | INTRAMUSCULAR | Status: AC
  Administered 2018-02-07: 1 mL via EPIDURAL

## 2018-02-07 MED ORDER — METHYLPREDNISOLONE ACETATE 40 MG/ML INJ SUSP (RADIOLOG
120.0000 mg | Freq: Once | INTRAMUSCULAR | Status: AC
  Administered 2018-02-07: 120 mg via EPIDURAL

## 2018-02-07 NOTE — Discharge Instructions (Signed)

## 2018-03-10 ENCOUNTER — Encounter: Payer: Self-pay | Admitting: Family Medicine

## 2018-03-10 ENCOUNTER — Other Ambulatory Visit: Payer: Self-pay

## 2018-03-10 DIAGNOSIS — M5416 Radiculopathy, lumbar region: Secondary | ICD-10-CM

## 2018-04-05 ENCOUNTER — Ambulatory Visit
Admission: RE | Admit: 2018-04-05 | Discharge: 2018-04-05 | Disposition: A | Discharge status: Home / Self Care | Payer: BLUE CROSS/BLUE SHIELD | Source: Ambulatory Visit | Attending: Family Medicine | Admitting: Family Medicine

## 2018-04-05 DIAGNOSIS — M5416 Radiculopathy, lumbar region: Secondary | ICD-10-CM

## 2018-04-05 MED ORDER — METHYLPREDNISOLONE ACETATE 40 MG/ML INJ SUSP (RADIOLOG
120.0000 mg | Freq: Once | INTRAMUSCULAR | Status: AC
  Administered 2018-04-05: 120 mg via EPIDURAL

## 2018-04-05 MED ORDER — IOPAMIDOL (ISOVUE-M 200) INJECTION 41%
1.0000 mL | Freq: Once | INTRAMUSCULAR | Status: AC
  Administered 2018-04-05: 1 mL via EPIDURAL

## 2018-04-05 NOTE — Discharge Instructions (Signed)

## 2018-04-07 ENCOUNTER — Other Ambulatory Visit: Payer: BLUE CROSS/BLUE SHIELD

## 2018-05-02 NOTE — Progress Notes (Signed)
Chief Complaint  Patient presents with  . Rash    Rash on labia, raised bumps that are sore and itchy at times. Pt states pain and itch is mild. Pt requesting a PAP today if possible.     HPI: Natasha [name] 67 y.o. come in for concern about area on labia  Felt when washing  Seems new    Achy and itchy?  There  More than a week.   No antibiotic.    No dc    And bleeding .  No other partners   No self treatment.  No gyne abn hx of pap etc . No dc  Due for pap ?  ROS: See pertinent positives and negatives per HPI.  Past Medical History:  Diagnosis Date  . Allergic rhinitis   . Normal cardiac stress test     Family History  Problem Relation Age of Onset  . Lung disease Father   . Cancer Father        Colon  . Colon polyps Father   . Multiple sclerosis Mother   . Scleroderma Sister   . Parkinson's disease Maternal Grandmother   . Colon cancer Neg Hx     Social History   Socioeconomic History  . Marital status: Married    Spouse name: Not on file  . Number of children: Not on file  . Years of education: Not on file  . Highest education level: Not on file  Occupational History  . Not on file  Social Needs  . Financial resource strain: Not on file  . Food insecurity:    Worry: Not on file    Inability: Not on file  . Transportation needs:    Medical: Not on file    Non-medical: Not on file  Tobacco Use  . Smoking status: Never Smoker  . Smokeless tobacco: Never Used  Substance and Sexual Activity  . Alcohol use: Yes    Alcohol/week: 0.5 oz    Types: 1 Standard drinks or equivalent per week    Comment: 1 every 2 weeks   . Drug use: No  . Sexual activity: Not on file  Lifestyle  . Physical activity:    Days per week: Not on file    Minutes per session: Not on file  . Stress: Not on file  Relationships  . Social connections:    Talks on phone: Not on file    Gets together: Not on file    Attends religious service: Not on file    Active member of club  or organization: Not on file    Attends meetings of clubs or organizations: Not on file    Relationship status: Not on file  Other Topics Concern  . 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    No tob    Outpatient Medications Prior to Visit  Medication Sig Dispense Refill  . predniSONE (DELTASONE) 50 MG tablet Take 1 tablet (50 mg total) by mouth daily. (Patient not taking: Reported on 05/03/2018) 5 tablet 0  . Vitamin D, Ergocalciferol, (DRISDOL) 50000 units CAPS capsule Take 1 capsule (50,000 Units total) by mouth every 7 (seven) days. (Patient not taking: Reported on 05/03/2018) 12 capsule 0  . cyanocobalamin ((VITAMIN B-12)) injection 1,000 mcg      No facility-administered medications prior to  visit.      EXAM:  BP (!) 124/96 (BP Location: Left Arm, Patient Position: Sitting, Cuff Size: Large)   Pulse 83   Temp 97.8 F (36.6 C) (Oral)   There is no height or weight on file to calculate BMI.  GENERAL: vitals reviewed and listed above, alert, oriented, appears well hydrated and in no acute distress HEENT: atraumatic, conjunctiva  clear, no obvious abnormalities on inspection of external nose and ears PSYCH: pleasant and cooperative, no obvious depression or anxiety  Ext gu nl  But  Very small ? Papule left upper labia  ?  White top and  Another barely visible but palpable  Flatter  3 mm area superior  Perineum   No ulcer obv warty lesions  Dx clear pap done with hpv cotest   bm  No masses    Ut nt  adnexa / clear ?   BP Readings from Last 3 Encounters:  05/03/18 (!) 124/96  04/05/18 (!) 145/84  02/07/18 125/82    ASSESSMENT AND PLAN:  Discussed the following assessment and plan:  Labial perineal bumps - tiny   seems benign   will follow  can seek gyne care if progressive   or other sx .   Pap smear, as part of routine gynecological examination  - Plan: Cytology - PAP  -Patient advised to return or notify health care team  if  new concerns arise.  Patient Instructions  The bumps ;look like a plugged gland   ?  Not concerning  But if  Changing  Increasing in size   Let us check again .  Will let you know pap when    Results back .         Standley Brooking. Panosh M.D.

## 2018-05-03 ENCOUNTER — Encounter: Payer: Self-pay | Admitting: Internal Medicine

## 2018-05-03 ENCOUNTER — Ambulatory Visit: Payer: BLUE CROSS/BLUE SHIELD | Admitting: Internal Medicine

## 2018-05-03 ENCOUNTER — Other Ambulatory Visit (HOSPITAL_COMMUNITY)
Admission: RE | Admit: 2018-05-03 | Discharge: 2018-05-03 | Disposition: A | Discharge status: Home / Self Care | Payer: BLUE CROSS/BLUE SHIELD | Source: Ambulatory Visit | Attending: Internal Medicine | Admitting: Internal Medicine

## 2018-05-03 VITALS — BP 124/96 | HR 83 | Temp 97.8°F

## 2018-05-03 DIAGNOSIS — N9089 Other specified noninflammatory disorders of vulva and perineum: Secondary | ICD-10-CM | POA: Diagnosis not present

## 2018-05-03 DIAGNOSIS — Z01419 Encounter for gynecological examination (general) (routine) without abnormal findings: Secondary | ICD-10-CM | POA: Diagnosis present

## 2018-05-03 NOTE — Patient Instructions (Signed)
The bumps ;look like a plugged gland   ?  Not concerning  But if  Changing  Increasing in size   Let us check again .  Will let you know pap when    Results back .

## 2018-05-05 LAB — CYTOLOGY - PAP
Adequacy: ABSENT
Diagnosis: NEGATIVE
HPV (WINDOPATH): NOT DETECTED

## 2018-05-05 NOTE — Progress Notes (Signed)
PAP is normal. HPV high  risk is negative

## 2018-09-20 ENCOUNTER — Other Ambulatory Visit: Payer: Self-pay | Admitting: *Deleted

## 2018-09-20 DIAGNOSIS — M5416 Radiculopathy, lumbar region: Secondary | ICD-10-CM

## 2018-10-03 ENCOUNTER — Encounter: Payer: Self-pay | Admitting: Internal Medicine

## 2018-10-03 ENCOUNTER — Ambulatory Visit (INDEPENDENT_AMBULATORY_CARE_PROVIDER_SITE_OTHER): Payer: Medicare Other | Admitting: Internal Medicine

## 2018-10-03 VITALS — BP 124/82 | HR 71 | Temp 97.5°F

## 2018-10-03 DIAGNOSIS — Z298 Encounter for other specified prophylactic measures: Secondary | ICD-10-CM

## 2018-10-03 DIAGNOSIS — Z7184 Encounter for health counseling related to travel: Secondary | ICD-10-CM

## 2018-10-03 DIAGNOSIS — Z23 Encounter for immunization: Secondary | ICD-10-CM | POA: Diagnosis not present

## 2018-10-03 MED ORDER — DOXYCYCLINE HYCLATE 100 MG PO TABS: ORAL_TABLET | ORAL | 0 refills | Status: DC

## 2018-10-03 NOTE — Patient Instructions (Addendum)
Take  Doxycycline  every day   And    No missing  .   Inset bite precautions .   Hep a td and prevnar 13 today .   Get second Hep A in 6 months   Have a good trip!

## 2018-10-03 NOTE — Progress Notes (Signed)
Chief Complaint  Patient presents with  . Follow-up    Pt is going to be traveling in December and is needing vaccines updated and preventative medications, traveling to [place]    HPI: Natasha [name] 67 y.o. come in for travel  To [place]    For 2.5 weeks and wants doxy for  malaria prophylasix   Updated  Vaccines  .  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  To have injection  Before trip .  ROS: See pertinent positives and negatives per HPI.  Past Medical History:  Diagnosis Date  . Allergic rhinitis   . Normal cardiac stress test     Family History  Problem Relation Age of Onset  . Lung disease Father   . Cancer Father        Colon  . Colon polyps Father   . Multiple sclerosis Mother   . Scleroderma Sister   . Parkinson's disease Maternal Grandmother   . Colon cancer Neg Hx     Social History   Socioeconomic History  . Marital status: Married    Spouse name: Not on file  . Number of children: Not on file  . Years of education: Not on file  . Highest education level: Not on file  Occupational History  . Not on file  Social Needs  . Financial resource strain: Not on file  . Food insecurity:    Worry: Not on file    Inability: Not on file  . Transportation needs:    Medical: Not on file    Non-medical: Not on file  Tobacco Use  . Smoking status: Never Smoker  . Smokeless tobacco: Never Used  Substance and Sexual Activity  . Alcohol use: Yes    Alcohol/week: 1.0 standard drinks    Types: 1 Standard drinks or equivalent per week    Comment: 1 every 2 weeks   . Drug use: No  . Sexual activity: Not on file  Lifestyle  . Physical activity:    Days per week: Not on file    Minutes per session: Not on file  . Stress: Not on file  Relationships  . Social connections:    Talks on phone: Not on file    Gets together: Not on file    Attends religious service: Not on file    Active member of club or organization: Not on file    Attends meetings of clubs or  organizations: Not on file    Relationship status: Not on file  Other Topics Concern  . 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    No tob    No outpatient medications prior to visit.   No facility-administered medications prior to visit.      EXAM:  BP 124/82 (BP Location: Right Arm, Patient Position: Sitting, Cuff Size: Large)   Pulse 71   Temp (!) 97.5 F (36.4 C) (Oral)   There is no height or weight on file to calculate BMI.  GENERAL: vitals reviewed and listed above, alert, oriented, appears well hydrated and in no acute distress HEENT: atraumatic, conjunctiva  clear, no obvious abnormalities on inspection of external nose and ears PSYCH: pleasant and cooperative, no obvious depression or anxiety  BP Readings from Last 3 Encounters:  10/03/18 124/82  05/03/18 (!) 124/96  04/05/18 Marland Kitchen)  145/84    ASSESSMENT AND PLAN:  Discussed the following assessment and plan:  Counseling about travel  Need for malaria prophylaxis  Need for Td vaccine - Plan: Td : Tetanus/diphtheria >7yo Preservative  free  Need for pneumococcal vaccination - Plan: Pneumococcal conjugate vaccine 13-valent IM  Need for hepatitis A immunization - Plan: Hepatitis A vaccine adult IM Counseled.   And pna vaccine  Guidelines   Total visit 53mins > 50% spent counseling and coordinating care as indicated in above note and in instructions to patient .  -Patient advised to return or notify health care team  if  new concerns arise.  Patient Instructions  Take  Doxycycline  every day   And    No missing  .   Inset bite precautions .   Hep a td and prevnar 13 today .   Get second Hep A in 6 months   Have a good trip!      Standley Brooking. Johathan Province M.D.

## 2018-11-07 ENCOUNTER — Ambulatory Visit (INDEPENDENT_AMBULATORY_CARE_PROVIDER_SITE_OTHER): Payer: Medicare Other | Admitting: Sports Medicine

## 2018-11-07 ENCOUNTER — Ambulatory Visit (INDEPENDENT_AMBULATORY_CARE_PROVIDER_SITE_OTHER): Payer: Medicare Other

## 2018-11-07 ENCOUNTER — Ambulatory Visit: Payer: Self-pay

## 2018-11-07 ENCOUNTER — Encounter: Payer: Self-pay | Admitting: Sports Medicine

## 2018-11-07 VITALS — BP 124/80 | HR 78 | Ht 66.0 in

## 2018-11-07 DIAGNOSIS — M25562 Pain in left knee: Secondary | ICD-10-CM

## 2018-11-07 DIAGNOSIS — M1712 Unilateral primary osteoarthritis, left knee: Secondary | ICD-10-CM | POA: Diagnosis not present

## 2018-11-07 DIAGNOSIS — M170 Bilateral primary osteoarthritis of knee: Secondary | ICD-10-CM | POA: Diagnosis not present

## 2018-11-07 NOTE — Procedures (Signed)
PROCEDURE NOTE:  Ultrasound Guided: Injection: Left knee, knee parameniscal cyst Images were obtained and interpreted by myself, Teresa Coombs, DO  Images have been saved and stored to PACS system. Images obtained on: GE S7 Ultrasound machine    ULTRASOUND FINDINGS:  No significant effusion Large medial osteophyte with parameniscal cyst and fluid tracking distally to the medial capsule   DESCRIPTION OF PROCEDURE:  The patient's clinical condition is marked by substantial pain and/or significant functional disability. Other conservative therapy has not provided relief, is contraindicated, or not appropriate. There is a reasonable likelihood that injection will significantly improve the patient's pain and/or functional impairment.   After discussing the risks, benefits and expected outcomes of the injection and all questions were reviewed and answered, the patient wished to undergo the above named procedure.  Verbal consent was obtained.  The ultrasound was used to identify the target structure and adjacent neurovascular structures. The skin was then prepped in sterile fashion and the target structure was injected under direct visualization using sterile technique as below:  Single injection performed as below: PREP: Alcohol and Ethel Chloride APPROACH:medial direct, single injection, 25g 1.5 in. INJECTATE: 2 cc 0.5% Marcaine and 2 cc 40mg /mL DepoMedrol ASPIRATE: None DRESSING: Band-Aid  Post procedural instructions including recommending icing and warning signs for infection were reviewed.    This procedure was well tolerated and there were no complications.   IMPRESSION: Succesful Ultrasound Guided: Injection

## 2018-11-07 NOTE — Patient Instructions (Addendum)

## 2018-11-08 ENCOUNTER — Ambulatory Visit: Payer: Medicare Other | Admitting: Family Medicine

## 2018-11-10 ENCOUNTER — Ambulatory Visit
Admission: RE | Admit: 2018-11-10 | Discharge: 2018-11-10 | Disposition: A | Discharge status: Home / Self Care | Payer: Medicare Other | Source: Ambulatory Visit | Attending: Family Medicine | Admitting: Family Medicine

## 2018-11-10 DIAGNOSIS — M5416 Radiculopathy, lumbar region: Secondary | ICD-10-CM

## 2018-11-10 DIAGNOSIS — M47817 Spondylosis without myelopathy or radiculopathy, lumbosacral region: Secondary | ICD-10-CM | POA: Diagnosis not present

## 2018-11-10 MED ORDER — METHYLPREDNISOLONE ACETATE 40 MG/ML INJ SUSP (RADIOLOG
120.0000 mg | Freq: Once | INTRAMUSCULAR | Status: AC
  Administered 2018-11-10: 120 mg via EPIDURAL

## 2018-11-10 MED ORDER — IOPAMIDOL (ISOVUE-M 200) INJECTION 41%
1.0000 mL | Freq: Once | INTRAMUSCULAR | Status: AC
  Administered 2018-11-10: 1 mL via EPIDURAL

## 2018-11-10 NOTE — Discharge Instructions (Signed)

## 2018-11-15 NOTE — Progress Notes (Signed)
[name] [address] [place], [place] [address] Phone: [phone] Subjective:    I, Wendy Poet PT, LAT, ATC, am serving as scribe for Hulan Saas, DO   CC: Back pain follow-up  [id number]  Natasha [name] is a 67 y.o. female coming in with complaint of back pain.  Was having right-sided lumbar radiculopathy.  Worsening symptoms and failed conservative therapy.  MRI was ordered that showed severe spinal stenosis at L3-L4.  Epidural was ordered and given.  Patient had this done on November 10, 2018.  She states that her back is feeling much improved since the epidural, feeling approximately 92% improved.  She reports that her L knee feels about 80% improved from her injection last Tuesday.    Past Medical History:  Diagnosis Date  . Allergic rhinitis   . Normal cardiac stress test    Past Surgical History:  Procedure Laterality Date  . MYRINGOTOMY     right 7 th grade   Social History   Socioeconomic History  . Marital status: Married    Spouse name: Not on file  . Number of children: Not on file  . Years of education: Not on file  . Highest education level: Not on file  Occupational History  . Not on file  Social Needs  . Financial resource strain: Not on file  . Food insecurity:    Worry: Not on file    Inability: Not on file  . Transportation needs:    Medical: Not on file    Non-medical: Not on file  Tobacco Use  . Smoking status: Never Smoker  . Smokeless tobacco: Never Used  Substance and Sexual Activity  . Alcohol use: Yes    Alcohol/week: 1.0 standard drinks    Types: 1 Standard drinks or equivalent per week    Comment: 1 every 2 weeks   . Drug use: No  . Sexual activity: Not on file  Lifestyle  . Physical activity:    Days per week: Not on file    Minutes per session: Not on file  . Stress: Not on file  Relationships  . Social connections:    Talks on phone: Not on file    Gets together: Not on file   Attends religious service: Not on file    Active member of club or organization: Not on file    Attends meetings of clubs or organizations: Not on file    Relationship status: Not on file  Other Topics Concern  . 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    No tob   No Known Allergies Family History  Problem Relation Age of Onset  . Lung disease Father   . Cancer Father        Colon  . Colon polyps Father   . Multiple sclerosis Mother   . Scleroderma Sister   . Parkinson's disease Maternal Grandmother   . Colon cancer Neg Hx     Current Outpatient Medications (Endocrine & Metabolic):  .  predniSONE (DELTASONE) 50 MG tablet, Take 1 tablet (50 mg total) by mouth daily.      Current Outpatient Medications (Other):  .  doxycycline (VIBRA-TABS) 100 MG tablet, 1 po qd beginning 1-2 days pre exposure and continue  Days after for malaria prophylaxis  Past medical history, social, surgical and family history all reviewed in electronic medical record.  No pertanent information unless stated regarding to the chief complaint.   Review of Systems:  No headache, visual changes, nausea, vomiting, diarrhea, constipation, dizziness, abdominal pain, skin rash, fevers, chills, night sweats, weight loss, swollen lymph nodes, body aches, joint swelling,  chest pain, shortness of breath, mood changes.  Mild positive muscle aches  Objective  Blood pressure 124/80, pulse 69, height 5\' 6"  (1.676 m), SpO2 94 %.   General: No apparent distress alert and oriented x3 mood and affect normal, dressed appropriately.  HEENT: Pupils equal, extraocular movements intact  Respiratory: Patient's speak in full sentences and does not appear short of breath  Cardiovascular: No lower extremity edema, non tender, no erythema  Skin: Warm dry intact with no signs of infection or rash on  extremities or on axial skeleton.  Abdomen: Soft nontender  Neuro: Cranial nerves II through XII are intact, neurovascularly intact in all extremities with 2+ DTRs and 2+ pulses.  Lymph: No lymphadenopathy of posterior or anterior cervical chain or axillae bilaterally.  Gait normal with good balance and coordination.  MSK:  Non tender with full range of motion and good stability and symmetric strength and tone of shoulders, elbows, wrist, hip, and ankles bilaterally.  Left knee does have some bruising over the peds anserine area.  Mild tenderness in that area.  Mild to moderate degenerative changes of the knees bilaterally left greater than right.  No significant instability noted of the knees.  Back exam has some mild loss of lordosis.  Patient does have mild positive straight leg test on the right.  Mild positive Corky Sox test.  Lacks last 5 degrees of extension in the last 5 degrees of sidebending bilaterally   Impression and Recommendations:     This case required medical decision making of moderate complexity. The above documentation has been reviewed and is accurate and complete Lyndal Pulley, DO       Note: This dictation was prepared with Dragon dictation along with smaller phrase technology. Any transcriptional errors that result from this process are unintentional.

## 2018-11-16 ENCOUNTER — Encounter: Payer: Self-pay | Admitting: Family Medicine

## 2018-11-16 ENCOUNTER — Ambulatory Visit (INDEPENDENT_AMBULATORY_CARE_PROVIDER_SITE_OTHER): Payer: Medicare Other | Admitting: Family Medicine

## 2018-11-16 DIAGNOSIS — M170 Bilateral primary osteoarthritis of knee: Secondary | ICD-10-CM

## 2018-11-16 DIAGNOSIS — M48062 Spinal stenosis, lumbar region with neurogenic claudication: Secondary | ICD-10-CM

## 2018-11-16 MED ORDER — PREDNISONE 50 MG PO TABS: 50.0000 mg | ORAL_TABLET | Freq: Every day | ORAL | 0 refills | Status: DC

## 2018-11-16 NOTE — Assessment & Plan Note (Signed)
Improved after epidural.  No significant changes in management.  Follow-up as needed.

## 2018-11-16 NOTE — Assessment & Plan Note (Signed)
Stable.  Discussed which activities to do which was to avoid.  If any worsening symptoms, consider injections.

## 2018-11-16 NOTE — Patient Instructions (Addendum)
Good to see you  Prednisone daily if you need it Continue everything else Stay active Proud of you  See me again in 4 weeks

## 2019-01-03 ENCOUNTER — Other Ambulatory Visit: Payer: Self-pay | Admitting: Internal Medicine

## 2019-01-03 DIAGNOSIS — Z1231 Encounter for screening mammogram for malignant neoplasm of breast: Secondary | ICD-10-CM

## 2019-01-03 NOTE — Progress Notes (Signed)
Chief Complaint  Patient presents with  . Follow-up    Pt wants to get b12 tested because she has been getting angry and in past was caused by b12 pt states it makes her "cranky":    HPI: Natasha [name] 68 y.o. come in for [facility] about low B12 level which she had last year treated with B12 injections.  Apparently did not change to over-the-counter oral B12 but felt better. Last month she got back from her 3-week trip to [place] and had a good experience. However she is noticed she is short tempered and achy somewhat irritable despite the fact that she is getting enough sleep swimming exercise 3 times a week although sciatica has been problematic.  Asked about taking Tylenol which does help. No numbness or weakness otherwise. she says she is always been a bit high strung but her irritability is worse recently.  Problem has been an issue since the fall.  Activity 3 x per week.  Swimming and "feels like a mermaid" Has been able to lose 20 pounds recently.  Did not weigh today.  In the office.  Jan 5 trip.  ROS: See pertinent positives and negatives per HPI.  Past Medical History:  Diagnosis Date  . Allergic rhinitis   . Normal cardiac stress test     Family History  Problem Relation Age of Onset  . Lung disease Father   . Cancer Father        Colon  . Colon polyps Father   . Multiple sclerosis Mother   . Scleroderma Sister   . Parkinson's disease Maternal Grandmother   . Colon cancer Neg Hx     Social History   Socioeconomic History  . Marital status: Married    Spouse name: Not on file  . Number of children: Not on file  . Years of education: Not on file  . Highest education level: Not on file  Occupational History  . Not on file  Social Needs  . Financial resource strain: Not on file  . Food insecurity:    Worry: Not on file    Inability: Not on file  . Transportation needs:    Medical: Not on file    Non-medical: Not on file  Tobacco Use  . Smoking  status: Never Smoker  . Smokeless tobacco: Never Used  Substance and Sexual Activity  . Alcohol use: Yes    Alcohol/week: 1.0 standard drinks    Types: 1 Standard drinks or equivalent per week    Comment: 1 every 2 weeks   . Drug use: No  . Sexual activity: Not on file  Lifestyle  . Physical activity:    Days per week: Not on file    Minutes per session: Not on file  . Stress: Not on file  Relationships  . Social connections:    Talks on phone: Not on file    Gets together: Not on file    Attends religious service: Not on file    Active member of club or organization: Not on file    Attends meetings of clubs or organizations: Not on file    Relationship status: Not on file  Other Topics Concern  . 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    No tob    Outpatient Medications Prior to  Visit  Medication Sig Dispense Refill  . doxycycline (VIBRA-TABS) 100 MG tablet 1 po qd beginning 1-2 days pre exposure and continue  Days after for malaria prophylaxis (Patient not taking: Reported on 01/04/2019) 42 tablet 0  . predniSONE (DELTASONE) 50 MG tablet Take 1 tablet (50 mg total) by mouth daily. (Patient not taking: Reported on 01/04/2019) 5 tablet 0   No facility-administered medications prior to visit.      EXAM:  BP 128/76 (BP Location: Left Arm, Patient Position: Sitting, Cuff Size: Normal)   Pulse 65   Temp (!) 97.4 F (36.3 C) (Oral)   SpO2 96%   There is no height or weight on file to calculate BMI.  GENERAL: vitals reviewed and listed above, alert, oriented, appears well hydrated and in no acute distress HEENT: atraumatic, conjunctiva  clear, no obvious abnormalities on inspection of external nose and ears  NECK: no obvious masses on inspection palpation  LUNGS: clear to auscultation bilaterally, no wheezes, rales or rhonchi, good air  movement CV: HRRR, no clubbing cyanosis or  peripheral edema nl cap refill  Abdomen soft without organomegaly guarding or rebound. MS: moves all extremities without noticeable focal  abnormality PSYCH: pleasant and cooperative, no obvious depression or anxiety Lab Results  Component Value Date   WBC 5.9 01/04/2019   HGB 14.5 01/04/2019   HCT 42.6 01/04/2019   PLT 244.0 01/04/2019   GLUCOSE 88 01/04/2019   CHOL 151 01/04/2019   TRIG 158.0 (H) 01/04/2019   HDL 26.60 (L) 01/04/2019   LDLCALC 93 01/04/2019   ALT 11 01/04/2019   AST 10 01/04/2019   NA 140 01/04/2019   K 3.9 01/04/2019   CL 105 01/04/2019   CREATININE 0.81 01/04/2019   BUN 13 01/04/2019   CO2 28 01/04/2019   TSH 1.77 01/04/2019   HGBA1C 5.0 08/18/2016   BP Readings from Last 3 Encounters:  01/04/19 128/76  11/16/18 124/80  11/10/18 (!) 168/81   Wt Readings from Last 3 Encounters:  05/03/17 250 lb (113.4 kg)  07/29/16 230 lb 6.4 oz (104.5 kg)  09/25/14 238 lb (108 kg)    ASSESSMENT AND PLAN:  Discussed the following assessment and plan:  Vitamin B 12 deficiency - Plan: CBC with Differential/Platelet, CMP, Vitamin B12, Lipid panel, TSH  Low HDL (under 40) - Plan: CBC with Differential/Platelet, CMP, Vitamin B12, Lipid panel, TSH  Irritability - Plan: CBC with Differential/Platelet, CMP, Vitamin B12, Lipid panel, TSH Lab today nonfasting had a children's portion half bagel and egg. Check B16 other metabolic. Is possible the pain is causing some of her irritability discussed anxiety depression other factors.  No menopausal symptoms. Continue healthy weight loss okay to take Tylenol once or twice a day if it is helpful for her pain. -Patient advised to return or notify health care team  if  new concerns arise.  Patient Instructions  Checking levels  Let you know results .   Keep up healthy eating.   Natasha [name]. Natasha [name] M.D.

## 2019-01-04 ENCOUNTER — Encounter: Payer: Self-pay | Admitting: Internal Medicine

## 2019-01-04 ENCOUNTER — Ambulatory Visit (INDEPENDENT_AMBULATORY_CARE_PROVIDER_SITE_OTHER): Payer: Medicare Other | Admitting: Internal Medicine

## 2019-01-04 VITALS — BP 128/76 | HR 65 | Temp 97.4°F

## 2019-01-04 DIAGNOSIS — R454 Irritability and anger: Secondary | ICD-10-CM | POA: Diagnosis not present

## 2019-01-04 DIAGNOSIS — E538 Deficiency of other specified B group vitamins: Secondary | ICD-10-CM | POA: Diagnosis not present

## 2019-01-04 DIAGNOSIS — E786 Lipoprotein deficiency: Secondary | ICD-10-CM | POA: Diagnosis not present

## 2019-01-04 LAB — VITAMIN B12: VITAMIN B 12: 400 pg/mL (ref 211–911)

## 2019-01-04 LAB — COMPREHENSIVE METABOLIC PANEL
ALBUMIN: 4.3 g/dL (ref 3.5–5.2)
ALK PHOS: 90 U/L (ref 39–117)
ALT: 11 U/L (ref 0–35)
AST: 10 U/L (ref 0–37)
BILIRUBIN TOTAL: 0.7 mg/dL (ref 0.2–1.2)
BUN: 13 mg/dL (ref 6–23)
CALCIUM: 9.8 mg/dL (ref 8.4–10.5)
CO2: 28 mEq/L (ref 19–32)
CREATININE: 0.81 mg/dL (ref 0.40–1.20)
Chloride: 105 mEq/L (ref 96–112)
GFR: 70.89 mL/min (ref >60.00)
Glucose, Bld: 88 mg/dL (ref 70–99)
Potassium: 3.9 mEq/L (ref 3.5–5.1)
Sodium: 140 mEq/L (ref 135–145)
TOTAL PROTEIN: 6.5 g/dL (ref 6.0–8.3)

## 2019-01-04 LAB — CBC WITH DIFFERENTIAL/PLATELET
BASOS ABS: 0.1 10*3/uL (ref 0.0–0.1)
Basophils Relative: 1.2 % (ref 0.0–3.0)
Eosinophils Absolute: 0.5 10*3/uL (ref 0.0–0.7)
Eosinophils Relative: 8.9 % — ABNORMAL HIGH (ref 0.0–5.0)
HEMATOCRIT: 42.6 % (ref 36.0–46.0)
HEMOGLOBIN: 14.5 g/dL (ref 12.0–15.0)
LYMPHS PCT: 15.6 % (ref 12.0–46.0)
Lymphs Abs: 0.9 10*3/uL (ref 0.7–4.0)
MCHC: 34.1 g/dL (ref 30.0–36.0)
MCV: 95.2 fl (ref 78.0–100.0)
MONOS PCT: 7.6 % (ref 3.0–12.0)
Monocytes Absolute: 0.4 10*3/uL (ref 0.1–1.0)
NEUTROS ABS: 3.9 10*3/uL (ref 1.4–7.7)
Neutrophils Relative %: 66.7 % (ref 43.0–77.0)
PLATELETS: 244 10*3/uL (ref 150.0–400.0)
RBC: 4.48 Mil/uL (ref 3.87–5.11)
RDW: 13.1 % (ref 11.5–15.5)
WBC: 5.9 10*3/uL (ref 4.0–10.5)

## 2019-01-04 LAB — LIPID PANEL
CHOLESTEROL: 151 mg/dL (ref 0–200)
HDL: 26.6 mg/dL — ABNORMAL LOW (ref >39.00)
LDL Cholesterol: 93 mg/dL (ref 0–99)
NonHDL: 124.71
TRIGLYCERIDES: 158 mg/dL — AB (ref 0.0–149.0)
Total CHOL/HDL Ratio: 6
VLDL: 31.6 mg/dL (ref 0.0–40.0)

## 2019-01-04 LAB — TSH: TSH: 1.77 u[IU]/mL (ref 0.35–4.50)

## 2019-01-04 NOTE — Patient Instructions (Signed)
Checking levels  Let you know results .   Keep up healthy eating.

## 2019-01-09 ENCOUNTER — Encounter: Payer: Self-pay | Admitting: Sports Medicine

## 2019-01-09 NOTE — Progress Notes (Signed)
Natasha [name]. Natasha [name], [facility] [place]  Natasha [name] - 68 y.o. female MRN [id number]  Date of birth: November 28, 1951  Visit Date: 11/07/2018  PCP: Burnis Medin, MD   Referred by: Burnis Medin, MD  SUBJECTIVE:   Chief Complaint  Patient presents with  . Initial Assessment    L knee pain - [facility] pt    HPI: Patient presents with worsening left knee pain that is medially located.  There is appreciable clicking and popping.  She has been seen by Dr. Tamala Julian in the past for this but is been quite some time.  She denies any known eliciting injury pain seems localized to the left medial knee.  REVIEW OF SYSTEMS: Per HPI  HISTORY:  Prior history reviewed and updated per electronic medical record.  Patient Active Problem List   Diagnosis Date Noted  . Primary osteoarthritis of both knees 11/07/2018  . Spinal stenosis of lumbar region 01/25/2018  . Right lumbar radiculopathy 08/10/2017  . Vitamin B 12 deficiency 09/06/2016    mma elevated  b12 low normal  No amemia  Neg ifa   Plan parenteral b12 for now and oral and then fu  3 months    . Routine general medical examination at a health care facility 10/02/2012  . Routine gynecological examination 10/02/2012  . Family history of colon cancer 10/02/2012  . HYPERLIPIDEMIA 08/06/2008    Qualifier: Diagnosis of  By: Regis Bill MD, [place] OBESITY 08/06/2008    Qualifier: Diagnosis of  By: Regis Bill MD, [place] Brooking    . ALLERGIC RHINITIS 08/06/2008    Qualifier: Diagnosis of  By: Regis Bill MD, [place] Brooking    . LOW HDL 04/11/2008    Qualifier: Diagnosis of  By: Hulan Saas, CMA (AAMA), [place]    . MITRAL VALVE PROLAPSE 04/11/2008    Qualifier: Diagnosis of  By: Hulan Saas, CMA (AAMA), [place] Skye     Social History   Occupational History  . Not on file  Tobacco Use  . Smoking status: Never Smoker  . Smokeless tobacco: Never Used  Substance and Sexual Activity  . Alcohol  use: Yes    Alcohol/week: 1.0 standard drinks    Types: 1 Standard drinks or equivalent per week    Comment: 1 every 2 weeks   . Drug use: No  . 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    No tob    OBJECTIVE:  VS:  HT:5\' 6"  (167.6 cm)   WT:(pt refused)  BMI:     BP:124/80  HR:78bpm  TEMP: ( )  RESP:94 %   PHYSICAL EXAM: Left knee is overall well aligned.  No significant effusion.  She has pain with McMurray's without appreciable click. Pain is focally over the medial joint line.     ASSESSMENT:   1. Primary osteoarthritis of both knees   2. Acute pain of left knee     PROCEDURES:  US Guided Injection per procedure note   PLAN:  Pertinent additional documentation may be included in corresponding procedure notes, imaging studies, problem based documentation and patient instructions.  No problem-specific Assessment & Plan notes found for this encounter.   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  Continue previously prescribed home exercise program.   Activity modifications and the importance of avoiding exacerbating activities (limiting pain to no more than a 4 / 10 during or following activity) recommended and discussed.  Discussed red flag symptoms that warrant earlier emergent evaluation and patient voices understanding.  At follow up will plan to consider: repeat corticosteroid injections  Return if symptoms worsen or fail to improve.          Gerda Diss, [facility] Physician

## 2019-01-13 NOTE — Progress Notes (Signed)
[name] [address], [place] [address] Phone: [phone] Subjective:    I'm seeing this patient by the request  of:    CC: Back and knee pain  FTD:[id number]   11/16/2018:  Improved after epidural.  No significant changes in management.  Follow-up as needed.  Stable.  Discussed which activities to do which was to avoid.  If any worsening symptoms, consider injections.  Update 01/15/2019:   Natasha [name] is a 68 y.o. female coming in with complaint of back and knee pain. She had no pain during her trip to [place]. She did have 2 incidences in which her pain increased to 8/10 during her trip. Had done a lot of walking during those increases in her pain. Used ice, NSAID and steroid. Her knee has been fine since getting back to the [place]. She is swimming 3x a week. Does continue to have sciatic nerve on the right side.    Patient has history of spinal stenosis.  Was sent for an epidural in November 16, 2018.  Patient was improved on the next follow-up.  Patient was having worsening pain again.  Now seems to be more on the lateral aspect of the calf.  Seems to be giving her more of a discomfort than it has been previously.  Patient is concerned about the long-term nature of this. \ Did previously have an MRI.  MRI did show spinal stenosis at L3-L5 and with that he had a left lateral recess causing a L5 nerve root impingement.  Past Medical History:  Diagnosis Date  . Allergic rhinitis   . Normal cardiac stress test    Past Surgical History:  Procedure Laterality Date  . MYRINGOTOMY     right 7 th grade   Social History   Socioeconomic History  . Marital status: Married    Spouse name: Not on file  . Number of children: Not on file  . Years of education: Not on file  . Highest education level: Not on file  Occupational History  . Not on file  Social Needs  . Financial resource strain: Not on file  . Food insecurity:    Worry: Not on  file    Inability: Not on file  . Transportation needs:    Medical: Not on file    Non-medical: Not on file  Tobacco Use  . Smoking status: Never Smoker  . Smokeless tobacco: Never Used  Substance and Sexual Activity  . Alcohol use: Yes    Alcohol/week: 1.0 standard drinks    Types: 1 Standard drinks or equivalent per week    Comment: 1 every 2 weeks   . Drug use: No  . Sexual activity: Not on file  Lifestyle  . Physical activity:    Days per week: Not on file    Minutes per session: Not on file  . Stress: Not on file  Relationships  . Social connections:    Talks on phone: Not on file    Gets together: Not on file    Attends religious service: Not on file    Active member of club or organization: Not on file    Attends meetings of clubs or organizations: Not on file    Relationship status: Not on file  Other Topics Concern  . 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    No tob   No Known Allergies Family History  Problem Relation Age of Onset  . Lung disease Father   . Cancer Father        Colon  . Colon polyps Father   . Multiple sclerosis Mother   . Scleroderma Sister   . Parkinson's disease Maternal Grandmother   . Colon cancer Neg Hx    No current outpatient medications on file.    Past medical history, social, surgical and family history all reviewed in electronic medical record.  No pertanent information unless stated regarding to the chief complaint.   Review of Systems:  No headache, visual changes, nausea, vomiting, diarrhea, constipation, dizziness, abdominal pain, skin rash, fevers, chills, night sweats, weight loss, swollen lymph nodes, body aches, joint swelling,  chest pain, shortness of breath, mood changes.  Positive muscle aches  Objective  Blood pressure 100/70, pulse 83, height 5\' 6"  (1.676 m), SpO2 99 %.   General:  No apparent distress alert and oriented x3 mood and affect normal, dressed appropriately.  HEENT: Pupils equal, extraocular movements intact  Respiratory: Patient's speak in full sentences and does not appear short of breath  Cardiovascular: No lower extremity edema, non tender, no erythema  Skin: Warm dry intact with no signs of infection or rash on extremities or on axial skeleton.  Abdomen: Soft nontender  Neuro: Cranial nerves II through XII are intact, neurovascularly intact in all extremities with 2+ DTRs and 2+ pulses.  Lymph: No lymphadenopathy of posterior or anterior cervical chain or axillae bilaterally.  Gait mild antalgic MSK:  Non tender with full range of motion and good stability and symmetric strength and tone of shoulders, elbows, wrist, hip and ankles bilaterally.  Knee: Bilateral valgus deformity noted.  Abnormal thigh to calf ratio.  Tenderness on exam ROM full in flexion and extension and lower leg rotation. instability with valgus force.  painful patellar compression. Patellar glide with moderate crepitus. Patellar and quadriceps tendons unremarkable. Hamstring and quadriceps strength is normal. Contralateral knee shows  Back Exam:  Inspection: Loss of lordosis with some mild degenerative scoliosis Motion: Flexion 40 deg, Extension 25 deg, Side Bending to 45 deg bilaterally,  Rotation to 25 deg bilaterally  SLR laying: Positive on the right XSLR laying: Negative  Palpable tenderness: To palpation paraspinal musculature lumbar spine left greater than right. FABER: Significant tightness bilaterally. Sensory change: Gross sensation intact to all lumbar and sacral dermatomes.  Reflexes: 2+ at both patellar tendons, 2+ at achilles tendons, Babinski's downgoing.  Strength at foot  4-5 on the right side compared to contralateral side Impression and Recommendations:     This case required medical decision making of moderate complexity. The above documentation has  been reviewed and is accurate and complete Lyndal Pulley, DO       Note: This dictation was prepared with Dragon dictation along with smaller phrase technology. Any transcriptional errors that result from this process are unintentional.

## 2019-01-15 ENCOUNTER — Ambulatory Visit (INDEPENDENT_AMBULATORY_CARE_PROVIDER_SITE_OTHER): Payer: Medicare Other | Admitting: Family Medicine

## 2019-01-15 ENCOUNTER — Encounter: Payer: Self-pay | Admitting: Family Medicine

## 2019-01-15 VITALS — BP 100/70 | HR 83 | Ht 66.0 in

## 2019-01-15 DIAGNOSIS — M5416 Radiculopathy, lumbar region: Secondary | ICD-10-CM

## 2019-01-15 NOTE — Patient Instructions (Signed)
Good ot see you   Call [phone] to try a S1 nerve root block on the right side This will tell us how much the nerve is involved and see if radio frequency ablation is worth it Continue everything else See me again 3 weeks AFTER the injection In the meantime read about Dr. Maryjean Ka as well

## 2019-01-15 NOTE — Assessment & Plan Note (Signed)
Patient is having worsening symptoms again.  I am concerned for more of the S1 nerve root at this moment.  We discussed with patient in great length about icing regimen, home exercise, which activities to do which wants to avoid.  Patient is to increase activity slowly over the course the next several weeks.  We discussed icing regimen and home exercises.  Patient is to slowly try these as well as being set up for a S1 nerve root block.  Patient then could be potentially a candidate for radiofrequency ablation.  Encouraged weight loss.  Follow-up 3 weeks after the injections.  Spent  25 minutes with patient face-to-face and had greater than 50% of counseling including as described above in assessment and plan.

## 2019-01-18 DIAGNOSIS — H43393 Other vitreous opacities, bilateral: Secondary | ICD-10-CM | POA: Diagnosis not present

## 2019-01-24 ENCOUNTER — Other Ambulatory Visit: Payer: Medicare Other

## 2019-02-01 ENCOUNTER — Ambulatory Visit
Admission: RE | Admit: 2019-02-01 | Discharge: 2019-02-01 | Disposition: A | Discharge status: Home / Self Care | Payer: Medicare Other | Source: Ambulatory Visit | Attending: Internal Medicine | Admitting: Internal Medicine

## 2019-02-01 DIAGNOSIS — Z1231 Encounter for screening mammogram for malignant neoplasm of breast: Secondary | ICD-10-CM

## 2019-08-10 DIAGNOSIS — Z23 Encounter for immunization: Secondary | ICD-10-CM | POA: Diagnosis not present

## 2019-09-19 ENCOUNTER — Encounter: Payer: Self-pay | Admitting: Gastroenterology

## 2019-09-19 DIAGNOSIS — Z23 Encounter for immunization: Secondary | ICD-10-CM | POA: Diagnosis not present

## 2019-09-19 DIAGNOSIS — D225 Melanocytic nevi of trunk: Secondary | ICD-10-CM | POA: Diagnosis not present

## 2019-09-19 DIAGNOSIS — I781 Nevus, non-neoplastic: Secondary | ICD-10-CM | POA: Diagnosis not present

## 2019-09-19 DIAGNOSIS — L814 Other melanin hyperpigmentation: Secondary | ICD-10-CM | POA: Diagnosis not present

## 2019-09-19 DIAGNOSIS — L821 Other seborrheic keratosis: Secondary | ICD-10-CM | POA: Diagnosis not present

## 2020-02-14 IMAGING — DX DG KNEE AP/LAT W/ SUNRISE*L*
3 series · 3 of 3 positions shown · non-contrast
Comparison: None.

CLINICAL DATA: Left knee pain for several days without injury.

EXAM:
LEFT KNEE 3 VIEWS

[knee standing ap]
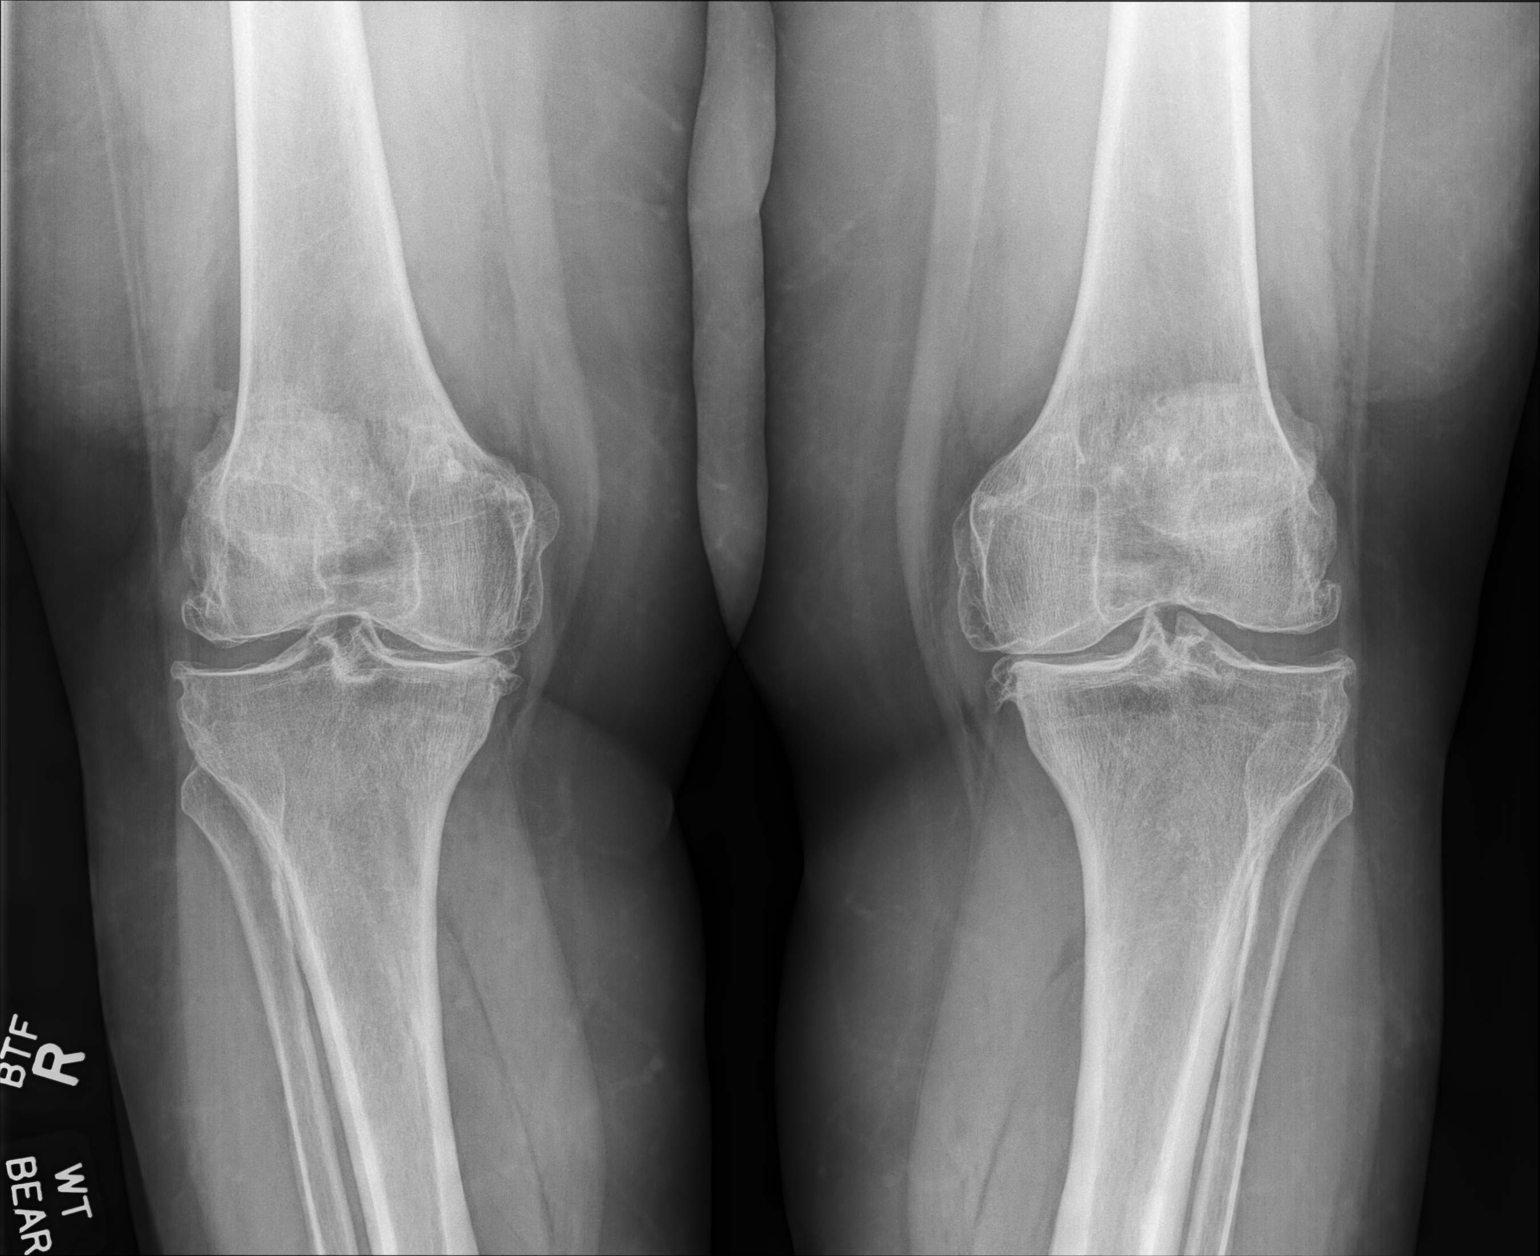

[knee standing lat]
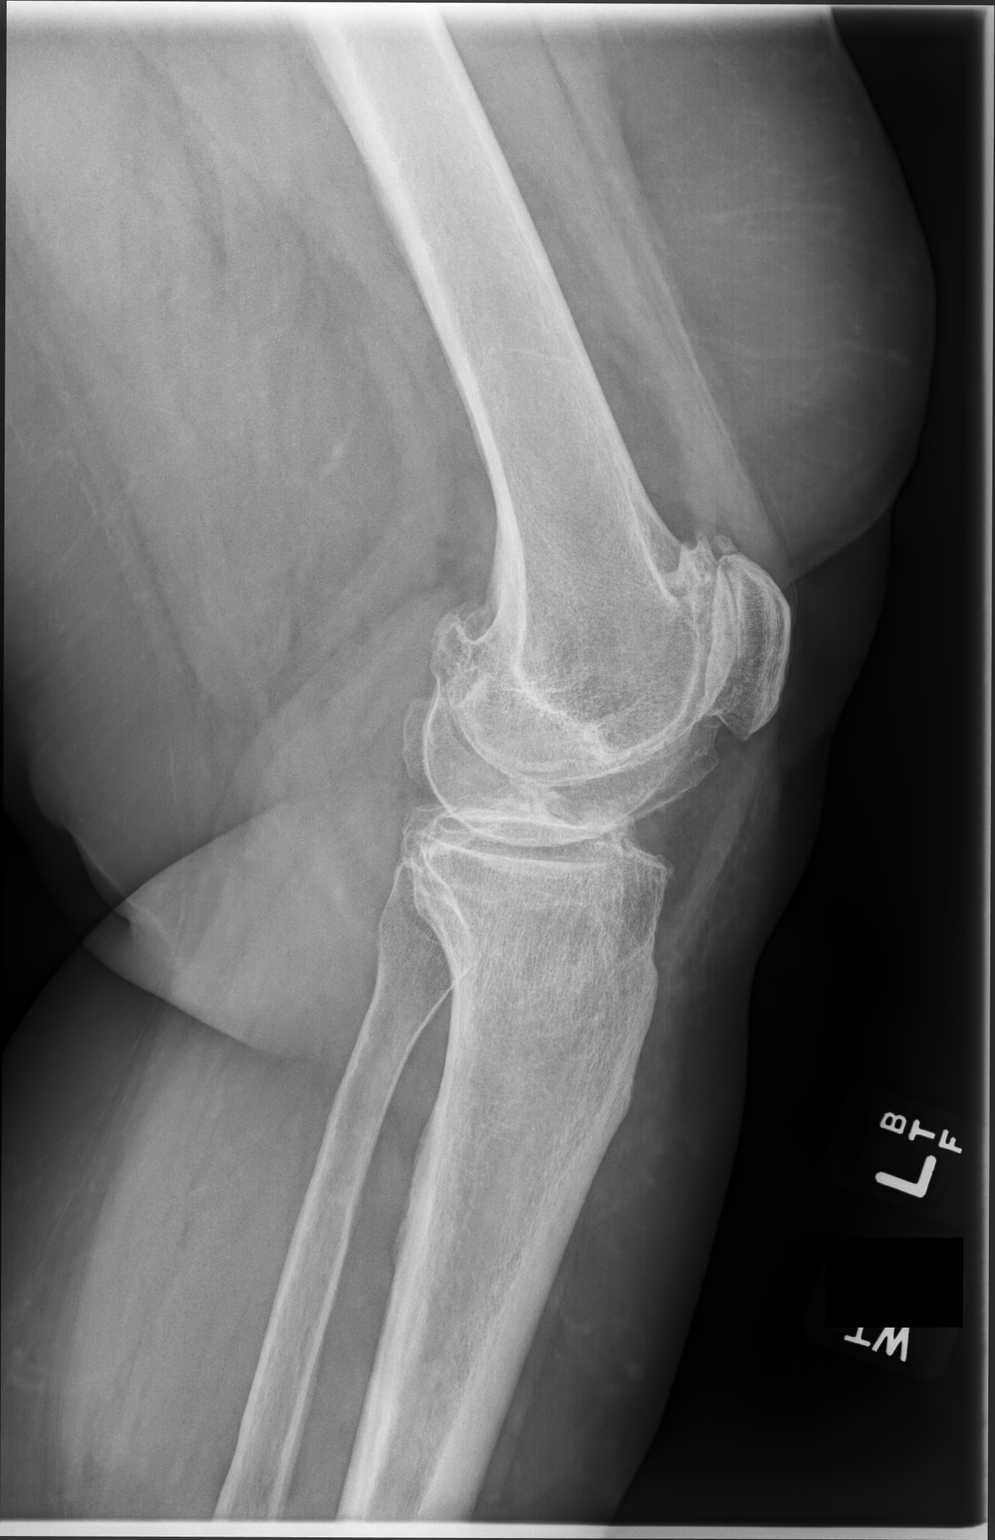

[sunrise]
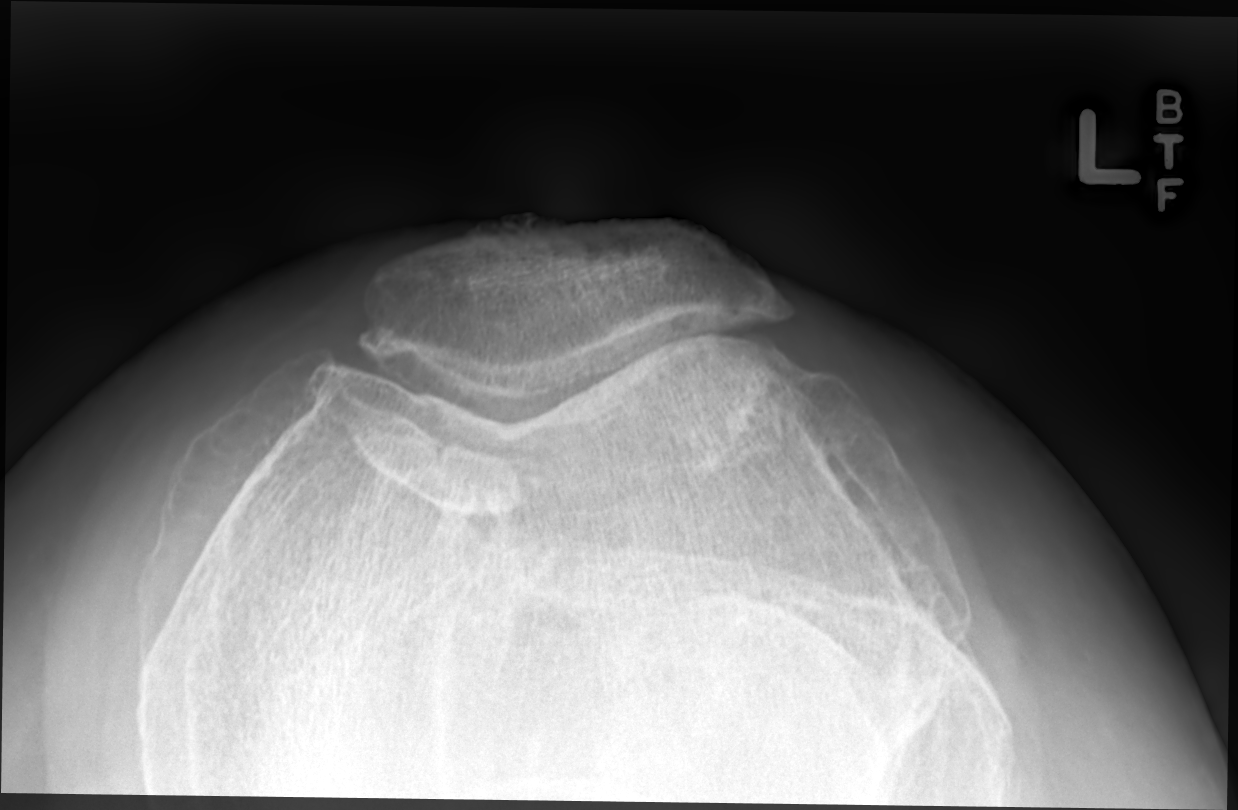

[3 of 3 positions shown; findings below may reference images not displayed]

FINDINGS: AP, lateral, and sunrise views. Advanced 3 compartment
osteoarthritis, moderate to severe in the medial and patellofemoral
compartments. Mild-to-moderate in the lateral compartment. No joint
effusion. No acute fracture or dislocation.
IMPRESSION: Degenerative change, without acute osseous finding.

## 2020-02-17 IMAGING — XA Imaging study
2 series · 2 of 2 positions shown · non-contrast
Comparison: none

CLINICAL DATA: Lumbosacral spondylosis without myelopathy. Right
leg radiculopathy. 90% relief for 1 month after prior injection on
04/05/2018.

[Series 4: ortho adipose · 1 of 1 slices shown (1 of 2)]
[im 1/1]
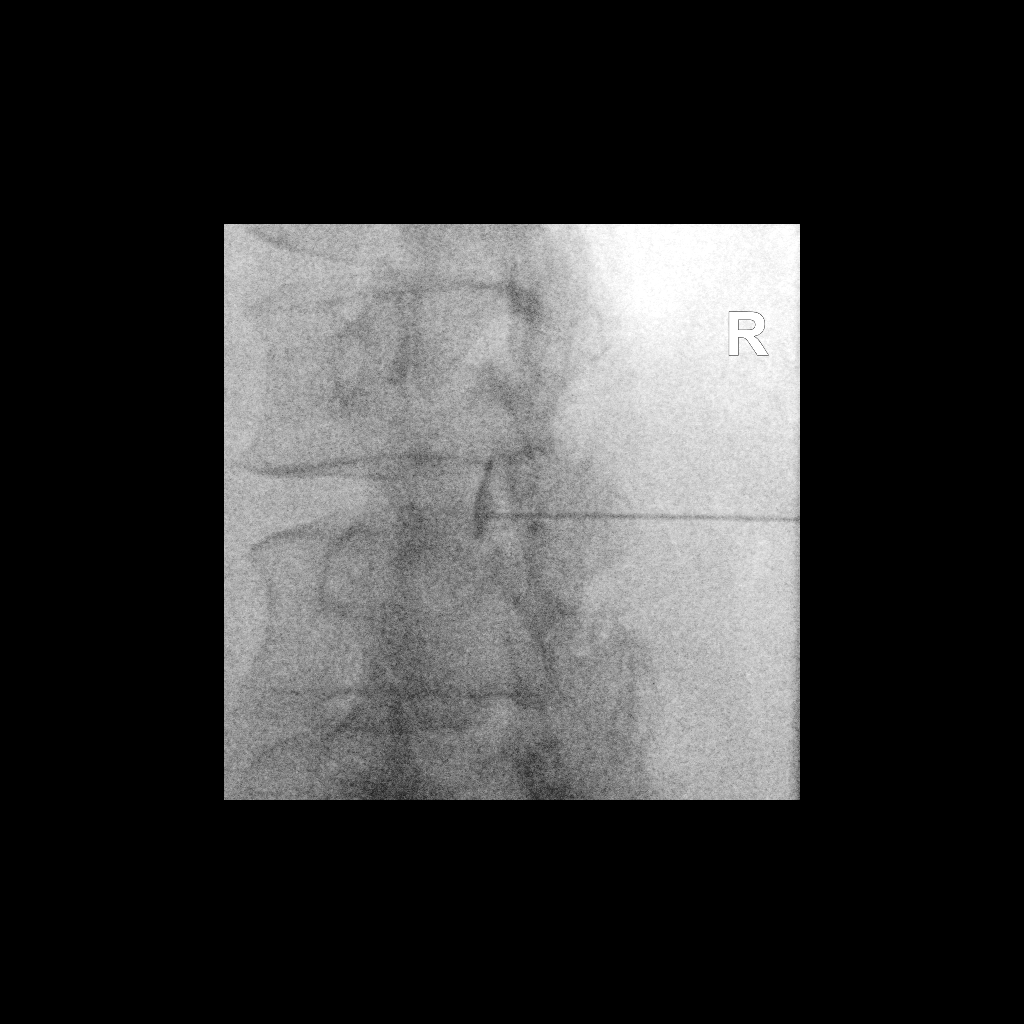

[Series 5: ortho adipose · 1 of 1 slices shown (2 of 2)]
[im 1/1]
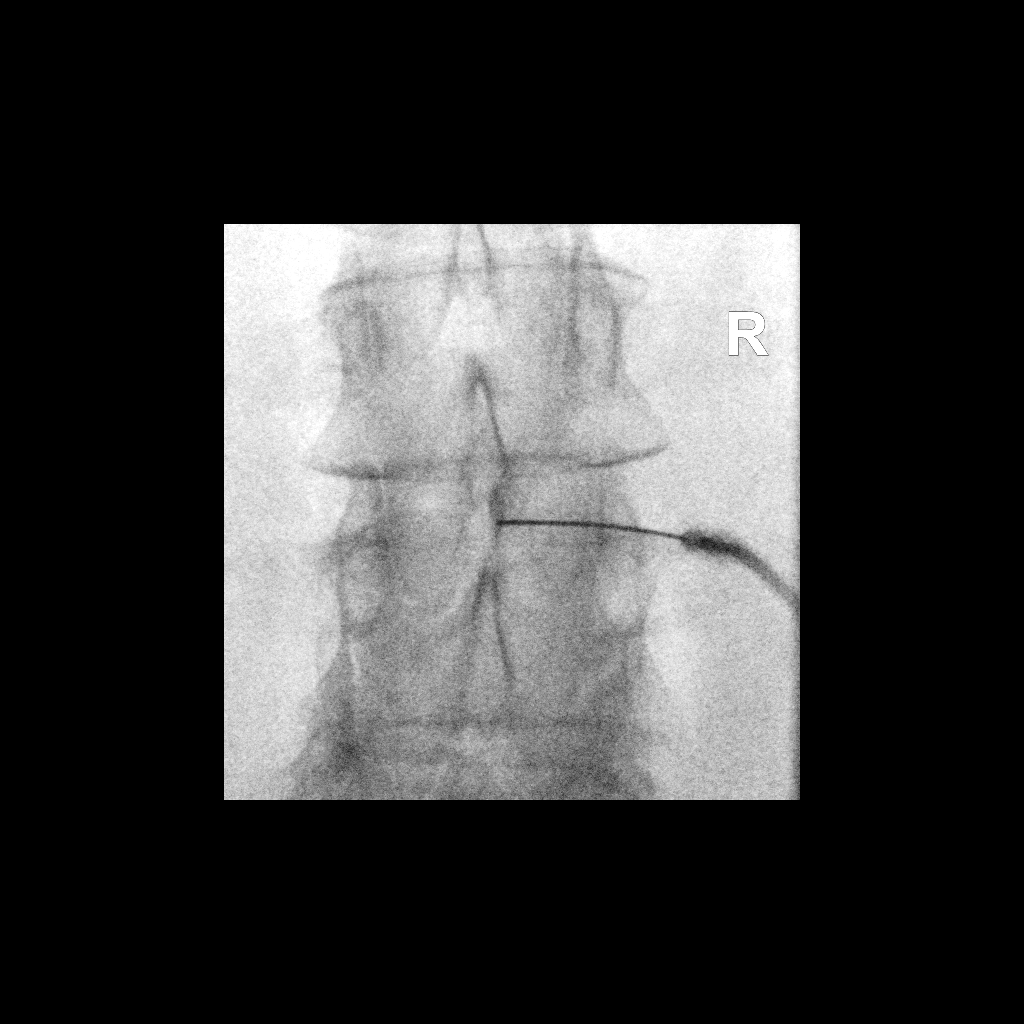

[2 of 2 positions shown; findings below may reference images not displayed]

FLUOROSCOPY TIME:  Radiation Exposure Index (as provided by the
fluoroscopic device): 10.8 mGy

Fluoroscopy Time:  1 second

Number of Acquired Images:  0

PROCEDURE:
The procedure, risks, benefits, and alternatives were explained to
the patient. Questions regarding the procedure were encouraged and
answered. The patient understands and consents to the procedure.

LUMBAR EPIDURAL INJECTION:

An interlaminar approach was performed on the right at L3-L4. The
overlying skin was cleansed and anesthetized. A 5 inch 22 gauge
spinal needle was advanced using loss-of-resistance technique.

DIAGNOSTIC EPIDURAL INJECTION:

Injection of Isovue-M 200 shows a good epidural pattern with spread
above and below the level of needle placement, primarily on the
right. No vascular opacification is seen.

THERAPEUTIC EPIDURAL INJECTION:

120 mg of Depo-Medrol mixed with 3 mL of 1% lidocaine were
instilled. The procedure was well-tolerated, and the patient was
discharged thirty minutes following the injection in good condition.

COMPLICATIONS:
None immediate.
IMPRESSION: Technically successful epidural injection on the right at L3-L4 #1.

## 2020-03-06 ENCOUNTER — Other Ambulatory Visit: Payer: Self-pay | Admitting: Internal Medicine

## 2020-03-06 DIAGNOSIS — Z1231 Encounter for screening mammogram for malignant neoplasm of breast: Secondary | ICD-10-CM

## 2020-03-21 ENCOUNTER — Other Ambulatory Visit: Payer: Self-pay

## 2020-03-21 ENCOUNTER — Ambulatory Visit
Admission: RE | Admit: 2020-03-21 | Discharge: 2020-03-21 | Disposition: A | Discharge status: Home / Self Care | Payer: Medicare Other | Source: Ambulatory Visit | Attending: Internal Medicine | Admitting: Internal Medicine

## 2020-03-21 DIAGNOSIS — Z1231 Encounter for screening mammogram for malignant neoplasm of breast: Secondary | ICD-10-CM

## 2020-04-02 DIAGNOSIS — Z1159 Encounter for screening for other viral diseases: Secondary | ICD-10-CM | POA: Diagnosis not present

## 2020-04-07 ENCOUNTER — Encounter: Payer: Self-pay | Admitting: Internal Medicine

## 2020-04-07 DIAGNOSIS — Z1211 Encounter for screening for malignant neoplasm of colon: Secondary | ICD-10-CM | POA: Diagnosis not present

## 2020-04-07 DIAGNOSIS — D122 Benign neoplasm of ascending colon: Secondary | ICD-10-CM | POA: Diagnosis not present

## 2020-04-07 DIAGNOSIS — D120 Benign neoplasm of cecum: Secondary | ICD-10-CM | POA: Diagnosis not present

## 2020-04-07 DIAGNOSIS — Z8371 Family history of colonic polyps: Secondary | ICD-10-CM | POA: Diagnosis not present

## 2020-04-07 DIAGNOSIS — K5730 Diverticulosis of large intestine without perforation or abscess without bleeding: Secondary | ICD-10-CM | POA: Diagnosis not present

## 2020-04-09 DIAGNOSIS — D122 Benign neoplasm of ascending colon: Secondary | ICD-10-CM | POA: Diagnosis not present

## 2020-04-09 DIAGNOSIS — D120 Benign neoplasm of cecum: Secondary | ICD-10-CM | POA: Diagnosis not present

## 2020-07-03 ENCOUNTER — Telehealth (INDEPENDENT_AMBULATORY_CARE_PROVIDER_SITE_OTHER): Payer: Medicare Other | Admitting: Internal Medicine

## 2020-07-03 ENCOUNTER — Encounter: Payer: Self-pay | Admitting: Internal Medicine

## 2020-07-03 ENCOUNTER — Other Ambulatory Visit: Payer: Self-pay

## 2020-07-03 VITALS — Ht 66.0 in

## 2020-07-03 DIAGNOSIS — L299 Pruritus, unspecified: Secondary | ICD-10-CM

## 2020-07-03 DIAGNOSIS — E786 Lipoprotein deficiency: Secondary | ICD-10-CM | POA: Diagnosis not present

## 2020-07-03 DIAGNOSIS — E538 Deficiency of other specified B group vitamins: Secondary | ICD-10-CM | POA: Diagnosis not present

## 2020-07-03 NOTE — Progress Notes (Signed)
Virtual Visit via Video Note  I connected with@ on 07/03/20 at 10:00 AM EDT by a video enabled telemedicine application and verified that I am speaking with the correct person using two identifiers. Location patient: home Location provider: home office Persons participating in the virtual visit: patient, provider  [place] national recommendations  regarding COVID 19 pandemic   video visit is advised over in office visit for this patient.  Patient aware  of the limitations of evaluation and management by telemedicine and  availability of in person appointments. and agreed to proceed.   HPI: Natasha [name] presents for video visit technical difficulties     Power outage   For wifi if turned into phone visit   Co 1.5 weeks of itching without rash  On arms left palm and upper middle chest between breast and abd without other sx rash hives exposures new creams topical detergent   And no  Illness prodrome and no meds supplements . Was told to be sure not a kidney problem.   Used otc itch cream , some help  Wants to make sure not serious   Past hx of scabies and not like that   ROS: See pertinent positives and negatives per HPI.  Past Medical History:  Diagnosis Date  . Allergic rhinitis   . Normal cardiac stress test     Past Surgical History:  Procedure Laterality Date  . MYRINGOTOMY     right 7 th grade    Family History  Problem Relation Age of Onset  . Lung disease Father   . Cancer Father        Colon  . Colon polyps Father   . Multiple sclerosis Mother   . Scleroderma Sister   . Parkinson's disease Maternal Grandmother   . Colon cancer Neg Hx   . Breast cancer Neg Hx     Social History   Tobacco Use  . Smoking status: Never Smoker  . Smokeless tobacco: Never Used  Vaping Use  . Vaping Use: Never used  Substance Use Topics  . Alcohol use: Yes    Alcohol/week: 1.0 standard drink    Types: 1 Standard drinks or equivalent per week    Comment: 1 every 2 weeks    . Drug use: No      Current Outpatient Medications:  .  azithromycin (ZITHROMAX) 500 MG tablet, Take 500 mg by mouth daily. (Patient not taking: Reported on 07/03/2020), Disp: , Rfl:  .  chlorhexidine (PERIDEX) 0.12 % solution, SMARTSIG:1 Capful(s) By Mouth Twice Daily (Patient not taking: Reported on 07/03/2020), Disp: , Rfl:   EXAM: BP Readings from Last 3 Encounters:  01/15/19 100/70  01/04/19 128/76  11/16/18 124/80    VITALS per patient if applicable:  GENERAL: alert, oriented, appears well and in no acute distress  Unable to view video  PSYCH/NEURO: pleasant and cooperative, no obvious depression or anxiety, speech and thought processing grossly intact Lab Results  Component Value Date   WBC 5.9 01/04/2019   HGB 14.5 01/04/2019   HCT 42.6 01/04/2019   PLT 244.0 01/04/2019   GLUCOSE 88 01/04/2019   CHOL 151 01/04/2019   TRIG 158.0 (H) 01/04/2019   HDL 26.60 (L) 01/04/2019   LDLCALC 93 01/04/2019   ALT 11 01/04/2019   AST 10 01/04/2019   NA 140 01/04/2019   K 3.9 01/04/2019   CL 105 01/04/2019   CREATININE 0.81 01/04/2019   BUN 13 01/04/2019   CO2 28 01/04/2019   TSH 1.77 01/04/2019  HGBA1C 5.0 08/18/2016    ASSESSMENT AND PLAN:  Discussed the following assessment and plan:    ICD-10-CM   1. Pruritus  L29.9 CBC with Differential/Platelet    Hepatic function panel    Lipid panel    TSH    T4, free    BASIC METABOLIC PANEL WITH GFR    Vitamin B12  2. Vitamin B 12 deficiency  E53.8 CBC with Differential/Platelet    Hepatic function panel    Lipid panel    TSH    T4, free    BASIC METABOLIC PANEL WITH GFR    Vitamin B12  3. Low HDL (under 40)  E78.6 CBC with Differential/Platelet    Hepatic function panel    Lipid panel    TSH    T4, free    BASIC METABOLIC PANEL WITH GFR    Vitamin B12  no hives  Or contact  Check labs r/o renal liver blood issues  Fu b12 not taking at this time  Get fasting lab [facility] tomorrow   And go from there   Try otc  zyrtec   For now Caution with some topical s  Cool  Avoid hot baths heat  Fu if  persistent or progressive etc   Counseled.   Expectant management and discussion of plan and treatment with opportunity to ask questions and all were answered. The patient agreed with the plan and demonstrated an understanding of the instructions.   Advised to call back or seek an in-person evaluation if worsening  or having  further concerns . Return for depending on results adn how doing . I provided  30 minutes of non-face-to-face time during this encounter. Planning and  follo wup    Shanon Ace, MD

## 2020-07-04 ENCOUNTER — Other Ambulatory Visit (INDEPENDENT_AMBULATORY_CARE_PROVIDER_SITE_OTHER): Payer: Medicare Other

## 2020-07-04 DIAGNOSIS — L299 Pruritus, unspecified: Secondary | ICD-10-CM | POA: Diagnosis not present

## 2020-07-04 DIAGNOSIS — E538 Deficiency of other specified B group vitamins: Secondary | ICD-10-CM | POA: Diagnosis not present

## 2020-07-04 DIAGNOSIS — E786 Lipoprotein deficiency: Secondary | ICD-10-CM | POA: Diagnosis not present

## 2020-07-04 LAB — HEPATIC FUNCTION PANEL
ALT: 14 U/L (ref 0–35)
AST: 13 U/L (ref 0–37)
Albumin: 4.5 g/dL (ref 3.5–5.2)
Alkaline Phosphatase: 85 U/L (ref 39–117)
Bilirubin, Direct: 0.1 mg/dL (ref 0.0–0.3)
Total Bilirubin: 0.8 mg/dL (ref 0.2–1.2)
Total Protein: 7.2 g/dL (ref 6.0–8.3)

## 2020-07-04 LAB — LIPID PANEL
Cholesterol: 144 mg/dL (ref 0–200)
HDL: 32.8 mg/dL — ABNORMAL LOW (ref >39.00)
LDL Cholesterol: 90 mg/dL (ref 0–99)
NonHDL: 111.34
Total CHOL/HDL Ratio: 4
Triglycerides: 105 mg/dL (ref 0.0–149.0)
VLDL: 21 mg/dL (ref 0.0–40.0)

## 2020-07-04 LAB — CBC WITH DIFFERENTIAL/PLATELET
Basophils Absolute: 0.1 10*3/uL (ref 0.0–0.1)
Basophils Relative: 1.3 % (ref 0.0–3.0)
Eosinophils Absolute: 0.4 10*3/uL (ref 0.0–0.7)
Eosinophils Relative: 7.9 % — ABNORMAL HIGH (ref 0.0–5.0)
HCT: 42.3 % (ref 36.0–46.0)
Hemoglobin: 14.4 g/dL (ref 12.0–15.0)
Lymphocytes Relative: 25.3 % (ref 12.0–46.0)
Lymphs Abs: 1.3 10*3/uL (ref 0.7–4.0)
MCHC: 34.1 g/dL (ref 30.0–36.0)
MCV: 95.3 fl (ref 78.0–100.0)
Monocytes Absolute: 0.5 10*3/uL (ref 0.1–1.0)
Monocytes Relative: 9.2 % (ref 3.0–12.0)
Neutro Abs: 2.8 10*3/uL (ref 1.4–7.7)
Neutrophils Relative %: 56.3 % (ref 43.0–77.0)
Platelets: 245 10*3/uL (ref 150.0–400.0)
RBC: 4.44 Mil/uL (ref 3.87–5.11)
RDW: 12.8 % (ref 11.5–15.5)
WBC: 5 10*3/uL (ref 4.0–10.5)

## 2020-07-04 LAB — VITAMIN B12: Vitamin B-12: 246 pg/mL (ref 211–911)

## 2020-07-04 LAB — T4, FREE: Free T4: 1.05 ng/dL (ref 0.60–1.60)

## 2020-07-04 LAB — TSH: TSH: 1.51 u[IU]/mL (ref 0.35–4.50)

## 2020-07-04 NOTE — Progress Notes (Signed)
liver normal  Blood count basically normal ( slight elevation in cells that react to allergy)  Waiting on  results of kidney function B12 is low normal  go back on b12 take 1000 sublingual dissolvable per day  Cholesterol  hdl some what improved  from last year .

## 2020-07-04 NOTE — Addendum Note (Signed)
Addended by: Jacob Moores on: 07/04/2020 12:37 PM   Modules accepted: Orders

## 2020-07-04 NOTE — Addendum Note (Signed)
Addended by: Jacob Moores on: 07/04/2020 12:38 PM   Modules accepted: Orders

## 2020-07-05 LAB — BASIC METABOLIC PANEL WITH GFR
BUN/Creatinine Ratio: 17 (calc) (ref 6–22)
BUN: 18 mg/dL (ref 7–25)
CO2: 25 mmol/L (ref 20–32)
Calcium: 9.8 mg/dL (ref 8.6–10.4)
Chloride: 107 mmol/L (ref 98–110)
Creat: 1.06 mg/dL — ABNORMAL HIGH (ref 0.50–0.99)
GFR, Est African American: 63 mL/min/{1.73_m2} (ref >=60)
GFR, Est Non African American: 55 mL/min/{1.73_m2} — ABNORMAL LOW (ref >=60)
Glucose, Bld: 97 mg/dL (ref 65–99)
Potassium: 4.4 mmol/L (ref 3.5–5.3)
Sodium: 142 mmol/L (ref 135–146)

## 2020-07-06 NOTE — Progress Notes (Signed)
So kidney function reads as borderline  decrease ( uncertain significance  this is a  different reference lab and   not a rare finding in over 30 age group) )      rest of chemistry is normal .  So lets   repeat  a non fasting  bmp, a  urinalysis with midro at the [facility]  and urine micro albumin /creatinine ratio . In about a month .( megan please place these orders)  Theses labs  should help assess  and rule out   any active kidney problem   Ido not think this finding has anything to do   with the itching .  I would avoid  or limit nsaids  that can be  toxic to the kidneys .

## 2020-07-07 ENCOUNTER — Other Ambulatory Visit: Payer: Self-pay

## 2020-07-07 DIAGNOSIS — R7989 Other specified abnormal findings of blood chemistry: Secondary | ICD-10-CM

## 2020-07-07 DIAGNOSIS — R944 Abnormal results of kidney function studies: Secondary | ICD-10-CM

## 2020-07-30 ENCOUNTER — Encounter: Payer: Self-pay | Admitting: Internal Medicine

## 2020-07-30 ENCOUNTER — Other Ambulatory Visit: Payer: Self-pay

## 2020-07-30 ENCOUNTER — Ambulatory Visit (INDEPENDENT_AMBULATORY_CARE_PROVIDER_SITE_OTHER): Payer: Medicare Other | Admitting: Internal Medicine

## 2020-07-30 VITALS — BP 120/78 | HR 77 | Temp 98.0°F

## 2020-07-30 DIAGNOSIS — L509 Urticaria, unspecified: Secondary | ICD-10-CM

## 2020-07-30 DIAGNOSIS — L299 Pruritus, unspecified: Secondary | ICD-10-CM | POA: Diagnosis not present

## 2020-07-30 MED ORDER — PREDNISONE 20 MG PO TABS
ORAL_TABLET | ORAL | 0 refills | Status: DC
Start: 2020-07-30 — End: 2020-10-06

## 2020-07-30 NOTE — Patient Instructions (Signed)
This is a hive type rash   Allergic not sure why   At this time .   Stay on  Antihistamine   Around the clock   Zyrtec and can add on  Benadryl.   As needed  Add steroid  Taper and will do allergy consults .    Hives Hives (urticaria) are itchy, red, swollen areas on the skin. Hives can appear on any part of the body. Hives often fade within 24 hours (acute hives). Sometimes, new hives appear after old ones fade and the cycle can continue for several days or weeks (chronic hives). Hives do not spread from person to person (are not contagious). Hives come from the body's reaction to something a person is allergic to (allergen), something that causes irritation, or various other triggers. When a person is exposed to a trigger, his or her body releases a chemical (histamine) that causes redness, itching, and swelling. Hives can appear right after exposure to a trigger or hours later. What are the causes? This condition may be caused by:  Allergies to foods or ingredients.  Insect bites or stings.  Exposure to pollen or pets.  Contact with latex or chemicals.  Spending time in sunlight, heat, or cold (exposure).  Exercise.  Stress.  Certain medicines. You can also get hives from other medical conditions and treatments, such as:  Viruses, including the common cold.  Bacterial infections, such as urinary tract infections and strep throat.  Certain medicines.  Allergy shots.  Blood transfusions. Sometimes, the cause of this condition is not known (idiopathic hives). What increases the risk? You are more likely to develop this condition if you:  Are a woman.  Have food allergies, especially to citrus fruits, milk, eggs, peanuts, tree nuts, or shellfish.  Are allergic to: ? Medicines. ? Latex. ? Insects. ? Animals. ? Pollen. What are the signs or symptoms? Common symptoms of this condition include raised, itchy, red or white bumps or patches on your skin. These areas  may:  Become large and swollen (welts).  Change in shape and location, quickly and repeatedly.  Be separate hives or connect over a large area of skin.  Sting or become painful.  Turn white when pressed in the center (blanch). In severe cases, yourhands, feet, and face may also become swollen. This may occur if hives develop deeper in your skin. How is this diagnosed? This condition may be diagnosed by your symptoms, medical history, and physical exam.  Your skin, urine, or blood may be tested to find out what is causing your hives and to rule out other health issues.  Your health care provider may also remove a small sample of skin from the affected area and examine it under a microscope (biopsy). How is this treated? Treatment for this condition depends on the cause and severity of your symptoms. Your health care provider may recommend using cool, wet cloths (cool compresses) or taking cool showers to relieve itching. Treatment may include:  Medicines that help: ? Relieve itching (antihistamines). ? Reduce swelling (corticosteroids). ? Treat infection (antibiotics).  An injectable medicine (omalizumab). Your health care provider may prescribe this if you have chronic idiopathic hives and you continue to have symptoms even after treatment with antihistamines. Severe cases may require an emergency injection of adrenaline (epinephrine) to prevent a life-threatening allergic reaction (anaphylaxis). Follow these instructions at home: Medicines  Take and apply over-the-counter and prescription medicines only as told by your health care provider.  If you were prescribed an  antibiotic medicine, take it as told by your health care provider. Do not stop using the antibiotic even if you start to feel better. Skin care  Apply cool compresses to the affected areas.  Do not scratch or rub your skin. General instructions  Do not take hot showers or baths. This can make itching  worse.  Do not wear tight-fitting clothing.  Use sunscreen and wear protective clothing when you are outside.  Avoid any substances that cause your hives. Keep a journal to help track what causes your hives. Write down: ? What medicines you take. ? What you eat and drink. ? What products you use on your skin.  Keep all follow-up visits as told by your health care provider. This is important. Contact a health care provider if:  Your symptoms are not controlled with medicine.  Your joints are painful or swollen. Get help right away if:  You have a fever.  You have pain in your abdomen.  Your tongue or lips are swollen.  Your eyelids are swollen.  Your chest or throat feels tight.  You have trouble breathing or swallowing. These symptoms may represent a serious problem that is an emergency. Do not wait to see if the symptoms will go away. Get medical help right away. Call your local emergency services (911 in the [place] Do not drive yourself to the hospital. Summary  Hives (urticaria) are itchy, red, swollen areas on your skin. Hives come from the body's reaction to something a person is allergic to (allergen), something that causes irritation, or various other triggers.  Treatment for this condition depends on the cause and severity of your symptoms.  Avoid any substances that cause your hives. Keep a journal to help track what causes your hives.  Take and apply over-the-counter and prescription medicines only as told by your health care provider.  Keep all follow-up visits as told by your health care provider. This is important. This information is not intended to replace advice given to you by your health care provider. Make sure you discuss any questions you have with your health care provider. Document Revised: 06/07/2018 Document Reviewed: 06/07/2018 Elsevier Patient Education  [place].

## 2020-07-30 NOTE — Progress Notes (Signed)
Chief Complaint  Patient presents with  . Follow-up    going on for over a month, several areas of rash    HPI: Natasha [name] 69 y.o. come in for  Itching and rash  See last televisit no rash but itching that subsided and took trip to [place]  And went well then  Began to itch again left palm and between breast and ingunal are and swelling arms  And this am right face  Eye lid and  Cheek area ,   No fever  Only other new sx second of  Right head pain without assox aor residual  Also says when closes eyes  Like a motion picture   And opens wyes  Last eye eacm was normal  Year ago  No lov or scitilla  No new neuro sx  Falling  Fever infection  No past hx of psoariasis    ROS: See pertinent positives and negatives per HPI.  Past Medical History:  Diagnosis Date  . Allergic rhinitis   . Normal cardiac stress test     Family History  Problem Relation Age of Onset  . Lung disease Father   . Cancer Father        Colon  . Colon polyps Father   . Multiple sclerosis Mother   . Scleroderma Sister   . Parkinson's disease Maternal Grandmother   . Colon cancer Neg Hx   . Breast cancer Neg Hx     Social History   Socioeconomic History  . Marital status: Married    Spouse name: Not on file  . Number of children: Not on file  . Years of education: Not on file  . Highest education level: Not on file  Occupational History  . Not on file  Tobacco Use  . Smoking status: Never Smoker  . Smokeless tobacco: Never Used  Vaping Use  . Vaping Use: Never used  Substance and Sexual Activity  . Alcohol use: Yes    Alcohol/week: 1.0 standard drink    Types: 1 Standard drinks or equivalent per week    Comment: 1 every 2 weeks   . Drug use: No  . Sexual activity: Not on file  Other Topics Concern  . 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    No tob   Social  Determinants of Health   Financial Resource Strain:   . Difficulty of Paying Living Expenses: Not on file  Food Insecurity:   . Worried About Charity fundraiser in the Last Year: Not on file  . Ran Out of Food in the Last Year: Not on file  Transportation Needs:   . Lack of Transportation (Medical): Not on file  . Lack of Transportation (Non-Medical): Not on file  Physical Activity:   . Days of Exercise per Week: Not on file  . Minutes of Exercise per Session: Not on file  Stress:   . Feeling of Stress : Not on file  Social Connections:   . Frequency of Communication with Friends and Family: Not on file  . Frequency of Social Gatherings with Friends and Family: Not on file  . Attends Religious Services: Not on file  . Active Member of Clubs or Organizations: Not on file  . Attends Archivist Meetings: Not on file  .  Marital Status: Not on file    Outpatient Medications Prior to Visit  Medication Sig Dispense Refill  . azithromycin (ZITHROMAX) 500 MG tablet Take 500 mg by mouth daily. (Patient not taking: Reported on 07/30/2020)    . chlorhexidine (PERIDEX) 0.12 % solution SMARTSIG:1 Capful(s) By Mouth Twice Daily (Patient not taking: Reported on 07/30/2020)     No facility-administered medications prior to visit.     EXAM:  BP 120/78   Pulse 77   Temp 98 F (36.7 C) (Oral)   SpO2 96%   There is no height or weight on file to calculate BMI.  GENERAL: vitals reviewed and listed above, alert, oriented, appears well hydrated and in no acute distress with righ upper face mild edema and redness  eoms nl nl speech a dn no angioedema  HEENT: atraumatic, conjunctiva  clear, no obvious abnormalities on inspection of external nose and ears OP : masked  NECK: no obvious masses on inspection palpation  LUNGS: clear to auscultation bilaterally, no wheezes, rales or rhonchi, good air movement CV: HRRR, no clubbing cyanosis or  peripheral edema nl cap refill  Skin blotchy  right forarrm rash and papular hives left  Palms minimal rash tiny veic    Has mid line chest rash abut no intertrigenous   Inguinal  Redness no satellit  No weeping  Back is clear  MS: moves all extremities without noticeable focal  abnormality PSYCH: pleasant and cooperative, no obvious depression or anxiety Lab Results  Component Value Date   WBC 5.0 07/04/2020   HGB 14.4 07/04/2020   HCT 42.3 07/04/2020   PLT 245.0 07/04/2020   GLUCOSE 97 07/04/2020   CHOL 144 07/04/2020   TRIG 105.0 07/04/2020   HDL 32.80 (L) 07/04/2020   LDLCALC 90 07/04/2020   ALT 14 07/04/2020   AST 13 07/04/2020   NA 142 07/04/2020   K 4.4 07/04/2020   CL 107 07/04/2020   CREATININE 1.06 (H) 07/04/2020   BUN 18 07/04/2020   CO2 25 07/04/2020   TSH 1.51 07/04/2020   HGBA1C 5.0 08/18/2016   BP Readings from Last 3 Encounters:  07/30/20 120/78  01/15/19 100/70  01/04/19 128/76    ASSESSMENT AND PLAN:  Discussed the following assessment and plan:  Hives - Plan: Ambulatory referral to Allergy  Pruritus - Plan: Ambulatory referral to Allergy intertrigo  Rash also  but no obv yeast     Do not  Think inverse psoriasis or guttate   Seems hive like  Prednisone  12 day  2 around the clock antihistamine  Cool compresses if needed   Allergy referral  Description of second of pain r head area  doesn't sound intracranial without  Assoc sx but   contact if progressive sx   and get updated eye check  don't think related .   -Patient advised to return or notify health care team  if  new concerns arise.  Patient Instructions  This is a hive type rash   Allergic not sure why   At this time .   Stay on  Antihistamine   Around the clock   Zyrtec and can add on  Benadryl.   As needed  Add steroid  Taper and will do allergy consults .    Hives Hives (urticaria) are itchy, red, swollen areas on the skin. Hives can appear on any part of the body. Hives often fade within 24 hours (acute hives). Sometimes, new  hives appear after old ones fade and the  cycle can continue for several days or weeks (chronic hives). Hives do not spread from person to person (are not contagious). Hives come from the body's reaction to something a person is allergic to (allergen), something that causes irritation, or various other triggers. When a person is exposed to a trigger, his or her body releases a chemical (histamine) that causes redness, itching, and swelling. Hives can appear right after exposure to a trigger or hours later. What are the causes? This condition may be caused by:  Allergies to foods or ingredients.  Insect bites or stings.  Exposure to pollen or pets.  Contact with latex or chemicals.  Spending time in sunlight, heat, or cold (exposure).  Exercise.  Stress.  Certain medicines. You can also get hives from other medical conditions and treatments, such as:  Viruses, including the common cold.  Bacterial infections, such as urinary tract infections and strep throat.  Certain medicines.  Allergy shots.  Blood transfusions. Sometimes, the cause of this condition is not known (idiopathic hives). What increases the risk? You are more likely to develop this condition if you:  Are a woman.  Have food allergies, especially to citrus fruits, milk, eggs, peanuts, tree nuts, or shellfish.  Are allergic to: ? Medicines. ? Latex. ? Insects. ? Animals. ? Pollen. What are the signs or symptoms? Common symptoms of this condition include raised, itchy, red or white bumps or patches on your skin. These areas may:  Become large and swollen (welts).  Change in shape and location, quickly and repeatedly.  Be separate hives or connect over a large area of skin.  Sting or become painful.  Turn white when pressed in the center (blanch). In severe cases, yourhands, feet, and face may also become swollen. This may occur if hives develop deeper in your skin. How is this diagnosed? This  condition may be diagnosed by your symptoms, medical history, and physical exam.  Your skin, urine, or blood may be tested to find out what is causing your hives and to rule out other health issues.  Your health care provider may also remove a small sample of skin from the affected area and examine it under a microscope (biopsy). How is this treated? Treatment for this condition depends on the cause and severity of your symptoms. Your health care provider may recommend using cool, wet cloths (cool compresses) or taking cool showers to relieve itching. Treatment may include:  Medicines that help: ? Relieve itching (antihistamines). ? Reduce swelling (corticosteroids). ? Treat infection (antibiotics).  An injectable medicine (omalizumab). Your health care provider may prescribe this if you have chronic idiopathic hives and you continue to have symptoms even after treatment with antihistamines. Severe cases may require an emergency injection of adrenaline (epinephrine) to prevent a life-threatening allergic reaction (anaphylaxis). Follow these instructions at home: Medicines  Take and apply over-the-counter and prescription medicines only as told by your health care provider.  If you were prescribed an antibiotic medicine, take it as told by your health care provider. Do not stop using the antibiotic even if you start to feel better. Skin care  Apply cool compresses to the affected areas.  Do not scratch or rub your skin. General instructions  Do not take hot showers or baths. This can make itching worse.  Do not wear tight-fitting clothing.  Use sunscreen and wear protective clothing when you are outside.  Avoid any substances that cause your hives. Keep a journal to help track what causes your hives. Write down: ?  What medicines you take. ? What you eat and drink. ? What products you use on your skin.  Keep all follow-up visits as told by your health care provider. This is  important. Contact a health care provider if:  Your symptoms are not controlled with medicine.  Your joints are painful or swollen. Get help right away if:  You have a fever.  You have pain in your abdomen.  Your tongue or lips are swollen.  Your eyelids are swollen.  Your chest or throat feels tight.  You have trouble breathing or swallowing. These symptoms may represent a serious problem that is an emergency. Do not wait to see if the symptoms will go away. Get medical help right away. Call your local emergency services (911 in the [place] Do not drive yourself to the hospital. Summary  Hives (urticaria) are itchy, red, swollen areas on your skin. Hives come from the body's reaction to something a person is allergic to (allergen), something that causes irritation, or various other triggers.  Treatment for this condition depends on the cause and severity of your symptoms.  Avoid any substances that cause your hives. Keep a journal to help track what causes your hives.  Take and apply over-the-counter and prescription medicines only as told by your health care provider.  Keep all follow-up visits as told by your health care provider. This is important. This information is not intended to replace advice given to you by your health care provider. Make sure you discuss any questions you have with your health care provider. Document Revised: 06/07/2018 Document Reviewed: 06/07/2018 Elsevier Patient Education  2020 [place] Malary Aylesworth M.D.

## 2020-08-06 NOTE — Telephone Encounter (Signed)
Want you to see allergist  Has that happened yet?

## 2020-08-26 DIAGNOSIS — Z23 Encounter for immunization: Secondary | ICD-10-CM | POA: Diagnosis not present

## 2020-09-08 ENCOUNTER — Ambulatory Visit (INDEPENDENT_AMBULATORY_CARE_PROVIDER_SITE_OTHER): Payer: Medicare Other | Admitting: Family Medicine

## 2020-09-08 ENCOUNTER — Encounter: Payer: Self-pay | Admitting: Family Medicine

## 2020-09-08 ENCOUNTER — Ambulatory Visit (INDEPENDENT_AMBULATORY_CARE_PROVIDER_SITE_OTHER): Payer: Medicare Other

## 2020-09-08 ENCOUNTER — Other Ambulatory Visit: Payer: Self-pay

## 2020-09-08 ENCOUNTER — Ambulatory Visit: Payer: Self-pay

## 2020-09-08 VITALS — BP 110/82 | HR 90 | Ht 66.0 in

## 2020-09-08 DIAGNOSIS — G8929 Other chronic pain: Secondary | ICD-10-CM | POA: Diagnosis not present

## 2020-09-08 DIAGNOSIS — M25561 Pain in right knee: Secondary | ICD-10-CM

## 2020-09-08 DIAGNOSIS — M1711 Unilateral primary osteoarthritis, right knee: Secondary | ICD-10-CM | POA: Diagnosis not present

## 2020-09-08 DIAGNOSIS — M170 Bilateral primary osteoarthritis of knee: Secondary | ICD-10-CM | POA: Diagnosis not present

## 2020-09-08 NOTE — Patient Instructions (Signed)
Good to see you pennsaid pinkie amount topically 2 times daily as needed.  Ice 20 minutes 2 times daily. Usually after activity and before bed. Exercise 3 times a week See me again in 5-6 weeks

## 2020-09-08 NOTE — Progress Notes (Signed)
[facility] [address] [place] Phone: [phone] Subjective:   [name] am serving as a Education administrator for Dr. Hulan Saas.  This visit occurred during the SARS-CoV-2 public health emergency.  Safety protocols were in place, including screening questions prior to the visit, additional usage of staff PPE, and extensive cleaning of exam room while observing appropriate contact time as indicated for disinfecting solutions.   I'm seeing this patient by the request  of:  Panosh, Standley Brooking, MD  CC: Right knee pain  CVE:[id number]  Natasha [name] is a 69 y.o. female coming in with complaint of right knee pain. States that with extension she has intense pain. History of sciatica on the right side.   Onset- Chronic  Location - right knee  Duration- pain is unpredictable  Character- sharp Aggravating factors- walking, twisting  Reliving factors-  Therapies tried-  Severity- 9-10/10 at its worse      Past Medical History:  Diagnosis Date  . Allergic rhinitis   . Normal cardiac stress test    Past Surgical History:  Procedure Laterality Date  . MYRINGOTOMY     right 7 th grade   Social History   Socioeconomic History  . Marital status: Married    Spouse name: Not on file  . Number of children: Not on file  . Years of education: Not on file  . Highest education level: Not on file  Occupational History  . Not on file  Tobacco Use  . Smoking status: Never Smoker  . Smokeless tobacco: Never Used  Vaping Use  . Vaping Use: Never used  Substance and Sexual Activity  . Alcohol use: Yes    Alcohol/week: 1.0 standard drink    Types: 1 Standard drinks or equivalent per week    Comment: 1 every 2 weeks   . Drug use: No  . Sexual activity: Not on file  Other Topics Concern  . 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    No tob   Social Determinants of Health   Financial Resource Strain:   . Difficulty of Paying Living Expenses: Not on file  Food Insecurity:   . Worried About Charity fundraiser in the Last Year: Not on file  . Ran Out of Food in the Last Year: Not on file  Transportation Needs:   . Lack of Transportation (Medical): Not on file  . Lack of Transportation (Non-Medical): Not on file  Physical Activity:   . Days of Exercise per Week: Not on file  . Minutes of Exercise per Session: Not on file  Stress:   . Feeling of Stress : Not on file  Social Connections:   . Frequency of Communication with Friends and Family: Not on file  . Frequency of Social Gatherings with Friends and Family: Not on file  . Attends Religious Services: Not on file  . Active Member of Clubs or Organizations: Not on file  . Attends Archivist Meetings: Not on file  . Marital Status: Not on file   No Known Allergies Family History  Problem Relation Age of Onset  . Lung disease Father   . Cancer Father        Colon  . Colon polyps Father   . Multiple sclerosis Mother   .  Scleroderma Sister   . Parkinson's disease Maternal Grandmother   . Colon cancer Neg Hx   . Breast cancer Neg Hx     Current Outpatient Medications (Endocrine & Metabolic):  .  predniSONE (DELTASONE) 20 MG tablet, Take 3,3,3,2,2,2,1,1,1, 1/.2 1./2 1/.2 pills qd      Current Outpatient Medications (Other):  .  azithromycin (ZITHROMAX) 500 MG tablet, Take 500 mg by mouth daily.  .  chlorhexidine (PERIDEX) 0.12 % solution,    Reviewed prior external information including notes and imaging from  primary care provider As well as notes that were available from care everywhere and other healthcare systems.  Past medical history, social, surgical and family history all reviewed in electronic medical record.  No pertanent information unless stated regarding to the chief complaint.   Review of Systems:  No  headache, visual changes, nausea, vomiting, diarrhea, constipation, dizziness, abdominal pain, skin rash, fevers, chills, night sweats, weight loss, swollen lymph nodes, body aches, joint swelling, chest pain, shortness of breath, mood changes. POSITIVE muscle aches  Objective  Blood pressure 110/82, pulse 90, height 5\' 6"  (1.676 m), SpO2 96 %.   General: No apparent distress alert and oriented x3 mood and affect normal, dressed appropriately.  Overweight HEENT: Pupils equal, extraocular movements intact  Respiratory: Patient's speak in full sentences and does not appear short of breath  Cardiovascular: No lower extremity edema, non tender, no erythema  Gait severely antalgic favoring the right knee walking with the aid of a cane Knee: Right valgus deformity noted. Large thigh to calf ratio.  Tender to palpation over medial and PF joint line.  Severe lateral tracking of the patella noted ROM full in flexion and extension and lower leg rotation. instability with valgus force.  painful patellar compression. Patellar glide with severe crepitus. Patellar and quadriceps tendons unremarkable. Hamstring and quadriceps strength is normal. Contralateral knee shows moderate arthritic changes but no instability  Limited musculoskeletal ultrasound was performed and interpreted by Lyndal Pulley  Limited musculoskeletal ultrasound shows the patient does have severe arthritic changes with near bone-on-bone in the patellofemoral joint noted.  Patient does have severe what appears to be an acute on chronic tear of the medial meniscus with displacement noted. Impression: Arthritic changes with medial meniscal tear   Impression and Recommendations:     The above documentation has been reviewed and is accurate and complete Lyndal Pulley, DO

## 2020-09-08 NOTE — Assessment & Plan Note (Signed)
Known significant arthritic changes previously.  We will repeat x-rays today.  Patient also has what appears to be a meniscal injury.  Discussed the potential for injection but at this time withhold follow-up encourage weight loss that I think will be beneficial, topical anti-inflammatories, patient will follow up again in 6 weeks.  Patient is to be going to [employer] in December and would like to be able to walk a little better.  Due to the abnormal thigh to calf ratio in the instability of the joint I do feel an only stability brace with a Tru pull lite would be beneficial.  Follow-up again 8 to 10 weeks.

## 2020-10-06 ENCOUNTER — Telehealth: Payer: Self-pay | Admitting: Family Medicine

## 2020-10-06 MED ORDER — PREDNISONE 20 MG PO TABS: ORAL_TABLET | ORAL | 0 refills | Status: DC

## 2020-10-06 NOTE — Telephone Encounter (Signed)
Pt is leaving next week to travel to [place]. There is a lot of travel and walking, so she is worried. She would like an RX for tapered prednisone that she could start at the beginning of her trip.  [place]

## 2020-10-06 NOTE — Telephone Encounter (Signed)
Patient notified

## 2020-10-06 NOTE — Telephone Encounter (Signed)
Sent in what has been prescribed in the past

## 2020-10-15 ENCOUNTER — Ambulatory Visit: Payer: Medicare Other | Admitting: Family Medicine

## 2020-10-23 ENCOUNTER — Ambulatory Visit (INDEPENDENT_AMBULATORY_CARE_PROVIDER_SITE_OTHER): Payer: Medicare Other | Admitting: Family Medicine

## 2020-10-23 ENCOUNTER — Other Ambulatory Visit: Payer: Self-pay

## 2020-10-23 ENCOUNTER — Encounter: Payer: Self-pay | Admitting: Family Medicine

## 2020-10-23 VITALS — BP 148/90 | HR 85 | Ht 66.0 in

## 2020-10-23 DIAGNOSIS — E038 Other specified hypothyroidism: Secondary | ICD-10-CM | POA: Diagnosis not present

## 2020-10-23 DIAGNOSIS — E559 Vitamin D deficiency, unspecified: Secondary | ICD-10-CM

## 2020-10-23 DIAGNOSIS — M2550 Pain in unspecified joint: Secondary | ICD-10-CM

## 2020-10-23 DIAGNOSIS — M170 Bilateral primary osteoarthritis of knee: Secondary | ICD-10-CM | POA: Diagnosis not present

## 2020-10-23 LAB — VITAMIN D 25 HYDROXY (VIT D DEFICIENCY, FRACTURES): VITD: 23.77 ng/mL — ABNORMAL LOW (ref 30.00–100.00)

## 2020-10-23 LAB — IBC PANEL
Iron: 80 ug/dL (ref 42–145)
Saturation Ratios: 26.1 % (ref 20.0–50.0)
Transferrin: 219 mg/dL (ref 212.0–360.0)

## 2020-10-23 LAB — CBC WITH DIFFERENTIAL/PLATELET
Basophils Absolute: 0 10*3/uL (ref 0.0–0.1)
Basophils Relative: 0.7 % (ref 0.0–3.0)
Eosinophils Absolute: 0.4 10*3/uL (ref 0.0–0.7)
Eosinophils Relative: 5.8 % — ABNORMAL HIGH (ref 0.0–5.0)
HCT: 44.4 % (ref 36.0–46.0)
Hemoglobin: 14.9 g/dL (ref 12.0–15.0)
Lymphocytes Relative: 18.5 % (ref 12.0–46.0)
Lymphs Abs: 1.2 10*3/uL (ref 0.7–4.0)
MCHC: 33.4 g/dL (ref 30.0–36.0)
MCV: 95.4 fl (ref 78.0–100.0)
Monocytes Absolute: 0.5 10*3/uL (ref 0.1–1.0)
Monocytes Relative: 8.1 % (ref 3.0–12.0)
Neutro Abs: 4.4 10*3/uL (ref 1.4–7.7)
Neutrophils Relative %: 66.9 % (ref 43.0–77.0)
Platelets: 265 10*3/uL (ref 150.0–400.0)
RBC: 4.66 Mil/uL (ref 3.87–5.11)
RDW: 13.3 % (ref 11.5–15.5)
WBC: 6.6 10*3/uL (ref 4.0–10.5)

## 2020-10-23 LAB — URIC ACID: Uric Acid, Serum: 6.4 mg/dL (ref 2.4–7.0)

## 2020-10-23 LAB — COMPREHENSIVE METABOLIC PANEL
ALT: 15 U/L (ref 0–35)
AST: 13 U/L (ref 0–37)
Albumin: 4.3 g/dL (ref 3.5–5.2)
Alkaline Phosphatase: 72 U/L (ref 39–117)
BUN: 16 mg/dL (ref 6–23)
CO2: 30 mEq/L (ref 19–32)
Calcium: 9.6 mg/dL (ref 8.4–10.5)
Chloride: 103 mEq/L (ref 96–112)
Creatinine, Ser: 1.01 mg/dL (ref 0.40–1.20)
GFR: 57.76 mL/min — ABNORMAL LOW (ref >60.00)
Glucose, Bld: 97 mg/dL (ref 70–99)
Potassium: 4 mEq/L (ref 3.5–5.1)
Sodium: 138 mEq/L (ref 135–145)
Total Bilirubin: 0.7 mg/dL (ref 0.2–1.2)
Total Protein: 6.7 g/dL (ref 6.0–8.3)

## 2020-10-23 LAB — C-REACTIVE PROTEIN: CRP: <1 mg/dL (ref 0.5–20.0)

## 2020-10-23 LAB — SEDIMENTATION RATE: Sed Rate: 6 mm/hr (ref 0–30)

## 2020-10-23 LAB — TSH: TSH: 0.92 u[IU]/mL (ref 0.35–4.50)

## 2020-10-23 MED ORDER — MELOXICAM 7.5 MG PO TABS: 7.5000 mg | ORAL_TABLET | Freq: Every day | ORAL | 0 refills | Status: DC

## 2020-10-23 NOTE — Assessment & Plan Note (Signed)
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  Patient is going to consider the possibility of surgical intervention in about 1 year but at the moment would like something else.  Patient will consider the possibility of injections but would like to do it before her trip.  Patient has responded previously to prednisone but we discussed the oral prednisone we do not want to do it very regularly at this time.  Encourage patient to continue to work on her weight loss.  Patient will follow up again in 4 weeks.  Total time with patient on today with this and reviewing patient's labs and discussing the meloxicam versus the prednisone 36 min

## 2020-10-23 NOTE — Progress Notes (Signed)
Natasha [name] [address] [place] Natasha [name] Phone: [phone] Subjective:   I Natasha [name] am serving as a Education administrator for Dr. Hulan Saas.  This visit occurred during the SARS-CoV-2 public health emergency.  Safety protocols were in place, including screening questions prior to the visit, additional usage of staff PPE, and extensive cleaning of exam room while observing appropriate contact time as indicated for disinfecting solutions.   I'm seeing this patient by the request  of:  Panosh, Standley Brooking, MD  CC: Right knee pain  PPI:[id number]   09/08/2020 Known significant arthritic changes previously.  We will repeat x-rays today.  Patient also has what appears to be a meniscal injury.  Discussed the potential for injection but at this time withhold follow-up encourage weight loss that I think will be beneficial, topical anti-inflammatories, patient will follow up again in 6 weeks.  Patient is to be going to [employer] in December and would like to be able to walk a little better.  Due to the abnormal thigh to calf ratio in the instability of the joint I do feel an only stability brace with a Tru pull lite would be beneficial.  Follow-up again 8 to 10 weeks.  Update 10/23/2020 Natasha [name] is a 69 y.o. female coming in with complaint of right knee pain. Patient states she went to [place] last week and took the prednisone. Wants to know how often she can take it. States the knee still hurts. States all her bones hurt (joint pain).  Patient states when she takes her prednisone feels significantly better.  Does not want to do it irregularly and understands that but does want some improvement in some of her pain.       Past Medical History:  Diagnosis Date  . Allergic rhinitis   . Normal cardiac stress test    Past Surgical History:  Procedure Laterality Date  . MYRINGOTOMY     right 7 th grade   Social History   Socioeconomic History  . Marital  status: Married    Spouse name: Not on file  . Number of children: Not on file  . Years of education: Not on file  . Highest education level: Not on file  Occupational History  . Not on file  Tobacco Use  . Smoking status: Never Smoker  . Smokeless tobacco: Never Used  Vaping Use  . Vaping Use: Never used  Substance and Sexual Activity  . Alcohol use: Yes    Alcohol/week: 1.0 standard drink    Types: 1 Standard drinks or equivalent per week    Comment: 1 every 2 weeks   . Drug use: No  . Sexual activity: Not on file  Other Topics Concern  . 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    No tob   Social Determinants of Health   Financial Resource Strain:   . Difficulty of Paying Living Expenses: Not on file  Food Insecurity:   . Worried About Charity fundraiser in the Last Year: Not on file  . Ran Out of Food in the Last Year: Not on file  Transportation Needs:   . Lack of Transportation (Medical): Not on file  . Lack of Transportation (Non-Medical): Not on file  Physical Activity:   . Days of  Exercise per Week: Not on file  . Minutes of Exercise per Session: Not on file  Stress:   . Feeling of Stress : Not on file  Social Connections:   . Frequency of Communication with Friends and Family: Not on file  . Frequency of Social Gatherings with Friends and Family: Not on file  . Attends Religious Services: Not on file  . Active Member of Clubs or Organizations: Not on file  . Attends Archivist Meetings: Not on file  . Marital Status: Not on file   No Known Allergies Family History  Problem Relation Age of Onset  . Lung disease Father   . Cancer Father        Colon  . Colon polyps Father   . Multiple sclerosis Mother   . Scleroderma Sister   . Parkinson's disease Maternal Grandmother   . Colon cancer Neg Hx   . Breast cancer Neg  Hx     Current Outpatient Medications (Endocrine & Metabolic):  .  predniSONE (DELTASONE) 20 MG tablet, Take 3,3,3,2,2,2,1,1,1, 1/.2 1./2 1/.2 pills qd    Current Outpatient Medications (Analgesics):  .  meloxicam (MOBIC) 7.5 MG tablet, Take 1 tablet (7.5 mg total) by mouth daily.   Current Outpatient Medications (Other):  .  azithromycin (ZITHROMAX) 500 MG tablet, Take 500 mg by mouth daily.  .  chlorhexidine (PERIDEX) 0.12 % solution,    Reviewed prior external information including notes and imaging from  primary care provider As well as notes that were available from care everywhere and other healthcare systems.  Past medical history, social, surgical and family history all reviewed in electronic medical record.  No pertanent information unless stated regarding to the chief complaint.   Review of Systems:  No headache, visual changes, nausea, vomiting, diarrhea, constipation, dizziness, abdominal pain, skin rash, fevers, chills, night sweats, weight loss, swollen lymph nodes,  joint swelling, chest pain, shortness of breath, mood changes. POSITIVE muscle aches, body aches  Objective  Blood pressure (!) 148/90, pulse 85, height 5\' 6"  (1.676 m), SpO2 97 %.   General: No apparent distress alert and oriented x3 mood and affect normal, dressed appropriately.  HEENT: Pupils equal, extraocular movements intact  Respiratory: Patient's speak in full sentences and does not appear short of breath  Cardiovascular: Trace lower extremity edema, non tender, no erythema  Severely antalgic gait Knee: Bilateral valgus deformity noted. Large thigh to calf ratio.  Tender to palpation over medial and PF joint line.  Right greater than left ROM full in flexion and extension and lower leg rotation. instability with valgus force.  Right greater than left painful patellar compression.  Right greater than left Patellar glide with moderate crepitus. Patellar and quadriceps tendons  unremarkable. Hamstring and quadriceps strength is normal.     Impression and Recommendations:     The above documentation has been reviewed and is accurate and complete Lyndal Pulley, DO

## 2020-10-23 NOTE — Patient Instructions (Addendum)
Good to see you  Ice 20 minutes 2 times daily. Usually after activity and before bed. We will do injection on the knee maybe on the 14th (use maybe 745 slot) meloxicam daily for 5-7 days at a time. Do not take another anti-inflammatory  We will plan for the knee replacement maybe next year Happy holidays!

## 2020-10-24 LAB — PTH, INTACT AND CALCIUM
Calcium: 9.9 mg/dL (ref 8.6–10.4)
PTH: 32 pg/mL (ref 14–64)

## 2020-10-24 LAB — CALCIUM, IONIZED: Calcium, Ion: 5.1 mg/dL (ref 4.8–5.6)

## 2020-10-24 LAB — CYCLIC CITRUL PEPTIDE ANTIBODY, IGG: Cyclic Citrullin Peptide Ab: 21 UNITS — ABNORMAL HIGH

## 2020-10-24 LAB — RHEUMATOID FACTOR: Rheumatoid fact SerPl-aCnc: <14 IU/mL (ref <14)

## 2020-10-24 LAB — ANA: Anti Nuclear Antibody (ANA): NEGATIVE

## 2020-10-24 LAB — ANGIOTENSIN CONVERTING ENZYME: Angiotensin-Converting Enzyme: 13 U/L (ref 9–67)

## 2020-10-29 ENCOUNTER — Other Ambulatory Visit: Payer: Self-pay | Admitting: Family Medicine

## 2020-11-15 ENCOUNTER — Other Ambulatory Visit: Payer: Self-pay | Admitting: Family Medicine

## 2020-11-18 ENCOUNTER — Encounter: Payer: Self-pay | Admitting: Family Medicine

## 2020-11-18 ENCOUNTER — Other Ambulatory Visit: Payer: Self-pay

## 2020-11-18 ENCOUNTER — Ambulatory Visit (INDEPENDENT_AMBULATORY_CARE_PROVIDER_SITE_OTHER): Payer: Medicare Other | Admitting: Family Medicine

## 2020-11-18 DIAGNOSIS — M170 Bilateral primary osteoarthritis of knee: Secondary | ICD-10-CM | POA: Diagnosis not present

## 2020-11-18 NOTE — Assessment & Plan Note (Signed)
Patient given injection and tolerated the procedure well.  Discussed icing regimen and home exercise, discussed avoiding certain activities.  Increase icing regimen.  Patient will follow up with me again in 6 to 8 weeks to make sure she is doing relatively better.  Understands she will need surgical intervention at some point

## 2020-11-18 NOTE — Progress Notes (Signed)
[place] Phone: [phone] Subjective:   [name], am serving as a scribe for Dr. Hulan Saas. This visit occurred during the SARS-CoV-2 public health emergency.  Safety protocols were in place, including screening questions prior to the visit, additional usage of staff PPE, and extensive cleaning of exam room while observing appropriate contact time as indicated for disinfecting solutions.   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  Patient is going to consider the possibility of surgical intervention in about 1 year but at the moment would like something else.  Patient will consider the possibility of injections but would like to do it before her trip.  Patient has responded previously to prednisone but we discussed the oral prednisone we do not want to do it very regularly at this time.  Encourage patient to continue to work on her weight loss.  Patient will follow up again in 4 weeks.  Total time with patient on today with this and reviewing patient's labs and discussing the meloxicam versus the prednisone 36 min  Update 11/18/2020 QUENESHA DOUGLASS is a 69 y.o. female coming in with complaint of right knee pain. Patient here for Monovisc injection.  Patient continues to have some discomfort and pain as well.  Patient wants to avoid any type of surgical intervention for about a year if possible.  Patient continues to have some instability.       Past Medical History:  Diagnosis Date  . Allergic rhinitis   . Normal cardiac stress test    Past Surgical History:  Procedure Laterality Date  . MYRINGOTOMY     right 7 th grade   Social History   Socioeconomic History  . Marital status: Married    Spouse name: Not on file  . Number of children: Not on file  .  Years of education: Not on file  . Highest education level: Not on file  Occupational History  . Not on file  Tobacco Use  . Smoking status: Never Smoker  . Smokeless tobacco: Never Used  Vaping Use  . Vaping Use: Never used  Substance and Sexual Activity  . Alcohol use: Yes    Alcohol/week: 1.0 standard drink    Types: 1 Standard drinks or equivalent per week    Comment: 1 every 2 weeks   . Drug use: No  . Sexual activity: Not on file  Other Topics Concern  . 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    No tob   Social Determinants of Radio broadcast assistant Strain: Not on file  Food Insecurity: Not on file  Transportation Needs: Not on file  Physical Activity: Not on file  Stress: Not on file  Social Connections: Not on file   No Known Allergies Family History  Problem Relation Age of Onset  . Lung disease Father   . Cancer Father        Colon  . Colon polyps Father   . Multiple sclerosis Mother   . Scleroderma Sister   . Parkinson's disease Maternal Grandmother   . Colon cancer Neg Hx   .  Breast cancer Neg Hx     Current Outpatient Medications (Endocrine & Metabolic):  .  predniSONE (DELTASONE) 20 MG tablet, Take 3,3,3,2,2,2,1,1,1, 1/.2 1./2 1/.2 pills qd    Current Outpatient Medications (Analgesics):  .  meloxicam (MOBIC) 7.5 MG tablet, TAKE 1 TABLET BY MOUTH EVERY DAY   Current Outpatient Medications (Other):  .  azithromycin (ZITHROMAX) 500 MG tablet, Take 500 mg by mouth daily.  .  chlorhexidine (PERIDEX) 0.12 % solution,    Reviewed prior external information including notes and imaging from  primary care provider As well as notes that were available from care everywhere and other healthcare systems.  Past medical history, social, surgical and family history all reviewed in electronic medical record.  No pertanent  information unless stated regarding to the chief complaint.   Review of Systems:  No headache, visual changes, nausea, vomiting, diarrhea, constipation, dizziness, abdominal pain, skin rash, fevers, chills, night sweats, weight loss, swollen lymph nodes, body aches, joint swelling, chest pain, shortness of breath, mood changes. POSITIVE muscle aches  Objective  Blood pressure 116/78, pulse 84, height 5\' 6"  (1.676 m), weight 250 lb (113.4 kg), SpO2 98 %.   General: No apparent distress alert and oriented x3 mood and affect normal, dressed appropriately.  HEENT: Pupils equal, extraocular movements intact  Respiratory: Patient's speak in full sentences and does not appear short of breath  Cardiovascular: No lower extremity edema, non tender, no erythema  Antalgic gait noted.  Knee: Right valgus deformity noted. Large thigh to calf ratio.  Tender to palpation over medial and PF joint line.  ROM full in flexion and extension and lower leg rotation. instability with valgus force.  painful patellar compression. Patellar glide with moderate crepitus. Patellar and quadriceps tendons unremarkable. Hamstring and quadriceps strength is normal. Contralateral knee shows arthritic changes as well but nontender  After informed written and verbal consent, patient was seated on exam table. Right knee was prepped with alcohol swab and utilizing anterolateral approach, had difficulty with getting in and on the first try and had to repeat then patient's right knee space was injected with 48 mg per 3 mL of Monovisc (sodium hyaluronate) in a prefilled syringe was injected easily into the knee through a 22-gauge needle..Patient tolerated the procedure well without immediate complications.   Impression and Recommendations:     The above documentation has been reviewed and is accurate and complete Lyndal Pulley, DO

## 2020-11-18 NOTE — Patient Instructions (Addendum)
Gel injection right knee today See me again in 8 weeks

## 2020-12-16 ENCOUNTER — Telehealth: Payer: Self-pay | Admitting: Internal Medicine

## 2020-12-16 NOTE — Telephone Encounter (Signed)
Left message for patient to call back and schedule Medicare Annual Wellness Visit (AWV) either virtually or in office.   Last AWV no information please schedule at anytime with [facility] Nurse Health Advisor 1 or 2   This should be a 45 minute visit. 

## 2020-12-30 ENCOUNTER — Ambulatory Visit (INDEPENDENT_AMBULATORY_CARE_PROVIDER_SITE_OTHER): Payer: Medicare HMO | Admitting: Family Medicine

## 2020-12-30 ENCOUNTER — Other Ambulatory Visit: Payer: Self-pay

## 2020-12-30 ENCOUNTER — Encounter: Payer: Self-pay | Admitting: Family Medicine

## 2020-12-30 DIAGNOSIS — M170 Bilateral primary osteoarthritis of knee: Secondary | ICD-10-CM | POA: Diagnosis not present

## 2020-12-30 NOTE — Assessment & Plan Note (Addendum)
Patient has failed all conservative therapy at this time.  Continues to have discomfort and pain.  Feels like the injection did not help out significantly.  We discussed the only thing left would be a surgical intervention.  Patient will be referred today to further evaluate.  Patient will be referred to Dr. Ninfa Linden to discuss the potential for surgical intervention.  We did encourage patient to continue to work on weight loss prior to this.  Discussed with patient for greater than 31 minutes with this as well as reviewing patient's chart today.

## 2020-12-30 NOTE — Progress Notes (Signed)
[place] [address] [place] Phone: [phone] Subjective:   [name] am serving as a Education administrator for Dr. Hulan Saas.  This visit occurred during the SARS-CoV-2 public health emergency.  Safety protocols were in place, including screening questions prior to the visit, additional usage of staff PPE, and extensive cleaning of exam room while observing appropriate contact time as indicated for disinfecting solutions.   I'm seeing this patient by the request  of:  Panosh, Standley Brooking, MD  CC: Knee pain follow-up  OIZ:[id number]   11/18/2020 Patient given injection and tolerated the procedure well.  Discussed icing regimen and home exercise, discussed avoiding certain activities.  Increase icing regimen.  Patient will follow up with me again in 6 to 8 weeks to make sure she is doing relatively better.  Understands she will need surgical intervention at some point  Update 12/30/2020 Natasha [name] is a 70 y.o. female coming in with complaint of bilateral knee pain. States she is making no progress. Practically incapacitated. Injections did nothing last visit. States she wants to talk about a long term plan. States that steroids are the only thing that helps.  Patient states that it is affecting all daily activities.  Can only walk approximately 100 feet without some discomfort and pain.  Patient is looking for improvement in quality of life.  Patient feels that the knees do increase some of her back and hip pain as well.    Patient given a Monovisc injection November 18, 2020  Past Medical History:  Diagnosis Date  . Allergic rhinitis   . Normal cardiac stress test    Past Surgical History:  Procedure Laterality Date  . MYRINGOTOMY     right 7 th grade   Social History   Socioeconomic History  . Marital status: Married    Spouse name: Not on file  . Number of children: Not on file  . Years of education: Not on file  . Highest  education level: Not on file  Occupational History  . Not on file  Tobacco Use  . Smoking status: Never Smoker  . Smokeless tobacco: Never Used  Vaping Use  . Vaping Use: Never used  Substance and Sexual Activity  . Alcohol use: Yes    Alcohol/week: 1.0 standard drink    Types: 1 Standard drinks or equivalent per week    Comment: 1 every 2 weeks   . Drug use: No  . Sexual activity: Not on file  Other Topics Concern  . 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    No tob   Social Determinants of Radio broadcast assistant Strain: Not on file  Food Insecurity: Not on file  Transportation Needs: Not on file  Physical Activity: Not on file  Stress: Not on file  Social Connections: Not on file   No Known Allergies Family History  Problem Relation Age of Onset  . Lung disease Father   . Cancer Father        Colon  . Colon polyps Father   . Multiple sclerosis Mother   . Scleroderma Sister   . Parkinson's disease Maternal Grandmother   . Colon cancer Neg Hx   . Breast cancer Neg Hx     Current Outpatient Medications (Endocrine &  Metabolic):  .  predniSONE (DELTASONE) 20 MG tablet, Take 3,3,3,2,2,2,1,1,1, 1/.2 1./2 1/.2 pills qd    Current Outpatient Medications (Analgesics):  .  meloxicam (MOBIC) 7.5 MG tablet, TAKE 1 TABLET BY MOUTH EVERY DAY   Current Outpatient Medications (Other):  .  azithromycin (ZITHROMAX) 500 MG tablet, Take 500 mg by mouth daily.  .  chlorhexidine (PERIDEX) 0.12 % solution,    Reviewed prior external information including notes and imaging from  primary care provider As well as notes that were available from care everywhere and other healthcare systems.  Past medical history, social, surgical and family history all reviewed in electronic medical record.  No pertanent information unless stated regarding to the  chief complaint.   Review of Systems:  No headache, visual changes, nausea, vomiting, diarrhea, constipation, dizziness, abdominal pain, skin rash, fevers, chills, night sweats, weight loss, swollen lymph nodes, , joint swelling, chest pain, shortness of breath, mood changes. POSITIVE muscle aches, body aches  Objective  Blood pressure 130/88, pulse 69, height 5\' 6"  (1.676 m), SpO2 98 %.   General: No apparent distress alert and oriented x3 mood and affect normal, dressed appropriately.  HEENT: Pupils equal, extraocular movements intact  Respiratory: Patient's speak in full sentences and does not appear short of breath  Cardiovascular: Trace lower extremity edema, non tender, no erythema  Gait antalgic gait Knee: Bilateral valgus deformity noted.  Abnormal thigh to calf ratio.  Tender to palpation over medial and PF joint line.  Right greater than left ROM full in flexion and extension and lower leg rotation.  Does have pain noted with full extension on the right instability with valgus force.  painful patellar compression. Patellar glide with moderate crepitus. Patellar and quadriceps tendons unremarkable. Hamstring and quadriceps strength is normal.  Impression and Recommendations:     The above documentation has been reviewed and is accurate and complete Lyndal Pulley, DO

## 2020-12-30 NOTE — Patient Instructions (Addendum)
Good to see you Referral to Dr. Rush Farmer to discuss replacement At this point I think we have done all conservative therapy I am here If you have questions otherwise

## 2021-01-07 ENCOUNTER — Ambulatory Visit (INDEPENDENT_AMBULATORY_CARE_PROVIDER_SITE_OTHER): Payer: Medicare HMO | Admitting: Orthopaedic Surgery

## 2021-01-07 VITALS — Ht 66.0 in | Wt 250.0 lb

## 2021-01-07 DIAGNOSIS — M1711 Unilateral primary osteoarthritis, right knee: Secondary | ICD-10-CM | POA: Diagnosis not present

## 2021-01-07 NOTE — Progress Notes (Signed)
Office Visit Note   Patient: Natasha [name]           Date of Birth: 1951-11-08           MRN: [id number] Visit Date: 01/07/2021              Requested by: Lyndal Pulley, DO [place],  [place] [id number] PCP: Burnis Medin, MD   Assessment & Plan: Visit Diagnoses:  1. Unilateral primary osteoarthritis, right knee     Plan: We did talk in length in detail about knee replacement surgery.  I went over her x-rays and her clinical exam findings with her.  I am comfortable with proceeding with the surgery given the fact that she is an avid swimmer and continue to lose weight.  Based on examining her knee I also feel comfortable with proceeding with the surgery.  After I showed her knee replacement model explained in detail what her interoperative and postoperative course involves.  We talked about the risk and benefits of surgery as well.  She will be moving to an independent living area in the fall that has good therapy services and can also accommodate her from a skilled nursing standpoint if needed.  She is decided she would like to wait until the fall before proceeding with knee replacement surgery.  I did give her my card and my surgery scheduler's card.  I am happy to take care of her even sooner if things change for her.  All questions and concerns were answered and addressed.  She is very pleasant individual and a good discussion.  Follow-Up Instructions: Return if symptoms worsen or fail to improve.   Orders:  No orders of the defined types were placed in this encounter.  No orders of the defined types were placed in this encounter.     Procedures: No procedures performed   Clinical Data: No additional findings.   Subjective: Chief Complaint  Patient presents with  . Left Knee - Pain  . Right Knee - Pain  Patient is a very pleasant 70 year old female sent from [facility] to evaluate and treat end-stage tricompartment arthritis of her right knee.   She has been through conservative treatment for over a year including steroids.  She is worked on activity modification and even weight loss.  Her last BMI was 40.35 with a weight of 250.  She says she is down to 240 now.  She is not a diabetic.  Her knee pain is daily and is detriment affecting her mobility, her quality of life and her actives of daily living.  It can be 10 out of 10 at times.  It is worse at night and does wake her up from sleep.  She is interested in knee replacement surgery.  She has never had surgery on that knee before.  Her left knee hurts some but not like her right knee.  HPI  Review of Systems She currently denies any headache, chest pain, shortness of breath, fever, chills, nausea, vomiting  Objective: Vital Signs: Ht 5\' 6"  (1.676 m)   Wt 250 lb (113.4 kg)   BMI 40.35 kg/m   Physical Exam She is alert and orient x3 and in no acute distress Ortho Exam Examination of her right knee shows that she lacks full extension by just a few degrees.  She can flex well but is very painful.  The knee has varus malalignment.  There is significant patellofemoral crepitation and medial joint line tenderness  throughout the extension arc to flexion arc of the knee. Specialty Comments:  No specialty comments available.  Imaging: No results found. X-rays on the canopy system independently reviewed of the right knee show severe end-stage arthritis.  There is varus malalignment.  There is almost bone-on-bone wear in the medial compartment and there is bone-on-bone wear of the patellofemoral joint.  There are large osteophytes in all 3 compartments.  PMFS History: Patient Active Problem List   Diagnosis Date Noted  . Unilateral primary osteoarthritis, right knee 01/07/2021  . Primary osteoarthritis of both knees 11/07/2018  . Spinal stenosis of lumbar region 01/25/2018  . Right lumbar radiculopathy 08/10/2017  . Vitamin B 12 deficiency 09/06/2016  . Routine general medical  examination at a health care facility 10/02/2012  . Routine gynecological examination 10/02/2012  . Family history of colon cancer 10/02/2012  . HYPERLIPIDEMIA 08/06/2008  . OBESITY 08/06/2008  . ALLERGIC RHINITIS 08/06/2008  . LOW HDL 04/11/2008  . MITRAL VALVE PROLAPSE 04/11/2008   Past Medical History:  Diagnosis Date  . Allergic rhinitis   . Normal cardiac stress test     Family History  Problem Relation Age of Onset  . Lung disease Father   . Cancer Father        Colon  . Colon polyps Father   . Multiple sclerosis Mother   . Scleroderma Sister   . Parkinson's disease Maternal Grandmother   . Colon cancer Neg Hx   . Breast cancer Neg Hx     Past Surgical History:  Procedure Laterality Date  . MYRINGOTOMY     right 7 th grade   Social History   Occupational History  . Not on file  Tobacco Use  . Smoking status: Never Smoker  . Smokeless tobacco: Never Used  Vaping Use  . Vaping Use: Never used  Substance and Sexual Activity  . Alcohol use: Yes    Alcohol/week: 1.0 standard drink    Types: 1 Standard drinks or equivalent per week    Comment: 1 every 2 weeks   . Drug use: No  . Sexual activity: Not on file

## 2021-01-17 ENCOUNTER — Encounter: Payer: Self-pay | Admitting: Family Medicine

## 2021-01-19 MED ORDER — PREDNISONE 20 MG PO TABS
ORAL_TABLET | ORAL | 0 refills | Status: DC
Start: 2021-01-19 — End: 2021-03-10

## 2021-02-16 ENCOUNTER — Telehealth: Payer: Self-pay | Admitting: Internal Medicine

## 2021-02-16 NOTE — Telephone Encounter (Signed)
Left message for patient to call back and schedule Medicare Annual Wellness Visit (AWV) either virtually or in office. No detailed message left   AWVI  please schedule at anytime with [facility] Nurse Health Advisor 1 or 2   This should be a 45 minute visit. 

## 2021-02-20 ENCOUNTER — Telehealth: Payer: Self-pay | Admitting: Internal Medicine

## 2021-02-20 NOTE — Telephone Encounter (Signed)
The patient dropped of forms to be completed  Call patient for pickup at [phone]  Disposition: Dr's folder

## 2021-02-24 DIAGNOSIS — Z0279 Encounter for issue of other medical certificate: Secondary | ICD-10-CM

## 2021-02-25 NOTE — Telephone Encounter (Signed)
Form completed placed on desk   please copy for scanned original to patient.

## 2021-02-26 NOTE — Telephone Encounter (Signed)
Left a detailed message informing the patient that her forms have been completed and are ready for pickup at the front.

## 2021-02-27 ENCOUNTER — Encounter: Payer: Self-pay | Admitting: Family Medicine

## 2021-02-27 ENCOUNTER — Encounter: Payer: Self-pay | Admitting: Orthopaedic Surgery

## 2021-03-09 NOTE — Progress Notes (Signed)
[place] [facility] Phone: [phone] Subjective:   Natasha [name], am serving as a scribe for Dr. [facility]. This visit occurred during the SARS-CoV-2 public health emergency.  Safety protocols were in place, including screening questions prior to the visit, additional usage of staff PPE, and extensive cleaning of exam room while observing appropriate contact time as indicated for disinfecting solutions.   I'm seeing this patient by the request  of:  Panosh, Standley Brooking, MD  CC: Shoulder pain  [id number]   12/30/2020 Patient has failed all conservative therapy at this time.  Continues to have discomfort and pain.  Feels like the injection did not help out significantly.  We discussed the only thing left would be a surgical intervention.  Patient will be referred today to further evaluate.  Patient will be referred to Dr. Ninfa Linden to discuss the potential for surgical intervention.  We did encourage patient to continue to work on weight loss prior to this.  Discussed with patient for greater than 31 minutes with this as well as reviewing patient's chart today.  Update 03/10/2021 Natasha [name] is a 70 y.o. female coming in with complaint of R shoulder, B knee pain. R shoulder pain at night in anterior aspect. Pain is sharp. Able to swimming with achy pain.   Did speak with Dr. Rush Farmer about her shoulder. Shoulder pain increased the other night and Tylenol helped to alleviate her pain.   Patient is thinking she will have R knee replacement in October. Unsure if knee replacement is the answer to all of her pains as she has pain in hips and other knee as well.   Would like to discuss pain management. Patient is going on trip the end of June and middle of September and would like prednisone.        Past Medical History:  Diagnosis Date  . Allergic rhinitis   . Normal cardiac stress test    Past Surgical History:   Procedure Laterality Date  . MYRINGOTOMY     right 7 th grade   Social History   Socioeconomic History  . Marital status: Married    Spouse name: Not on file  . Number of children: Not on file  . Years of education: Not on file  . Highest education level: Not on file  Occupational History  . Not on file  Tobacco Use  . Smoking status: Never Smoker  . Smokeless tobacco: Never Used  Vaping Use  . Vaping Use: Never used  Substance and Sexual Activity  . Alcohol use: Yes    Alcohol/week: 1.0 standard drink    Types: 1 Standard drinks or equivalent per week    Comment: 1 every 2 weeks   . Drug use: [name]  . Sexual activity: Not on file  Other Topics Concern  . 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    [name] tob   Social Determinants of Health   Financial Resource Strain: Low Risk   . Difficulty of Paying Living Expenses: Not hard at all  Food Insecurity: [name] Food Insecurity  . Worried About Charity fundraiser in the Last Year: Never true  . Ran Out of Food in the Last Year: Never true  Transportation Needs: [name] Transportation Needs  .  Lack of Transportation (Medical): [name]  . Lack of Transportation (Non-Medical): [name]  Physical Activity: Insufficiently Active  . Days of Exercise per Week: 2 days  . Minutes of Exercise per Session: 60 min  Stress: [name] Stress Concern Present  . Feeling of Stress : Not at all  Social Connections: Moderately Integrated  . Frequency of Communication with Friends and Family: More than three times a week  . Frequency of Social Gatherings with Friends and Family: More than three times a week  . Attends Religious Services: More than 4 times per year  . Active Member of Clubs or Organizations: [name]  . Attends Archivist Meetings: Never  . Marital Status: Married   [name] Known Allergies Family History  Problem Relation  Age of Onset  . Lung disease Father   . Cancer Father        Colon  . Colon polyps Father   . Multiple sclerosis Mother   . Scleroderma Sister   . Parkinson's disease Maternal Grandmother   . Colon cancer Neg Hx   . Breast cancer Neg Hx     Current Outpatient Medications (Endocrine & Metabolic):  .  predniSONE (DELTASONE) 20 MG tablet, Take 2 tablets (40 mg total) by mouth daily with breakfast.    Current Outpatient Medications (Analgesics):  .  meloxicam (MOBIC) 7.5 MG tablet, TAKE 1 TABLET BY MOUTH EVERY DAY   Current Outpatient Medications (Other):  .  azithromycin (ZITHROMAX) 500 MG tablet, Take 500 mg by mouth daily. .  chlorhexidine (PERIDEX) 0.12 % solution,    Reviewed prior external information including notes and imaging from  primary care provider As well as notes that were available from care everywhere and other healthcare systems.  Past medical history, social, surgical and family history all reviewed in electronic medical record.  [name] pertanent information unless stated regarding to the chief complaint.   Review of Systems:  [name] headache, visual changes, nausea, vomiting, diarrhea, constipation, dizziness, abdominal pain, skin rash, fevers, chills, night sweats, weight loss, swollen lymph nodes, body aches, joint swelling, chest pain, shortness of breath, mood changes. POSITIVE muscle aches  Objective  Blood pressure 120/82, pulse 76, height 5\' 6"  (1.676 m), weight 250 lb (113.4 kg), SpO2 98 %.   General: [name] apparent distress alert and oriented x3 mood and affect normal, dressed appropriately.  HEENT: Pupils equal, extraocular movements intact  Respiratory: Patient's speak in full sentences and does not appear short of breath  Cardiovascular: [name] lower extremity edema, non tender, [name] erythema  Gait antalgic Continue arthritic changes of the right knee.  Instability noted with valgus and varus force.  [name] significant crepitus noted of the patellofemoral  joint.  Right shoulder does have positive impingement with Hawkins but not as much with Neer's.  Patient's rotator cuff strength 5 out of 5 but does have pain with empty can.  Mild pain with O'Brien's but [name] pain with crossover test.  Limited musculoskeletal ultrasound was performed and interpreted by Lyndal Pulley  Limited ultrasound of patient's right shoulder shows the patient does have acromioclavicular narrowing noted with hypoechoic changes consistent with an effusion.  Patient also has at the subscapularis level what appears to be more of a bursitis noted on impingement view.  [name] true tear in this area.  Mild degenerative changes noted of the supraspinatus. Impression: Degenerative changes of the supraspinatus with [name] acute tear, acromioclavicular arthritis and acute subscapularis bursitis  [id number]; 15 additional minutes spent for Therapeutic exercises as stated in above  notes.  This included exercises focusing on stretching, strengthening, with significant focus on eccentric aspects.   Long term goals include an improvement in range of motion, strength, endurance as well as avoiding reinjury. Patient's frequency would include in 1-2 times a day, 3-5 times a week for a duration of 6-12 weeks. Shoulder Exercises that included:  Basic scapular stabilization to include adduction and depression of scapula Scaption, focusing on proper movement and good control Internal and External rotation utilizing a theraband, with elbow tucked at side entire time Rows with theraband    Proper technique shown and discussed handout in great detail with ATC.  All questions were discussed and answered.     Impression and Recommendations:     The above documentation has been reviewed and is accurate and complete Lyndal Pulley, DO

## 2021-03-09 NOTE — Progress Notes (Signed)
Subjective:   Natasha [name] is a 70 y.o. female who presents for Medicare Annual (Subsequent) preventive examination.  I connected with Melody Haver  today by telephone and verified that I am speaking with the correct person using two identifiers. Location patient: home Location provider: work Persons participating in the virtual visit: patient, provider.   I discussed the limitations, risks, security and privacy concerns of performing an evaluation and management service by telephone and the availability of in person appointments. I also discussed with the patient that there may be a patient responsible charge related to this service. The patient expressed understanding and verbally consented to this telephonic visit.    Interactive audio and video telecommunications were attempted between this provider and patient, however failed, due to patient having technical difficulties OR patient did not have access to video capability.  We continued and completed visit with audio only.     Review of Systems    N/A       Objective:    There were no vitals filed for this visit. There is no height or weight on file to calculate BMI.  Advanced Directives 05/03/2017 09/11/2014 09/11/2014  Does Patient Have a Medical Advance Directive? No No No  Would patient like information on creating a medical advance directive? No - Patient declined No - patient declined information No - patient declined information    Current Medications (verified) Outpatient Encounter Medications as of 03/10/2021  Medication Sig  . azithromycin (ZITHROMAX) 500 MG tablet Take 500 mg by mouth daily.   . chlorhexidine (PERIDEX) 0.12 % solution   . meloxicam (MOBIC) 7.5 MG tablet TAKE 1 TABLET BY MOUTH EVERY DAY  . predniSONE (DELTASONE) 20 MG tablet Take 3,3,3,2,2,2,1,1,1, 1/.2 1./2 1/.2 pills qd   No facility-administered encounter medications on file as of 03/10/2021.    Allergies (verified) Patient has no known  allergies.   History: Past Medical History:  Diagnosis Date  . Allergic rhinitis   . Normal cardiac stress test    Past Surgical History:  Procedure Laterality Date  . MYRINGOTOMY     right 7 th grade   Family History  Problem Relation Age of Onset  . Lung disease Father   . Cancer Father        Colon  . Colon polyps Father   . Multiple sclerosis Mother   . Scleroderma Sister   . Parkinson's disease Maternal Grandmother   . Colon cancer Neg Hx   . Breast cancer Neg Hx    Social History   Socioeconomic History  . Marital status: Married    Spouse name: Not on file  . Number of children: Not on file  . Years of education: Not on file  . Highest education level: Not on file  Occupational History  . Not on file  Tobacco Use  . Smoking status: Never Smoker  . Smokeless tobacco: Never Used  Vaping Use  . Vaping Use: Never used  Substance and Sexual Activity  . Alcohol use: Yes    Alcohol/week: 1.0 standard drink    Types: 1 Standard drinks or equivalent per week    Comment: 1 every 2 weeks   . Drug use: No  . Sexual activity: Not on file  Other Topics Concern  . 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  No tob   Social Determinants of Radio broadcast assistant Strain: Not on file  Food Insecurity: Not on file  Transportation Needs: Not on file  Physical Activity: Not on file  Stress: Not on file  Social Connections: Not on file    Tobacco Counseling Counseling given: Not Answered   Clinical Intake:                 Diabetic?no         Activities of Daily Living No flowsheet data found.  Patient Care Team: Burnis Medin, MD as PCP - General  Indicate any recent Medical Services you may have received from other than [facility] providers in the past year (date may be approximate).     Assessment:   This is  a routine wellness examination for [place].  Hearing/Vision screen No exam data present  Dietary issues and exercise activities discussed:    Goals   None    Depression Screen PHQ 2/9 Scores 01/04/2019 10/04/2013  PHQ - 2 Score 0 0    Fall Risk Fall Risk  10/04/2013  Falls in the past year? No    FALL RISK PREVENTION PERTAINING TO THE HOME:  Any stairs in or around the home? No  If so, are there any without handrails? Yes  Home free of loose throw rugs in walkways, pet beds, electrical cords, etc? Yes  Adequate lighting in your home to reduce risk of falls? Yes   ASSISTIVE DEVICES UTILIZED TO PREVENT FALLS:  Life alert? No  Use of a cane, walker or w/c? No  Grab bars in the bathroom? Yes  Shower chair or bench in shower? Yes  Elevated toilet seat or a handicapped toilet? Yes    Cognitive Function: Normal cognitive status assessed by direct observation by this Nurse Health Advisor. No abnormalities found.          Immunizations Immunization History  Administered Date(s) Administered  . Fluad Quad(high Dose 65+) 08/10/2019  . Hepatitis A, Adult 10/03/2018  . Influenza Split 09/05/2013  . Influenza Whole 10/13/2007, 09/18/2008, 09/23/2009, 10/06/2010  . Influenza, High Dose Seasonal PF 09/14/2018  . Influenza,inj,Quad PF,6+ Mos 07/29/2016, 09/02/2017  . Moderna Sars-Covid-2 Vaccination 01/05/2020, 02/06/2020  . Pneumococcal Conjugate-13 10/03/2018  . Td 04/11/2008, 10/03/2018  . Zoster Recombinat (Shingrix) 09/02/2017, 11/11/2017    TDAP status: Up to date  Flu Vaccine status: Up to date  Pneumococcal vaccine status: Up to date  Covid-19 vaccine status: Completed vaccines  Qualifies for Shingles Vaccine? Yes   Zostavax completed Yes   Shingrix Completed?: No.    Education has been provided regarding the importance of this vaccine. Patient has been advised to call insurance company to determine out of pocket expense if they have not yet received this  vaccine. Advised may also receive vaccine at local pharmacy or Health Dept. Verbalized acceptance and understanding.  Screening Tests Health Maintenance  Topic Date Due  . Hepatitis C Screening  Never done  . DEXA SCAN  Never done  . PNA vac Low Risk Adult (2 of 2 - PPSV23) 10/04/2019  . COVID-19 Vaccine (3 - Booster for Moderna series) 08/08/2020  . INFLUENZA VACCINE  07/06/2021  . MAMMOGRAM  03/21/2022  . COLONOSCOPY (Pts 45-3yrs Insurance coverage will need to be confirmed)  04/07/2025  . TETANUS/TDAP  10/03/2028  . HPV VACCINES  Aged Out    Health Maintenance  Health Maintenance Due  Topic Date Due  . Hepatitis C Screening  Never done  .  DEXA SCAN  Never done  . PNA vac Low Risk Adult (2 of 2 - PPSV23) 10/04/2019  . COVID-19 Vaccine (3 - Booster for Moderna series) 08/08/2020    Colorectal cancer screening: Type of screening: Colonoscopy. Completed 05/032016. Repeat every 10 years  Mammogram status: Ordered 03/10/2021. Pt provided with contact info and advised to call to schedule appt.   Bone Density status: Ordered 03/10/2021. Pt provided with contact info and advised to call to schedule appt.  Lung Cancer Screening: (Low Dose CT Chest recommended if Age 35-80 years, 30 pack-year currently smoking OR have quit w/in 15years.) does not qualify.   Lung Cancer Screening Referral: N/A  Additional Screening:  Hepatitis C Screening: does qualify  Vision Screening: Recommended annual ophthalmology exams for early detection of glaucoma and other disorders of the eye. Is the patient up to date with their annual eye exam?  Yes  Who is the provider or what is the name of the office in which the patient attends annual eye exams? Dr.[email or web address] If pt is not established with a provider, would they like to be referred to a provider to establish care? No .   Dental Screening: Recommended annual dental exams for proper oral hygiene  Community Resource Referral / Chronic Care  Management: CRR required this visit?  No   CCM required this visit?  No      Plan:     I have personally reviewed and noted the following in the patient's chart:   . Medical and social history . Use of alcohol, tobacco or illicit drugs  . Current medications and supplements . Functional ability and status . Nutritional status . Physical activity . Advanced directives . List of other physicians . Hospitalizations, surgeries, and ER visits in previous 12 months . Vitals . Screenings to include cognitive, depression, and falls . Referrals and appointments  In addition, I have reviewed and discussed with patient certain preventive protocols, quality metrics, and best practice recommendations. A written personalized care plan for preventive services as well as general preventive health recommendations were provided to patient.     Randel Pigg, LPN   12/06/9145   Nurse Notes:none

## 2021-03-10 ENCOUNTER — Ambulatory Visit (INDEPENDENT_AMBULATORY_CARE_PROVIDER_SITE_OTHER): Payer: Medicare HMO

## 2021-03-10 ENCOUNTER — Ambulatory Visit: Payer: Medicare HMO | Admitting: Family Medicine

## 2021-03-10 ENCOUNTER — Other Ambulatory Visit: Payer: Self-pay

## 2021-03-10 ENCOUNTER — Ambulatory Visit: Payer: Self-pay

## 2021-03-10 ENCOUNTER — Encounter: Payer: Self-pay | Admitting: Family Medicine

## 2021-03-10 VITALS — BP 120/82 | HR 76 | Ht 66.0 in | Wt 250.0 lb

## 2021-03-10 DIAGNOSIS — Z1231 Encounter for screening mammogram for malignant neoplasm of breast: Secondary | ICD-10-CM

## 2021-03-10 DIAGNOSIS — M19019 Primary osteoarthritis, unspecified shoulder: Secondary | ICD-10-CM | POA: Insufficient documentation

## 2021-03-10 DIAGNOSIS — M170 Bilateral primary osteoarthritis of knee: Secondary | ICD-10-CM

## 2021-03-10 DIAGNOSIS — M25511 Pain in right shoulder: Secondary | ICD-10-CM | POA: Diagnosis not present

## 2021-03-10 DIAGNOSIS — M7551 Bursitis of right shoulder: Secondary | ICD-10-CM | POA: Insufficient documentation

## 2021-03-10 DIAGNOSIS — M19011 Primary osteoarthritis, right shoulder: Secondary | ICD-10-CM

## 2021-03-10 DIAGNOSIS — Z780 Asymptomatic menopausal state: Secondary | ICD-10-CM | POA: Diagnosis not present

## 2021-03-10 DIAGNOSIS — Z0000 Encounter for general adult medical examination without abnormal findings: Secondary | ICD-10-CM | POA: Diagnosis not present

## 2021-03-10 MED ORDER — PREDNISONE 20 MG PO TABS
40.0000 mg | ORAL_TABLET | Freq: Every day | ORAL | 0 refills | Status: DC
Start: 2021-03-10 — End: 2022-04-01

## 2021-03-10 NOTE — Patient Instructions (Addendum)
Great to see you  Exercises 3 times a week.  Keep hands within peripheral vision  Ice 20 minutes 2 times daily. Usually after activity and before bed. Use the prednisone in 5 day burst when needed but make sure no anti-inflammatory with it.  Xray at the front for shoulder See me again in 8 weeks to make sure you are better and ready for your trip

## 2021-03-10 NOTE — Assessment & Plan Note (Addendum)
Patient has known arthritic changes.  Discussed with patient in great length.  Patient noted does have severe arthritis and I do not think that such things as viscosupplementation would be significantly helpful.  Patient did not respond as well to the [place].  Could potentially consider PRP but do not think it would be a long-term benefit.  Follow-up with me again if she would like to consider the conservative approach.  Patient given a prescription for prednisone and will only take if necessary.  Knows not to take it with the meloxicam.  Patient will be traveling and would like to put off the knee replacement for quite some time still.

## 2021-03-10 NOTE — Assessment & Plan Note (Signed)
Appears to have more shoulder bursitis.  Patient has no significant weakness of the rotator cuff but on ultrasound there is some mild degenerative changes of the supraspinatus noted.  We discussed with patient about changing some range of motion exercises.  Worsening pain consider injection.  Does have some underlying acromioclavicular arthritis as well that could be contributing and will monitor.

## 2021-03-10 NOTE — Patient Instructions (Signed)
[name] , Thank you for taking time to come for your Medicare Wellness Visit. I appreciate your ongoing commitment to your health goals. Please review the following plan we discussed and let me know if I can assist you in the future.   Screening recommendations/referrals: Colonoscopy: Due 04/07/2025 Mammogram: ordered Bone Density: ordered Recommended yearly ophthalmology/optometry visit for glaucoma screening and checkup Recommended yearly dental visit for hygiene and checkup  Vaccinations: Influenza vaccine: Due Fall 2022 Pneumococcal vaccine: completed series  Tdap vaccine: completed 10/03/2018 Shingles vaccine: completed series   Advanced directives: will provided copies   Conditions/risks identified: none  Next appointment: none    Preventive Care 26 Years and Older, Female Preventive care refers to lifestyle choices and visits with your health care provider that can promote health and wellness. What does preventive care include?  A yearly physical exam. This is also called an annual well check.  Dental exams once or twice a year.  Routine eye exams. Ask your health care provider how often you should have your eyes checked.  Personal lifestyle choices, including:  Daily care of your teeth and gums.  Regular physical activity.  Eating a healthy diet.  Avoiding tobacco and drug use.  Limiting alcohol use.  Practicing safe sex.  Taking low-dose aspirin every day.  Taking vitamin and mineral supplements as recommended by your health care provider. What happens during an annual well check? The services and screenings done by your health care provider during your annual well check will depend on your age, overall health, lifestyle risk factors, and family history of disease. Counseling  Your health care provider may ask you questions about your:  Alcohol use.  Tobacco use.  Drug use.  Emotional well-being.  Home and relationship well-being.  Sexual  activity.  Eating habits.  History of falls.  Memory and ability to understand (cognition).  Work and work Statistician.  Reproductive health. Screening  You may have the following tests or measurements:  Height, weight, and BMI.  Blood pressure.  Lipid and cholesterol levels. These may be checked every 5 years, or more frequently if you are over 7 years old.  Skin check.  Lung cancer screening. You may have this screening every year starting at age 73 if you have a 30-pack-year history of smoking and currently smoke or have quit within the past 15 years.  Fecal occult blood test (FOBT) of the stool. You may have this test every year starting at age 26.  Flexible sigmoidoscopy or colonoscopy. You may have a sigmoidoscopy every 5 years or a colonoscopy every 10 years starting at age 25.  Hepatitis C blood test.  Hepatitis B blood test.  Sexually transmitted disease (STD) testing.  Diabetes screening. This is done by checking your blood sugar (glucose) after you have not eaten for a while (fasting). You may have this done every 1-3 years.  Bone density scan. This is done to screen for osteoporosis. You may have this done starting at age 58.  Mammogram. This may be done every 1-2 years. Talk to your health care provider about how often you should have regular mammograms. Talk with your health care provider about your test results, treatment options, and if necessary, the need for more tests. Vaccines  Your health care provider may recommend certain vaccines, such as:  Influenza vaccine. This is recommended every year.  Tetanus, diphtheria, and acellular pertussis (Tdap, Td) vaccine. You may need a Td booster every 10 years.  Zoster vaccine. You may need this  after age 57.  Pneumococcal 13-valent conjugate (PCV13) vaccine. One dose is recommended after age 36.  Pneumococcal polysaccharide (PPSV23) vaccine. One dose is recommended after age 44. Talk to your health care  provider about which screenings and vaccines you need and how often you need them. This information is not intended to replace advice given to you by your health care provider. Make sure you discuss any questions you have with your health care provider. Document Released: 12/19/2015 Document Revised: 08/11/2016 Document Reviewed: 09/23/2015 Elsevier Interactive Patient Education  2017 [place] Prevention in the Home Falls can cause injuries. They can happen to people of all ages. There are many things you can do to make your home safe and to help prevent falls. What can I do on the outside of my home?  Regularly fix the edges of walkways and driveways and fix any cracks.  Remove anything that might make you trip as you walk through a door, such as a raised step or threshold.  Trim any bushes or trees on the path to your home.  Use bright outdoor lighting.  Clear any walking paths of anything that might make someone trip, such as rocks or tools.  Regularly check to see if handrails are loose or broken. Make sure that both sides of any steps have handrails.  Any raised decks and porches should have guardrails on the edges.  Have any leaves, snow, or ice cleared regularly.  Use sand or salt on walking paths during winter.  Clean up any spills in your garage right away. This includes oil or grease spills. What can I do in the bathroom?  Use night lights.  Install grab bars by the toilet and in the tub and shower. Do not use towel bars as grab bars.  Use non-skid mats or decals in the tub or shower.  If you need to sit down in the shower, use a plastic, non-slip stool.  Keep the floor dry. Clean up any water that spills on the floor as soon as it happens.  Remove soap buildup in the tub or shower regularly.  Attach bath mats securely with double-sided non-slip rug tape.  Do not have throw rugs and other things on the floor that can make you trip. What can I do in  the bedroom?  Use night lights.  Make sure that you have a light by your bed that is easy to reach.  Do not use any sheets or blankets that are too big for your bed. They should not hang down onto the floor.  Have a firm chair that has side arms. You can use this for support while you get dressed.  Do not have throw rugs and other things on the floor that can make you trip. What can I do in the kitchen?  Clean up any spills right away.  Avoid walking on wet floors.  Keep items that you use a lot in easy-to-reach places.  If you need to reach something above you, use a strong step stool that has a grab bar.  Keep electrical cords out of the way.  Do not use floor polish or wax that makes floors slippery. If you must use wax, use non-skid floor wax.  Do not have throw rugs and other things on the floor that can make you trip. What can I do with my stairs?  Do not leave any items on the stairs.  Make sure that there are handrails on both sides of the  stairs and use them. Fix handrails that are broken or loose. Make sure that handrails are as long as the stairways.  Check any carpeting to make sure that it is firmly attached to the stairs. Fix any carpet that is loose or worn.  Avoid having throw rugs at the top or bottom of the stairs. If you do have throw rugs, attach them to the floor with carpet tape.  Make sure that you have a light switch at the top of the stairs and the bottom of the stairs. If you do not have them, ask someone to add them for you. What else can I do to help prevent falls?  Wear shoes that:  Do not have high heels.  Have rubber bottoms.  Are comfortable and fit you well.  Are closed at the toe. Do not wear sandals.  If you use a stepladder:  Make sure that it is fully opened. Do not climb a closed stepladder.  Make sure that both sides of the stepladder are locked into place.  Ask someone to hold it for you, if possible.  Clearly mark and  make sure that you can see:  Any grab bars or handrails.  First and last steps.  Where the edge of each step is.  Use tools that help you move around (mobility aids) if they are needed. These include:  Canes.  Walkers.  Scooters.  Crutches.  Turn on the lights when you go into a dark area. Replace any light bulbs as soon as they burn out.  Set up your furniture so you have a clear path. Avoid moving your furniture around.  If any of your floors are uneven, fix them.  If there are any pets around you, be aware of where they are.  Review your medicines with your doctor. Some medicines can make you feel dizzy. This can increase your chance of falling. Ask your doctor what other things that you can do to help prevent falls. This information is not intended to replace advice given to you by your health care provider. Make sure you discuss any questions you have with your health care provider. Document Released: 09/18/2009 Document Revised: 04/29/2016 Document Reviewed: 12/27/2014 Elsevier Interactive Patient Education  2017 [employer].

## 2021-03-12 ENCOUNTER — Encounter: Payer: Self-pay | Admitting: Internal Medicine

## 2021-05-05 NOTE — Progress Notes (Signed)
[facility] [address] [place] Phone: [phone] Subjective:   I Natasha [name] am serving as a Education administrator for Dr. Hulan Saas.  This visit occurred during the SARS-CoV-2 public health emergency.  Safety protocols were in place, including screening questions prior to the visit, additional usage of staff PPE, and extensive cleaning of exam room while observing appropriate contact time as indicated for disinfecting solutions.   I'm seeing this patient by the request  of:  Panosh, Standley Brooking, MD  CC: Knee pain and shoulder pain follow-up  [id number]   03/10/2021 Patient has known arthritic changes.  Discussed with patient in great length.  Patient noted does have severe arthritis and I do not think that such things as viscosupplementation would be significantly helpful.  Patient did not respond as well to the [place].  Could potentially consider PRP but do not think it would be a long-term benefit.  Follow-up with me again if she would like to consider the conservative approach.  Patient given a prescription for prednisone and will only take if necessary.  Knows not to take it with the meloxicam.  Patient will be traveling and would like to put off the knee replacement for quite some time still.  Appears to have more shoulder bursitis.  Patient has no significant weakness of the rotator cuff but on ultrasound there is some mild degenerative changes of the supraspinatus noted.  We discussed with patient about changing some range of motion exercises.  Worsening pain consider injection.  Does have some underlying acromioclavicular arthritis as well that could be contributing and will monitor.  Update 05/06/2021 Natasha [name] is a 70 y.o. female coming in with complaint of B knee pain and R shoulder pain. Patient states she is doing well. Wants knee replacement after high school reunion. 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 Knee has been doing  well with occasional 8 hour tylenol. Right shoulder sometimes wakes her up at night.  Patient states that during the day really seems to be doing relatively well.  Overall feeling relatively good.  Patient has lost weight and feels like that has been the most beneficial thing.       Past Medical History:  Diagnosis Date  . Allergic rhinitis   . Normal cardiac stress test    Past Surgical History:  Procedure Laterality Date  . MYRINGOTOMY     right 7 th grade   Social History   Socioeconomic History  . Marital status: Married    Spouse name: Not on file  . Number of children: Not on file  . Years of education: Not on file  . Highest education level: Not on file  Occupational History  . Not on file  Tobacco Use  . Smoking status: Never Smoker  . Smokeless tobacco: Never Used  Vaping Use  . Vaping Use: Never used  Substance and Sexual Activity  . Alcohol use: Yes    Alcohol/week: 1.0 standard drink    Types: 1 Standard drinks or equivalent per week    Comment: 1 every 2 weeks   . Drug use: No  . Sexual activity: Not on file  Other Topics Concern  . 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    No tob   Social Determinants  of Health   Financial Resource Strain: Low Risk   . Difficulty of Paying Living Expenses: Not hard at all  Food Insecurity: No Food Insecurity  . Worried About Charity fundraiser in the Last Year: Never true  . Ran Out of Food in the Last Year: Never true  Transportation Needs: No Transportation Needs  . Lack of Transportation (Medical): No  . Lack of Transportation (Non-Medical): No  Physical Activity: Insufficiently Active  . Days of Exercise per Week: 2 days  . Minutes of Exercise per Session: 60 min  Stress: No Stress Concern Present  . Feeling of Stress : Not at all  Social Connections: Moderately Integrated  .  Frequency of Communication with Friends and Family: More than three times a week  . Frequency of Social Gatherings with Friends and Family: More than three times a week  . Attends Religious Services: More than 4 times per year  . Active Member of Clubs or Organizations: No  . Attends Archivist Meetings: Never  . Marital Status: Married   No Known Allergies Family History  Problem Relation Age of Onset  . Lung disease Father   . Cancer Father        Colon  . Colon polyps Father   . Multiple sclerosis Mother   . Scleroderma Sister   . Parkinson's disease Maternal Grandmother   . Colon cancer Neg Hx   . Breast cancer Neg Hx     Current Outpatient Medications (Endocrine & Metabolic):  .  predniSONE (DELTASONE) 20 MG tablet, Take 2 tablets (40 mg total) by mouth daily with breakfast.    Current Outpatient Medications (Analgesics):  .  meloxicam (MOBIC) 7.5 MG tablet, TAKE 1 TABLET BY MOUTH EVERY DAY   Current Outpatient Medications (Other):  .  azithromycin (ZITHROMAX) 500 MG tablet, Take 500 mg by mouth daily. .  chlorhexidine (PERIDEX) 0.12 % solution,    Reviewed prior external information including notes and imaging from  primary care provider As well as notes that were available from care everywhere and other healthcare systems.  Past medical history, social, surgical and family history all reviewed in electronic medical record.  No pertanent information unless stated regarding to the chief complaint.   Review of Systems:  No headache, visual changes, nausea, vomiting, diarrhea, constipation, dizziness, abdominal pain, skin rash, fevers, chills, night sweats, weight loss, swollen lymph nodes, body aches, joint swelling, chest pain, shortness of breath, mood changes. POSITIVE muscle aches but improvement from previous exam  Objective  Blood pressure 120/78, pulse 67, height 5\' 6"  (1.676 m), weight 222 lb (100.7 kg), SpO2 98 %.   General: No apparent distress  alert and oriented x3 mood and affect normal, dressed appropriately.  Patient has lost considerable weight. HEENT: Pupils equal, extraocular movements intact  Respiratory: Patient's speak in full sentences and does not appear short of breath  Cardiovascular: No lower extremity edema, non tender, no erythema  Gait mild antalgic MSK:  Non tender with full range of motion and good stability and symmetric strength and tone of , elbows, wrist, hip, and ankles bilaterally.  Deferred knee inspection today.  Patient will right shoulder exam still shows some very mild positive impingement with Neer and Hawkins mild positive crossover noted.  Limited musculoskeletal ultrasound was performed and interpreted by Lyndal Pulley  Limited ultrasound of patient's right shoulder shows the patient does still have some hypoechoic changes noted of the acromioclavicular joint with moderate finalized area.  Patient does have  some mild degenerative changes of the supraspinatus but seems to be chronic Impression: Acromioclavicular arthritis with effusion.    Impression and Recommendations:     The above documentation has been reviewed and is accurate and complete Lyndal Pulley, DO

## 2021-05-06 ENCOUNTER — Other Ambulatory Visit: Payer: Self-pay

## 2021-05-06 ENCOUNTER — Ambulatory Visit: Payer: Medicare HMO | Admitting: Family Medicine

## 2021-05-06 ENCOUNTER — Ambulatory Visit: Payer: Self-pay

## 2021-05-06 ENCOUNTER — Encounter: Payer: Self-pay | Admitting: Family Medicine

## 2021-05-06 VITALS — BP 120/78 | HR 67 | Ht 66.0 in | Wt 222.0 lb

## 2021-05-06 DIAGNOSIS — M25511 Pain in right shoulder: Secondary | ICD-10-CM

## 2021-05-06 DIAGNOSIS — M19011 Primary osteoarthritis, right shoulder: Secondary | ICD-10-CM | POA: Diagnosis not present

## 2021-05-06 DIAGNOSIS — M170 Bilateral primary osteoarthritis of knee: Secondary | ICD-10-CM

## 2021-05-06 DIAGNOSIS — G8929 Other chronic pain: Secondary | ICD-10-CM | POA: Diagnosis not present

## 2021-05-06 NOTE — Assessment & Plan Note (Signed)
Patient continues to have arthritic changes with some effusion noted.  Patient was feeling better at this time.  We will make no significant changes in management.  Discussed if worsening pain we can consider injection but patient declined today.  We will try topical anti-inflammatories and follow-up with me as needed if she continues to improve

## 2021-05-06 NOTE — Patient Instructions (Addendum)
Good to see you Proud of you for the weight loss Try the pennsaid on the shoulder at night If continuing to do well see me when you need me Send me an update on mychart in a month

## 2021-05-06 NOTE — Assessment & Plan Note (Signed)
Patient is doing much better with the weight loss.  Encouraged her to continue to do so.  Follow-up with me as needed.  Patient can have replacement and has already been set up with another orthopedic surgeon.

## 2021-06-03 DIAGNOSIS — H524 Presbyopia: Secondary | ICD-10-CM | POA: Diagnosis not present

## 2021-06-16 ENCOUNTER — Other Ambulatory Visit: Payer: Self-pay

## 2021-06-16 ENCOUNTER — Ambulatory Visit
Admission: RE | Admit: 2021-06-16 | Discharge: 2021-06-16 | Disposition: A | Discharge status: Home / Self Care | Payer: Medicare HMO | Source: Ambulatory Visit | Attending: Internal Medicine | Admitting: Internal Medicine

## 2021-06-16 DIAGNOSIS — Z1231 Encounter for screening mammogram for malignant neoplasm of breast: Secondary | ICD-10-CM | POA: Diagnosis not present

## 2021-06-19 ENCOUNTER — Telehealth: Payer: Self-pay | Admitting: Internal Medicine

## 2021-06-19 ENCOUNTER — Other Ambulatory Visit: Payer: Self-pay | Admitting: Internal Medicine

## 2021-06-19 DIAGNOSIS — N6489 Other specified disorders of breast: Secondary | ICD-10-CM

## 2021-06-19 NOTE — Telephone Encounter (Signed)
Pt is calling in wanting to see if Dr. Regis Bill could get her in to another facility for a Diagnotic Follow-up Mammogram and US of the R breast.  Pt is currently going to [facility] and they are not able to get her in until the end of August and they advised her to call her PCP to see if she can get her scheduled at  a different facility.  Pt is wanting to see if it can be done for the month of July due to the pt will be travelling the month of August and want to have it done before she goes on her trip.

## 2021-06-22 NOTE — Telephone Encounter (Signed)
YES  please do

## 2021-06-22 NOTE — Telephone Encounter (Signed)
Orders have been faxed to [facility] per pt request.

## 2021-06-22 NOTE — Telephone Encounter (Signed)
Forms have been faxed to [facility] at [phone]

## 2021-06-24 DIAGNOSIS — R922 Inconclusive mammogram: Secondary | ICD-10-CM | POA: Diagnosis not present

## 2021-06-24 DIAGNOSIS — R928 Other abnormal and inconclusive findings on diagnostic imaging of breast: Secondary | ICD-10-CM | POA: Diagnosis not present

## 2021-07-30 ENCOUNTER — Other Ambulatory Visit: Payer: Medicare HMO

## 2021-09-11 ENCOUNTER — Ambulatory Visit
Admission: RE | Admit: 2021-09-11 | Discharge: 2021-09-11 | Disposition: A | Discharge status: Home / Self Care | Payer: Medicare HMO | Source: Ambulatory Visit | Attending: Internal Medicine | Admitting: Internal Medicine

## 2021-09-11 ENCOUNTER — Other Ambulatory Visit: Payer: Self-pay

## 2021-09-11 DIAGNOSIS — Z780 Asymptomatic menopausal state: Secondary | ICD-10-CM

## 2021-09-17 DIAGNOSIS — L821 Other seborrheic keratosis: Secondary | ICD-10-CM | POA: Diagnosis not present

## 2021-09-17 DIAGNOSIS — Z23 Encounter for immunization: Secondary | ICD-10-CM | POA: Diagnosis not present

## 2021-09-17 DIAGNOSIS — L578 Other skin changes due to chronic exposure to nonionizing radiation: Secondary | ICD-10-CM | POA: Diagnosis not present

## 2021-09-17 DIAGNOSIS — L814 Other melanin hyperpigmentation: Secondary | ICD-10-CM | POA: Diagnosis not present

## 2021-09-17 DIAGNOSIS — D225 Melanocytic nevi of trunk: Secondary | ICD-10-CM | POA: Diagnosis not present

## 2021-09-21 NOTE — Progress Notes (Signed)
Bone density is in the normal range.

## 2021-10-10 ENCOUNTER — Encounter: Payer: Self-pay | Admitting: Family Medicine

## 2021-10-16 NOTE — Telephone Encounter (Signed)
Patient's husband will be here on Tuesdsay and will pick up the handicap placard.

## 2021-10-20 NOTE — Telephone Encounter (Signed)
Placed a front desk for pickup.

## 2021-10-23 DIAGNOSIS — M722 Plantar fascial fibromatosis: Secondary | ICD-10-CM | POA: Diagnosis not present

## 2021-10-26 NOTE — Progress Notes (Signed)
Natasha [name] [place] Phone: [phone] Subjective:   [name], am serving as a scribe for Dr. Hulan Saas.  This visit occurred during the SARS-CoV-2 public health emergency.  Safety protocols were in place, including screening questions prior to the visit, additional usage of staff PPE, and extensive cleaning of exam room while observing appropriate contact time as indicated for disinfecting solutions.   I'm seeing this patient by the request  of:  Panosh, Standley Brooking, MD  CC: Right shoulder pain follow-up  [id number]  05/06/2021 Patient continues to have arthritic changes with some effusion noted.  Patient was feeling better at this time.  We will make no significant changes in management.  Discussed if worsening pain we can consider injection but patient declined today.  We will try topical anti-inflammatories and follow-up with me as needed if she continues to improve  Update 10/26/2021 Natasha [name] is a 70 y.o. female coming in with complaint of R shoulder pain.  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  Patient states that pain is waking her up at night. Pain over middle deltoid for past 3 months. No new injury to that area. Has not had injection in shoulder but has had success with knee injections.   Also having pain in 2nd toe on L foot. Notes pain and deviation of the toe for past 2 months. Does not recall injury.   Patient wants to revisit previous discussion about what she can do for arthritis management. Does not want to get knee replaced yet.        Past Medical History:  Diagnosis Date   Allergic rhinitis    Normal cardiac stress test    Past Surgical History:  Procedure Laterality Date   MYRINGOTOMY     right 7 th grade   Social History   Socioeconomic History   Marital status: Married    Spouse name:  Not on file   Number of children: Not on file   Years of education: Not on file   Highest education level: Not on file  Occupational History   Not on file  Tobacco Use   Smoking status: Never   Smokeless tobacco: Never  Vaping Use   Vaping Use: Never used  Substance and Sexual Activity   Alcohol use: Yes    Alcohol/week: 1.0 standard drink    Types: 1 Standard drinks or equivalent per week    Comment: 1 every 2 weeks    Drug use: No   Sexual activity: Not on file  Other Topics Concern   Not on file  Social History Narrative   HH  of 3   Son adopted from [place] now 55 has FAS and microcephaly   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    No tob   Social Determinants of Radio broadcast assistant Strain: Low Risk    Difficulty of Paying Living Expenses: Not hard at all  Food Insecurity: No Food Insecurity   Worried About Charity fundraiser in the Last Year: Never true   [place] in the Last Year: Never true  Transportation Needs: No Transportation Needs   Lack of Transportation (Medical): No   Lack of Transportation (Non-Medical): No  Physical Activity: Insufficiently Active   Days of Exercise per Week: 2 days   Minutes of  Exercise per Session: 60 min  Stress: No Stress Concern Present   Feeling of Stress : Not at all  Social Connections: Moderately Integrated   Frequency of Communication with Friends and Family: More than three times a week   Frequency of Social Gatherings with Friends and Family: More than three times a week   Attends Religious Services: More than 4 times per year   Active Member of [employer] or Organizations: No   Attends Music therapist: Never   Marital Status: Married   No Known Allergies Family History  Problem Relation Age of Onset   Lung disease Father    Cancer Father        Colon   Colon polyps Father    Multiple sclerosis Mother    Scleroderma Sister    Parkinson's disease Maternal Grandmother     Colon cancer Neg Hx    Breast cancer Neg Hx     Current Outpatient Medications (Endocrine & Metabolic):    predniSONE (DELTASONE) 20 MG tablet, Take 2 tablets (40 mg total) by mouth daily with breakfast.    Current Outpatient Medications (Analgesics):    meloxicam (MOBIC) 7.5 MG tablet, TAKE 1 TABLET BY MOUTH EVERY DAY   Current Outpatient Medications (Other):    azithromycin (ZITHROMAX) 500 MG tablet, Take 500 mg by mouth daily.   chlorhexidine (PERIDEX) 0.12 % solution,    Reviewed prior external information including notes and imaging from  primary care provider As well as notes that were available from care everywhere and other healthcare systems.  Past medical history, social, surgical and family history all reviewed in electronic medical record.  No pertanent information unless stated regarding to the chief complaint.   Review of Systems:  No headache, visual changes, nausea, vomiting, diarrhea, constipation, dizziness, abdominal pain, skin rash, fevers, chills, night sweats, weight loss, swollen lymph nodes, body aches, joint swelling, chest pain, shortness of breath, mood changes. POSITIVE muscle aches  Objective  Blood pressure 110/76, pulse 83, height 5\' 6"  (1.676 m), weight 222 lb (100.7 kg), SpO2 97 %.   General: No apparent distress alert and oriented x3 mood and affect normal, dressed appropriately.  HEENT: Pupils equal, extraocular movements intact  Respiratory: Patient's speak in full sentences and does not appear short of breath  Cardiovascular: No lower extremity edema, non tender, no erythema  Gait normal with good balance and coordination.  MSK:   Patient right shoulder exam shows positive impingement and positive crossover noted.  Rotator cuff strength does appear to be intact.  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  Procedure: Real-time Ultrasound Guided Injection of  right glenohumeral joint Device: GE Logiq Q7  Ultrasound guided injection is preferred based studies that show increased duration, increased effect, greater accuracy, decreased procedural pain, increased response rate with ultrasound guided versus blind injection.  Verbal informed consent obtained.  Time-out conducted.  Noted no overlying erythema, induration, or other signs of local infection.  Skin prepped in a sterile fashion.  Local anesthesia: Topical Ethyl chloride.  With sterile technique and under real time ultrasound guidance:  Joint visualized.  23g 1  inch needle inserted posterior approach. Pictures taken for needle placement. Patient did have injection of 2 cc of 1% lidocaine, 2 cc of 0.5% Marcaine, and 1.0 cc of Kenalog 40 mg/dL. Completed without difficulty  Pain immediately resolved suggesting accurate placement of the medication.  Advised to call if fevers/chills, erythema, induration,  drainage, or persistent bleeding.  Impression: Technically successful ultrasound guided injection.  Procedure: Real-time Ultrasound Guided Injection of right acromioclavicular joint with Device: GE Logiq Q7 Ultrasound guided injection is preferred based studies that show increased duration, increased effect, greater accuracy, decreased procedural pain, increased response rate, and decreased cost with ultrasound guided versus blind injection.  Verbal informed consent obtained.  Time-out conducted.  Noted no overlying erythema, induration, or other signs of local infection.  Skin prepped in a sterile fashion.  Local anesthesia: Topical Ethyl chloride.  With sterile technique and under real time ultrasound guidance: With a 25-gauge half inch needle injected with 0.5 cc of 0.5% Marcaine and 0.5 cc of Kenalog 40 mg/mL Completed without difficulty  Pain immediately resolved suggesting accurate placement of the medication.  Advised to call if fevers/chills, erythema, induration, drainage, or  persistent bleeding.  Impression: Technically successful ultrasound guided injection.    Impression and Recommendations:     The above documentation has been reviewed and is accurate and complete Lyndal Pulley, DO

## 2021-10-27 ENCOUNTER — Ambulatory Visit: Payer: Medicare HMO | Admitting: Family Medicine

## 2021-10-27 ENCOUNTER — Other Ambulatory Visit: Payer: Self-pay

## 2021-10-27 ENCOUNTER — Encounter: Payer: Self-pay | Admitting: Family Medicine

## 2021-10-27 ENCOUNTER — Ambulatory Visit: Payer: Self-pay

## 2021-10-27 VITALS — BP 110/76 | HR 83 | Ht 66.0 in | Wt 222.0 lb

## 2021-10-27 DIAGNOSIS — M19011 Primary osteoarthritis, right shoulder: Secondary | ICD-10-CM | POA: Diagnosis not present

## 2021-10-27 DIAGNOSIS — M7551 Bursitis of right shoulder: Secondary | ICD-10-CM | POA: Diagnosis not present

## 2021-10-27 DIAGNOSIS — M170 Bilateral primary osteoarthritis of knee: Secondary | ICD-10-CM | POA: Diagnosis not present

## 2021-10-27 DIAGNOSIS — M25511 Pain in right shoulder: Secondary | ICD-10-CM

## 2021-10-27 NOTE — Assessment & Plan Note (Signed)
Known arthritic changes.  Discussed with patient at great length about the potential need for injections or the possibility of viscosupplementation again.  Patient will consider this.  Wants to hold off on knee replacement at the moment.  Will refer patient to physical therapy to see how patient responds.  Follow-up with me again in 4 to 8 weeks.

## 2021-10-27 NOTE — Assessment & Plan Note (Signed)
Responded well to the injection with improvement in range of motion.  Does have meloxicam if necessary for breakthrough pain.  We will start with formal physical therapy.  Follow-up again in 6 to 8 weeks

## 2021-10-27 NOTE — Patient Instructions (Signed)
injected the shoulder in 2 places and I hope it helps a lot  Ice 20 minutes 2 times daily. Usually after activity and before bed. Keep hands within peripheral vision  [employer] orthotics "total support" online would be great  HOK[employer] recovery sandals would be good in the house.  See me again in 6-8 weeks

## 2021-10-27 NOTE — Assessment & Plan Note (Signed)
Injection given today, tolerated procedure well, discussed icing regimen and home exercises.  Increase activity slowly.  Hopefully this will be more beneficial.  X-rays are pending.

## 2021-10-28 ENCOUNTER — Ambulatory Visit (INDEPENDENT_AMBULATORY_CARE_PROVIDER_SITE_OTHER): Payer: Medicare HMO | Admitting: Internal Medicine

## 2021-10-28 ENCOUNTER — Encounter: Payer: Self-pay | Admitting: Internal Medicine

## 2021-10-28 VITALS — BP 132/74 | HR 73 | Temp 97.8°F | Ht 65.5 in | Wt 222.0 lb

## 2021-10-28 DIAGNOSIS — Z23 Encounter for immunization: Secondary | ICD-10-CM

## 2021-10-28 DIAGNOSIS — Z79899 Other long term (current) drug therapy: Secondary | ICD-10-CM | POA: Diagnosis not present

## 2021-10-28 DIAGNOSIS — G479 Sleep disorder, unspecified: Secondary | ICD-10-CM

## 2021-10-28 DIAGNOSIS — E538 Deficiency of other specified B group vitamins: Secondary | ICD-10-CM

## 2021-10-28 DIAGNOSIS — Z0000 Encounter for general adult medical examination without abnormal findings: Secondary | ICD-10-CM

## 2021-10-28 DIAGNOSIS — E786 Lipoprotein deficiency: Secondary | ICD-10-CM

## 2021-10-28 DIAGNOSIS — M7918 Myalgia, other site: Secondary | ICD-10-CM | POA: Diagnosis not present

## 2021-10-28 NOTE — Patient Instructions (Signed)
Good to see you today .  Sleep problem and pain could interfere with clarity of thinking .   Try  water exercise .   Pneumovax 23 .  Health Maintenance, Female Adopting a healthy lifestyle and getting preventive care are important in promoting health and wellness. Ask your health care provider about: The right schedule for you to have regular tests and exams. Things you can do on your own to prevent diseases and keep yourself healthy. What should I know about diet, weight, and exercise? Eat a healthy diet  Eat a diet that includes plenty of vegetables, fruits, low-fat dairy products, and lean protein. Do not eat a lot of foods that are high in solid fats, added sugars, or sodium. Maintain a healthy weight Body mass index (BMI) is used to identify weight problems. It estimates body fat based on height and weight. Your health care provider can help determine your BMI and help you achieve or maintain a healthy weight. Get regular exercise Get regular exercise. This is one of the most important things you can do for your health. Most adults should: Exercise for at least 150 minutes each week. The exercise should increase your heart rate and make you sweat (moderate-intensity exercise). Do strengthening exercises at least twice a week. This is in addition to the moderate-intensity exercise. Spend less time sitting. Even light physical activity can be beneficial. Watch cholesterol and blood lipids Have your blood tested for lipids and cholesterol at 68 years of age, then have this test every 5 years. Have your cholesterol levels checked more often if: Your lipid or cholesterol levels are high. You are older than 68 years of age. You are at high risk for heart disease. What should I know about cancer screening? Depending on your health history and family history, you may need to have cancer screening at various ages. This may include screening for: Breast cancer. Cervical  cancer. Colorectal cancer. Skin cancer. Lung cancer. What should I know about heart disease, diabetes, and high blood pressure? Blood pressure and heart disease High blood pressure causes heart disease and increases the risk of stroke. This is more likely to develop in people who have high blood pressure readings or are overweight. Have your blood pressure checked: Every 3-5 years if you are 83-43 years of age. Every year if you are 33 years old or older. Diabetes Have regular diabetes screenings. This checks your fasting blood sugar level. Have the screening done: Once every three years after age 9 if you are at a normal weight and have a low risk for diabetes. More often and at a younger age if you are overweight or have a high risk for diabetes. What should I know about preventing infection? Hepatitis B If you have a higher risk for hepatitis B, you should be screened for this virus. Talk with your health care provider to find out if you are at risk for hepatitis B infection. Hepatitis C Testing is recommended for: Everyone born from [birth year] through 1963. Anyone with known risk factors for hepatitis C. Sexually transmitted infections (STIs) Get screened for STIs, including gonorrhea and chlamydia, if: You are sexually active and are younger than 68 years of age. You are older than 68 years of age and your health care provider tells you that you are at risk for this type of infection. Your sexual activity has changed since you were last screened, and you are at increased risk for chlamydia or gonorrhea. Ask your health care provider if  you are at risk. Ask your health care provider about whether you are at high risk for HIV. Your health care provider may recommend a prescription medicine to help prevent HIV infection. If you choose to take medicine to prevent HIV, you should first get tested for HIV. You should then be tested every 3 months for as long as you are taking the  medicine. Pregnancy If you are about to stop having your period (premenopausal) and you may become pregnant, seek counseling before you get pregnant. Take 400 to 800 micrograms (mcg) of folic acid every day if you become pregnant. Ask for birth control (contraception) if you want to prevent pregnancy. Osteoporosis and menopause Osteoporosis is a disease in which the bones lose minerals and strength with aging. This can result in bone fractures. If you are 68 years old or older, or if you are at risk for osteoporosis and fractures, ask your health care provider if you should: Be screened for bone loss. Take a calcium or vitamin D supplement to lower your risk of fractures. Be given hormone replacement therapy (HRT) to treat symptoms of menopause. Follow these instructions at home: Alcohol use Do not drink alcohol if: Your health care provider tells you not to drink. You are pregnant, may be pregnant, or are planning to become pregnant. If you drink alcohol: Limit how much you have to: 0-1 drink a day. Know how much alcohol is in your drink. In the [place] one drink equals one 12 oz bottle of beer (355 mL), one 5 oz glass of wine (148 mL), or one 1 oz glass of hard liquor (44 mL). Lifestyle Do not use any products that contain nicotine or tobacco. These products include cigarettes, chewing tobacco, and vaping devices, such as e-cigarettes. If you need help quitting, ask your health care provider. Do not use street drugs. Do not share needles. Ask your health care provider for help if you need support or information about quitting drugs. General instructions Schedule regular health, dental, and eye exams. Stay current with your vaccines. Tell your health care provider if: You often feel depressed. You have ever been abused or do not feel safe at home. Summary Adopting a healthy lifestyle and getting preventive care are important in promoting health and wellness. Follow your health care  provider's instructions about healthy diet, exercising, and getting tested or screened for diseases. Follow your health care provider's instructions on monitoring your cholesterol and blood pressure. This information is not intended to replace advice given to you by your health care provider. Make sure you discuss any questions you have with your health care provider. Document Revised: 04/13/2021 Document Reviewed: 04/13/2021 Elsevier Patient Education  [place].

## 2021-10-28 NOTE — Progress Notes (Signed)
Chief Complaint  Patient presents with   Annual Exam    HPI: Patient  Natasha [name]  70 y.o. comes in today for Preventive Health Care visit  Taking b12 supplements  States that most recently months she feels that she has thought thoughts are scattered most days .   Arthritis   a problem  seeing dr Tamala Julian.    Shoulder pain causes some waking up.    If not taking  then waits up from pain .    Marland Kitchen  No ha new injuries  vision is improved .  Is it a problem.  Dermatology also Has been taking B12 since last year sublingual days due for labs like to make sure no metabolic causing the problem. Has moved to friends home which is nice and her son can eat with them but it is transition.  Her husband had a fall and a hip fracture in [place]. Health Maintenance  Topic Date Due   Hepatitis C Screening  Never done   COVID-19 Vaccine (5 - Booster for Moderna series) 07/03/2021   INFLUENZA VACCINE  07/06/2021   MAMMOGRAM  06/17/2023   COLONOSCOPY (Pts 45-13yrs Insurance coverage will need to be confirmed)  04/07/2025   TETANUS/TDAP  10/03/2028   Pneumonia Vaccine 57+ Years old  Completed   DEXA SCAN  Completed   Zoster Vaccines- Shingrix  Completed   HPV VACCINES  Aged Out   Health Maintenance Review LIFESTYLE:  Exercise:   moved to friends home and swimming water   exercise .  Tobacco/ETS: no Alcohol: n Sugar beverages:n Sleep: tylenol  pm    using  Drug use: no HH of  2  ROS:  REST of 12 system review negative except as per HPI   Past Medical History:  Diagnosis Date   Allergic rhinitis    Normal cardiac stress test     Past Surgical History:  Procedure Laterality Date   MYRINGOTOMY     right 7 th grade    Family History  Problem Relation Age of Onset   Lung disease Father    Cancer Father        Colon   Colon polyps Father    Multiple sclerosis Mother    Scleroderma Sister    Parkinson's disease Maternal Grandmother    Colon cancer Neg Hx    Breast cancer Neg Hx      Social History   Socioeconomic History   Marital status: Married    Spouse name: Not on file   Number of children: Not on file   Years of education: Not on file   Highest education level: Not on file  Occupational History   Not on file  Tobacco Use   Smoking status: Never   Smokeless tobacco: Never  Vaping Use   Vaping Use: Never used  Substance and Sexual Activity   Alcohol use: Yes    Alcohol/week: 1.0 standard drink    Types: 1 Standard drinks or equivalent per week    Comment: 1 every 2 weeks    Drug use: No   Sexual activity: Not on file  Other Topics Concern   Not on file  Social History Narrative   HH  of 3   Son adopted from [place] now 49 has FAS and microcephaly   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    No tob   Social Determinants of Health   Financial Resource Strain: Low Risk  Difficulty of Paying Living Expenses: Not hard at all  Food Insecurity: No Food Insecurity   Worried About [place] in the Last Year: Never true   Ran Out of Food in the Last Year: Never true  Transportation Needs: No Transportation Needs   Lack of Transportation (Medical): No   Lack of Transportation (Non-Medical): No  Physical Activity: Insufficiently Active   Days of Exercise per Week: 2 days   Minutes of Exercise per Session: 60 min  Stress: No Stress Concern Present   Feeling of Stress : Not at all  Social Connections: Moderately Integrated   Frequency of Communication with Friends and Family: More than three times a week   Frequency of Social Gatherings with Friends and Family: More than three times a week   Attends Religious Services: More than 4 times per year   Active Member of [employer] or Organizations: No   Attends Archivist Meetings: Never   Marital Status: Married    Outpatient Medications Prior to Visit  Medication Sig Dispense Refill   meloxicam (MOBIC) 7.5 MG tablet TAKE 1 TABLET BY MOUTH EVERY DAY 30 tablet 0    predniSONE (DELTASONE) 20 MG tablet Take 2 tablets (40 mg total) by mouth daily with breakfast. 20 tablet 0   azithromycin (ZITHROMAX) 500 MG tablet Take 500 mg by mouth daily.     chlorhexidine (PERIDEX) 0.12 % solution      No facility-administered medications prior to visit.     EXAM:  BP 132/74 (BP Location: Left Arm, Patient Position: Sitting, Cuff Size: Large)   Pulse 73   Temp 97.8 F (36.6 C) (Oral)   Ht 5' 5.5" (1.664 m)   Wt 222 lb (100.7 kg)   SpO2 97%   BMI 36.38 kg/m   Body mass index is 36.38 kg/m. Wt Readings from Last 3 Encounters:  10/28/21 222 lb (100.7 kg)  10/27/21 222 lb (100.7 kg)  05/06/21 222 lb (100.7 kg)    Physical Exam: Vital signs reviewed LOV:FIEP is a well-developed well-nourished alert cooperative    who appearsr stated age in no acute distress.  HEENT: normocephalic atraumatic , Eyes: PERRL EOM's full, conjunctiva clear, Nares: paten,t no deformity discharge or tenderness., Ears: no deformity EAC's clear TMs with normal landmarks. Mouth: masked  NECK: supple without masses, thyromegaly or bruits. CHEST/PULM:  Clear to auscultation and percussion breath sounds equal no wheeze , rales or rhonchi.  Breast: normal by inspection . No dimpling, discharge, masses, tenderness or discharge . CV: PMI is nondisplaced, S1 S2 no gallops, murmurs, rubs. Peripheral pulses are full without delay.No JVD .  ABDOMEN: Bowel sounds normal nontender  No guard or rebound, no hepato splenomegal no CVA tenderness.  Extremtities:  No clubbing cyanosis or edema, no acute joint swelling or redness no focal atrophy NEURO:  Oriented x3, cranial nerves 3-12 appear to be intact, no obvious focal weakness,gait within normal limits no abnormal reflexes or asymmetrical SKIN: No acute rashes normal turgor, color, no bruising or petechiae. PSYCH: Oriented, good eye contact, no obvious depression anxiety, cognition and judgment appear normal. LN: no cervical axillary  adenopathy  Lab Results  Component Value Date   WBC 6.6 10/23/2020   HGB 14.9 10/23/2020   HCT 44.4 10/23/2020   PLT 265.0 10/23/2020   GLUCOSE 97 10/23/2020   CHOL 144 07/04/2020   TRIG 105.0 07/04/2020   HDL 32.80 (L) 07/04/2020   LDLCALC 90 07/04/2020   ALT 15 10/23/2020   AST 13 10/23/2020  NA 138 10/23/2020   K 4.0 10/23/2020   CL 103 10/23/2020   CREATININE 1.01 10/23/2020   BUN 16 10/23/2020   CO2 30 10/23/2020   TSH 0.92 10/23/2020   HGBA1C 5.0 08/18/2016    BP Readings from Last 3 Encounters:  10/28/21 132/74  10/27/21 110/76  05/06/21 120/78    Lab plan  reviewed with patient   ASSESSMENT AND PLAN:  Discussed the following assessment and plan:    ICD-10-CM   1. Visit for preventive health examination  Z00.00     2. Sleep disturbance  G47.9 Sedimentation rate    C-reactive protein    Vitamin B12    T4, free    TSH    Lipid panel    Hemoglobin A1c    Hepatic function panel    CBC with Differential/Platelet    Basic metabolic panel    Basic metabolic panel    CBC with Differential/Platelet    Hepatic function panel    Hemoglobin A1c    Lipid panel    TSH    T4, free    Vitamin B12    C-reactive protein    Sedimentation rate    CANCELED: Basic metabolic panel    CANCELED: CBC with Differential/Platelet    CANCELED: Hepatic function panel    CANCELED: Hemoglobin A1c    CANCELED: Lipid panel    CANCELED: TSH    CANCELED: T4, free    CANCELED: Vitamin B12    CANCELED: C-reactive protein    CANCELED: Sedimentation rate    3. Vitamin B 12 deficiency  E53.8 Sedimentation rate    C-reactive protein    Vitamin B12    T4, free    TSH    Lipid panel    Hemoglobin A1c    Hepatic function panel    CBC with Differential/Platelet    Basic metabolic panel    Basic metabolic panel    CBC with Differential/Platelet    Hepatic function panel    Hemoglobin A1c    Lipid panel    TSH    T4, free    Vitamin B12    C-reactive protein     Sedimentation rate    CANCELED: Basic metabolic panel    CANCELED: CBC with Differential/Platelet    CANCELED: Hepatic function panel    CANCELED: Hemoglobin A1c    CANCELED: Lipid panel    CANCELED: TSH    CANCELED: T4, free    CANCELED: Vitamin B12    CANCELED: C-reactive protein    CANCELED: Sedimentation rate    4. Low HDL (under 40)  E78.6 Sedimentation rate    C-reactive protein    Vitamin B12    T4, free    TSH    Lipid panel    Hemoglobin A1c    Hepatic function panel    CBC with Differential/Platelet    Basic metabolic panel    Basic metabolic panel    CBC with Differential/Platelet    Hepatic function panel    Hemoglobin A1c    Lipid panel    TSH    T4, free    Vitamin B12    C-reactive protein    Sedimentation rate    CANCELED: Basic metabolic panel    CANCELED: CBC with Differential/Platelet    CANCELED: Hepatic function panel    CANCELED: Hemoglobin A1c    CANCELED: Lipid panel    CANCELED: TSH    CANCELED: T4, free    CANCELED: Vitamin B12  CANCELED: C-reactive protein    CANCELED: Sedimentation rate    5. Musculoskeletal pain  M79.18 Sedimentation rate    C-reactive protein    Vitamin B12    T4, free    TSH    Lipid panel    Hemoglobin A1c    Hepatic function panel    CBC with Differential/Platelet    Basic metabolic panel    Basic metabolic panel    CBC with Differential/Platelet    Hepatic function panel    Hemoglobin A1c    Lipid panel    TSH    T4, free    Vitamin B12    C-reactive protein    Sedimentation rate    CANCELED: Basic metabolic panel    CANCELED: CBC with Differential/Platelet    CANCELED: Hepatic function panel    CANCELED: Hemoglobin A1c    CANCELED: Lipid panel    CANCELED: TSH    CANCELED: T4, free    CANCELED: Vitamin B12    CANCELED: C-reactive protein    CANCELED: Sedimentation rate   interfering     6. Medication management  Z79.899 Sedimentation rate    C-reactive protein    Vitamin B12    T4, free     TSH    Lipid panel    Hemoglobin A1c    Hepatic function panel    CBC with Differential/Platelet    Basic metabolic panel    Basic metabolic panel    CBC with Differential/Platelet    Hepatic function panel    Hemoglobin A1c    Lipid panel    TSH    T4, free    Vitamin B12    C-reactive protein    Sedimentation rate    CANCELED: Basic metabolic panel    CANCELED: CBC with Differential/Platelet    CANCELED: Hepatic function panel    CANCELED: Hemoglobin A1c    CANCELED: Lipid panel    CANCELED: TSH    CANCELED: T4, free    CANCELED: Vitamin B12    CANCELED: C-reactive protein    CANCELED: Sedimentation rate    7. Need for 23-polyvalent pneumococcal polysaccharide vaccine  Z23 Pneumococcal polysaccharide vaccine 23-valent greater than or equal to 2yo subcutaneous/IM    Check metabolic H63 etc .  Fu if  persistent or progressive  sleep issue and meds may be contributing Return in about 1 year (around 10/28/2022) for depending on results.  Patient Care Team: Burnis Medin, MD as PCP - General Patient Instructions  Good to see you today .  Sleep problem and pain could interfere with clarity of thinking .   Try  water exercise .   Pneumovax 23 .  Health Maintenance, Female Adopting a healthy lifestyle and getting preventive care are important in promoting health and wellness. Ask your health care provider about: The right schedule for you to have regular tests and exams. Things you can do on your own to prevent diseases and keep yourself healthy. What should I know about diet, weight, and exercise? Eat a healthy diet  Eat a diet that includes plenty of vegetables, fruits, low-fat dairy products, and lean protein. Do not eat a lot of foods that are high in solid fats, added sugars, or sodium. Maintain a healthy weight Body mass index (BMI) is used to identify weight problems. It estimates body fat based on height and weight. Your health care provider can help  determine your BMI and help you achieve or maintain a healthy weight. Get regular exercise Get  regular exercise. This is one of the most important things you can do for your health. Most adults should: Exercise for at least 150 minutes each week. The exercise should increase your heart rate and make you sweat (moderate-intensity exercise). Do strengthening exercises at least twice a week. This is in addition to the moderate-intensity exercise. Spend less time sitting. Even light physical activity can be beneficial. Watch cholesterol and blood lipids Have your blood tested for lipids and cholesterol at 68 years of age, then have this test every 5 years. Have your cholesterol levels checked more often if: Your lipid or cholesterol levels are high. You are older than 68 years of age. You are at high risk for heart disease. What should I know about cancer screening? Depending on your health history and family history, you may need to have cancer screening at various ages. This may include screening for: Breast cancer. Cervical cancer. Colorectal cancer. Skin cancer. Lung cancer. What should I know about heart disease, diabetes, and high blood pressure? Blood pressure and heart disease High blood pressure causes heart disease and increases the risk of stroke. This is more likely to develop in people who have high blood pressure readings or are overweight. Have your blood pressure checked: Every 3-5 years if you are 60-51 years of age. Every year if you are 93 years old or older. Diabetes Have regular diabetes screenings. This checks your fasting blood sugar level. Have the screening done: Once every three years after age 16 if you are at a normal weight and have a low risk for diabetes. More often and at a younger age if you are overweight or have a high risk for diabetes. What should I know about preventing infection? Hepatitis B If you have a higher risk for hepatitis B, you should be  screened for this virus. Talk with your health care provider to find out if you are at risk for hepatitis B infection. Hepatitis C Testing is recommended for: Everyone born from [birth year] through 1963. Anyone with known risk factors for hepatitis C. Sexually transmitted infections (STIs) Get screened for STIs, including gonorrhea and chlamydia, if: You are sexually active and are younger than 68 years of age. You are older than 68 years of age and your health care provider tells you that you are at risk for this type of infection. Your sexual activity has changed since you were last screened, and you are at increased risk for chlamydia or gonorrhea. Ask your health care provider if you are at risk. Ask your health care provider about whether you are at high risk for HIV. Your health care provider may recommend a prescription medicine to help prevent HIV infection. If you choose to take medicine to prevent HIV, you should first get tested for HIV. You should then be tested every 3 months for as long as you are taking the medicine. Pregnancy If you are about to stop having your period (premenopausal) and you may become pregnant, seek counseling before you get pregnant. Take 400 to 800 micrograms (mcg) of folic acid every day if you become pregnant. Ask for birth control (contraception) if you want to prevent pregnancy. Osteoporosis and menopause Osteoporosis is a disease in which the bones lose minerals and strength with aging. This can result in bone fractures. If you are 17 years old or older, or if you are at risk for osteoporosis and fractures, ask your health care provider if you should: Be screened for bone loss. Take a calcium or  vitamin D supplement to lower your risk of fractures. Be given hormone replacement therapy (HRT) to treat symptoms of menopause. Follow these instructions at home: Alcohol use Do not drink alcohol if: Your health care provider tells you not to drink. You are  pregnant, may be pregnant, or are planning to become pregnant. If you drink alcohol: Limit how much you have to: 0-1 drink a day. Know how much alcohol is in your drink. In the [place] one drink equals one 12 oz bottle of beer (355 mL), one 5 oz glass of wine (148 mL), or one 1 oz glass of hard liquor (44 mL). Lifestyle Do not use any products that contain nicotine or tobacco. These products include cigarettes, chewing tobacco, and vaping devices, such as e-cigarettes. If you need help quitting, ask your health care provider. Do not use street drugs. Do not share needles. Ask your health care provider for help if you need support or information about quitting drugs. General instructions Schedule regular health, dental, and eye exams. Stay current with your vaccines. Tell your health care provider if: You often feel depressed. You have ever been abused or do not feel safe at home. Summary Adopting a healthy lifestyle and getting preventive care are important in promoting health and wellness. Follow your health care provider's instructions about healthy diet, exercising, and getting tested or screened for diseases. Follow your health care provider's instructions on monitoring your cholesterol and blood pressure. This information is not intended to replace advice given to you by your health care provider. Make sure you discuss any questions you have with your health care provider. Document Revised: 04/13/2021 Document Reviewed: 04/13/2021 Elsevier Patient Education  2022 D[employer]el Stiven Kaspar M.D.

## 2021-10-29 LAB — CBC WITH DIFFERENTIAL/PLATELET
Absolute Monocytes: 459 cells/uL (ref 200–950)
Basophils Absolute: 57 cells/uL (ref 0–200)
Basophils Relative: 0.7 %
Eosinophils Absolute: 90 cells/uL (ref 15–500)
Eosinophils Relative: 1.1 %
HCT: 42.2 % (ref 35.0–45.0)
Hemoglobin: 14.3 g/dL (ref 11.7–15.5)
Lymphs Abs: 1181 cells/uL (ref 850–3900)
MCH: 32.1 pg (ref 27.0–33.0)
MCHC: 33.9 g/dL (ref 32.0–36.0)
MCV: 94.8 fL (ref 80.0–100.0)
MPV: 11.5 fL (ref 7.5–12.5)
Monocytes Relative: 5.6 %
Neutro Abs: 6412 cells/uL (ref 1500–7800)
Neutrophils Relative %: 78.2 %
Platelets: 294 10*3/uL (ref 140–400)
RBC: 4.45 10*6/uL (ref 3.80–5.10)
RDW: 12.4 % (ref 11.0–15.0)
Total Lymphocyte: 14.4 %
WBC: 8.2 10*3/uL (ref 3.8–10.8)

## 2021-10-29 LAB — HEPATIC FUNCTION PANEL
AG Ratio: 1.9 (calc) (ref 1.0–2.5)
ALT: 14 U/L (ref 6–29)
AST: 11 U/L (ref 10–35)
Albumin: 4.6 g/dL (ref 3.6–5.1)
Alkaline phosphatase (APISO): 103 U/L (ref 37–153)
Bilirubin, Direct: 0.1 mg/dL (ref 0.0–0.2)
Globulin: 2.4 g/dL (calc) (ref 1.9–3.7)
Indirect Bilirubin: 0.3 mg/dL (calc) (ref 0.2–1.2)
Total Bilirubin: 0.4 mg/dL (ref 0.2–1.2)
Total Protein: 7 g/dL (ref 6.1–8.1)

## 2021-10-29 LAB — C-REACTIVE PROTEIN: CRP: 2 mg/L (ref <8.0)

## 2021-10-29 LAB — LIPID PANEL
Cholesterol: 194 mg/dL (ref <200)
HDL: 42 mg/dL — ABNORMAL LOW (ref >=50)
LDL Cholesterol (Calc): 122 mg/dL (calc) — ABNORMAL HIGH
Non-HDL Cholesterol (Calc): 152 mg/dL (calc) — ABNORMAL HIGH (ref <130)
Total CHOL/HDL Ratio: 4.6 (calc) (ref <5.0)
Triglycerides: 187 mg/dL — ABNORMAL HIGH (ref <150)

## 2021-10-29 LAB — VITAMIN B12: Vitamin B-12: >2000 pg/mL — ABNORMAL HIGH (ref 200–1100)

## 2021-10-29 LAB — HEMOGLOBIN A1C
Hgb A1c MFr Bld: 5.1 % of total Hgb (ref <5.7)
Mean Plasma Glucose: 100 mg/dL
eAG (mmol/L): 5.5 mmol/L

## 2021-10-29 LAB — BASIC METABOLIC PANEL
BUN/Creatinine Ratio: 32 (calc) — ABNORMAL HIGH (ref 6–22)
BUN: 28 mg/dL — ABNORMAL HIGH (ref 7–25)
CO2: 27 mmol/L (ref 20–32)
Calcium: 10.7 mg/dL — ABNORMAL HIGH (ref 8.6–10.4)
Chloride: 104 mmol/L (ref 98–110)
Creat: 0.88 mg/dL (ref 0.50–1.05)
Glucose, Bld: 99 mg/dL (ref 65–99)
Potassium: 4 mmol/L (ref 3.5–5.3)
Sodium: 141 mmol/L (ref 135–146)

## 2021-10-29 LAB — SEDIMENTATION RATE: Sed Rate: 11 mm/h (ref 0–30)

## 2021-10-29 LAB — T4, FREE: Free T4: 1 ng/dL (ref 0.8–1.8)

## 2021-10-29 LAB — TSH: TSH: 1.48 mIU/L (ref 0.40–4.50)

## 2021-11-05 NOTE — Progress Notes (Signed)
B12 level is high can take less frequent B12 supplement but do not go off inflammatory markers are normal and not elevated there is no diabetes or anemia.  The calcium level is slightly elevated which is probably not related to calcium you are taking. However would like to repeat your calcium levels with the PTH level and vitamin D updated level these levels were done over a year ago but want to make sure they are stable Natasha [name] please order bmp, iPTH and vit d level and have her be well hydrated for the labs  dx  elevated calcium level.

## 2021-11-09 NOTE — Addendum Note (Signed)
Addended by: Agnes Lawrence on: 11/09/2021 04:11 PM   Modules accepted: Orders

## 2021-11-13 ENCOUNTER — Other Ambulatory Visit: Payer: Medicare HMO

## 2021-11-20 ENCOUNTER — Other Ambulatory Visit (INDEPENDENT_AMBULATORY_CARE_PROVIDER_SITE_OTHER): Payer: Medicare HMO

## 2021-11-20 LAB — BASIC METABOLIC PANEL
BUN: 17 mg/dL (ref 6–23)
CO2: 28 mEq/L (ref 19–32)
Calcium: 9.6 mg/dL (ref 8.4–10.5)
Chloride: 105 mEq/L (ref 96–112)
Creatinine, Ser: 0.82 mg/dL (ref 0.40–1.20)
GFR: 73.61 mL/min (ref >60.00)
Glucose, Bld: 70 mg/dL (ref 70–99)
Potassium: 4.4 mEq/L (ref 3.5–5.1)
Sodium: 140 mEq/L (ref 135–145)

## 2021-11-20 LAB — VITAMIN D 25 HYDROXY (VIT D DEFICIENCY, FRACTURES): VITD: 25.31 ng/mL — ABNORMAL LOW (ref 30.00–100.00)

## 2021-11-23 LAB — PTH, INTACT AND CALCIUM
Calcium: 9.7 mg/dL (ref 8.6–10.4)
PTH: 22 pg/mL (ref 16–77)

## 2021-11-25 NOTE — Progress Notes (Signed)
Calcium now in normal rang,Vit D still slightly low increase vit D intake by1000 IU per day .

## 2021-12-11 NOTE — Progress Notes (Signed)
Natasha [name] Phone: [phone] Subjective:   Natasha [name], am serving as a scribe for Dr. Hulan Saas. This visit occurred during the SARS-CoV-2 public health emergency.  Safety protocols were in place, including screening questions prior to the visit, additional usage of staff PPE, and extensive cleaning of exam room while observing appropriate contact time as indicated for disinfecting solutions.  I'm seeing this patient by the request  of:  Panosh, Standley Brooking, MD  CC: knee pain   [id number]  10/27/2021 Responded well to the injection with improvement in range of motion.  Does have meloxicam if necessary for breakthrough pain.  We will start with formal physical therapy.  Follow-up again in 6 to 8 weeks  Injection given today, tolerated procedure well, discussed icing regimen and home exercises.  Increase activity slowly.  Hopefully this will be more beneficial.  X-rays are pending.  Known arthritic changes.  Discussed with patient at great length about the potential need for injections or the possibility of viscosupplementation again.  Patient will consider this.  Wants to hold off on knee replacement at the moment.  Will refer patient to physical therapy to see how patient responds.  Follow-up with me again in 4 to 8 weeks.  Update 12/15/2021 Natasha [name] is a 71 y.o. female coming in with complaint of R shoulder and B knee pain. Patient states that her pain is tolerable and doing much better. Is able to walk for exercise.   Shoulder pain is 2/10 and improved after injection last visit.   Would like to talk about vitamins, cramping in foot and shin, and would like to get another handicap placard.       Past Medical History:  Diagnosis Date   Allergic rhinitis    Normal cardiac stress test    Past Surgical History:  Procedure Laterality Date   MYRINGOTOMY     right 7 th grade   Social History    Socioeconomic History   Marital status: Married    Spouse name: Not on file   Number of children: Not on file   Years of education: Not on file   Highest education level: Not on file  Occupational History   Not on file  Tobacco Use   Smoking status: Never   Smokeless tobacco: Never  Vaping Use   Vaping Use: Never used  Substance and Sexual Activity   Alcohol use: Yes    Alcohol/week: 1.0 standard drink    Types: 1 Standard drinks or equivalent per week    Comment: 1 every 2 weeks    Drug use: [name]   Sexual activity: Not on file  Other Topics Concern   Not on file  Social History Narrative   HH  of 3   Son adopted from [place] now 49 has FAS and microcephaly   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    [name] tob   Social Determinants of Radio broadcast assistant Strain: Low Risk    Difficulty of Paying Living Expenses: Not hard at all  Food Insecurity: [name] Food Insecurity   Worried About Char[employer] fundraiser in the Last Year: Never true   [place] in the Last Year: Never true  Transportation Needs: [name] Transportation Needs   Lack of Transportation (Medical): [name]   Lack of Transportation (Non-Medical): [name]  Physical Activity: Insufficiently Active   Days of Exercise per  Week: 2 days   Minutes of Exercise per Session: 60 min  Stress: [name] Stress Concern Present   Feeling of Stress : Not at all  Social Connections: Moderately Integrated   Frequency of Communication with Friends and Family: More than three times a week   Frequency of Social Gatherings with Friends and Family: More than three times a week   Attends Religious Services: More than 4 times per year   Active Member of [employer] or Organizations: [name]   Attends Music therapist: Never   Marital Status: Married   [name] Known Allergies Family History  Problem Relation Age of Onset   Lung disease Father    Cancer Father        Colon   Colon polyps Father    Multiple sclerosis Mother     Scleroderma Sister    Parkinson's disease Maternal Grandmother    Colon cancer Neg Hx    Breast cancer Neg Hx     Current Outpatient Medications (Endocrine & Metabolic):    predniSONE (DELTASONE) 20 MG tablet, Take 2 tablets (40 mg total) by mouth daily with breakfast.    Current Outpatient Medications (Analgesics):    meloxicam (MOBIC) 7.5 MG tablet, TAKE 1 TABLET BY MOUTH EVERY DAY   Current Outpatient Medications (Other):    Vitamin D, Ergocalciferol, (DRISDOL) 1.25 MG (50000 UNIT) CAPS capsule, Take 1 capsule (50,000 Units total) by mouth every 7 (seven) days.    Objective  Blood pressure 110/80, pulse 84, height 5' 5.5" (1.664 m), SpO2 97 %.   General: [name] apparent distress alert and oriented x3 mood and affect normal, dressed appropriately.  HEENT: Pupils equal, extraocular movements intact  Respiratory: Patient's speak in full sentences and does not appear short of breath  Cardiovascular: [name] lower extremity edema, non tender, [name] erythema  Gait normal with good balance and coordination.  MSK: Bilateral knee exams do have arthritic changes noted.  Instability with valgus and varus force but patient is nontender on exam today.  Patient does have some very mild impingement still noted of the right shoulder but is significant improvement in range of motion and strength.    Impression and Recommendations:     The above documentation has been reviewed and is accurate and complete Lyndal Pulley, DO

## 2021-12-15 ENCOUNTER — Other Ambulatory Visit: Payer: Self-pay

## 2021-12-15 ENCOUNTER — Encounter: Payer: Self-pay | Admitting: Family Medicine

## 2021-12-15 ENCOUNTER — Ambulatory Visit: Payer: Self-pay

## 2021-12-15 ENCOUNTER — Ambulatory Visit: Payer: Medicare HMO | Admitting: Family Medicine

## 2021-12-15 VITALS — BP 110/80 | HR 84 | Ht 65.5 in

## 2021-12-15 DIAGNOSIS — M7551 Bursitis of right shoulder: Secondary | ICD-10-CM | POA: Diagnosis not present

## 2021-12-15 DIAGNOSIS — M170 Bilateral primary osteoarthritis of knee: Secondary | ICD-10-CM

## 2021-12-15 DIAGNOSIS — M48062 Spinal stenosis, lumbar region with neurogenic claudication: Secondary | ICD-10-CM | POA: Diagnosis not present

## 2021-12-15 DIAGNOSIS — M25511 Pain in right shoulder: Secondary | ICD-10-CM

## 2021-12-15 MED ORDER — VITAMIN D (ERGOCALCIFEROL) 1.25 MG (50000 UNIT) PO CAPS: 50000.0000 [IU] | ORAL_CAPSULE | ORAL | 0 refills | Status: DC

## 2021-12-15 NOTE — Patient Instructions (Addendum)
Once weekly Vit D called in See me again in 3 months

## 2021-12-15 NOTE — Assessment & Plan Note (Signed)
Significant improvement after the injection.  No change in management.

## 2021-12-15 NOTE — Assessment & Plan Note (Signed)
Patient overall is doing well.  Discussed icing regimen and home exercises.  Discussed which activities to do which wants to avoid.  Increase activity slowly.  Follow-up again in 6 to 8 weeks

## 2021-12-15 NOTE — Assessment & Plan Note (Signed)
Patient does have underlying back pain and we will continue to monitor.  Continue prednisone for breakthrough as well as epidurals which has helped tremendously in the past.

## 2022-02-09 DIAGNOSIS — T1512XA Foreign body in conjunctival sac, left eye, initial encounter: Secondary | ICD-10-CM | POA: Diagnosis not present

## 2022-02-15 ENCOUNTER — Encounter: Payer: Self-pay | Admitting: Internal Medicine

## 2022-02-15 ENCOUNTER — Telehealth (INDEPENDENT_AMBULATORY_CARE_PROVIDER_SITE_OTHER): Payer: Medicare HMO | Admitting: Internal Medicine

## 2022-02-15 DIAGNOSIS — J988 Other specified respiratory disorders: Secondary | ICD-10-CM | POA: Diagnosis not present

## 2022-02-15 DIAGNOSIS — U071 COVID-19: Secondary | ICD-10-CM

## 2022-02-15 NOTE — Progress Notes (Signed)
?Virtual Visit via Video Note ? ?I connected with Natasha [name] on 02/15/22 at  9:00 AM EDT by a video enabled telemedicine application and verified that I am speaking with the correct person using two identifiers. ?Location patient: home ?Location provider:work office ?Persons participating in the virtual visit: patient, provider ? ?[place] national recommendations  regarding COVID 19 pandemic   video visit is advised over in office visit for this patient.  ?Patient aware  of the limitations of evaluation and management by telemedicine and  availability of in person appointments. and agreed to proceed. ? ? ?HPI: ?Natasha [name] presents for video visit see message onset the evening of March 9 with headache fatigue then developed chills without fever sore throat and eventually lost sense of taste and smell and a minor cough. ?No current fever and chills headache is better intermittent cough no shortness of breath does not feel as bad as a regular flu. ?Last immunization COVID was in the fall. ?Rapid home test for COVID was positive. ?Uncertain where she was exposed but did have a doctor visit 2 days before onset of symptoms. ? ?Is taking high-dose vitamin D per Dr. Tamala Julian. ?Is on day 5 of symptoms wants to discuss whether Paxlovid would be helpful. ?ROS: See pertinent positives and negatives per HPI. ? ?Past Medical History:  ?Diagnosis Date  ? Allergic rhinitis   ? Normal cardiac stress test   ? ? ?Past Surgical History:  ?Procedure Laterality Date  ? MYRINGOTOMY    ? right 7 th grade  ? ? ?Family History  ?Problem Relation Age of Onset  ? Lung disease Father   ? Cancer Father   ?     Colon  ? Colon polyps Father   ? Multiple sclerosis Mother   ? Scleroderma Sister   ? Parkinson's disease Maternal Grandmother   ? Colon cancer Neg Hx   ? Breast cancer Neg Hx   ? ? ?Social History  ? ?Tobacco Use  ? Smoking status: Never  ? Smokeless tobacco: Never  ?Vaping Use  ? Vaping Use: Never used  ?Substance Use Topics  ?  Alcohol use: Yes  ?  Alcohol/week: 1.0 standard drink  ?  Types: 1 Standard drinks or equivalent per week  ?  Comment: 1 every 2 weeks   ? Drug use: No  ? ? ? ? ?Current Outpatient Medications:  ?  meloxicam (MOBIC) 7.5 MG tablet, TAKE 1 TABLET BY MOUTH EVERY DAY, Disp: 30 tablet, Rfl: 0 ?  predniSONE (DELTASONE) 20 MG tablet, Take 2 tablets (40 mg total) by mouth daily with breakfast., Disp: 20 tablet, Rfl: 0 ?  Vitamin D, Ergocalciferol, (DRISDOL) 1.25 MG (50000 UNIT) CAPS capsule, Take 1 capsule (50,000 Units total) by mouth every 7 (seven) days., Disp: 12 capsule, Rfl: 0 ? ?EXAM: ?BP Readings from Last 3 Encounters:  ?12/15/21 110/80  ?10/28/21 132/74  ?10/27/21 110/76  ? ? ?VITALS per patient if applicable: ? ?GENERAL: alert, oriented, appears well and in no acute distress ? ?HEENT: atraumatic, conjunttiva clear, no obvious abnormalities on inspection of external nose and ears ?Mild congestion nontoxic occasional cough. ?NECK: normal movements of the head and neck ? ?LUNGS: on inspection no signs of respiratory distress, breathing rate appears normal, no obvious gross SOB, gasping or wheezing ? ?CV: no obvious cyanosis ? ?MS: moves all visible extremities without noticeable abnormality ? ?PSYCH/NEURO: pleasant and cooperative, no obvious depression or anxiety, speech and thought processing grossly intact ?Lab Results  ?Component Value Date  ?  WBC 8.2 10/28/2021  ? HGB 14.3 10/28/2021  ? HCT 42.2 10/28/2021  ? PLT 294 10/28/2021  ? GLUCOSE 70 11/20/2021  ? CHOL 194 10/28/2021  ? TRIG 187 (H) 10/28/2021  ? HDL 42 (L) 10/28/2021  ? LDLCALC 122 (H) 10/28/2021  ? ALT 14 10/28/2021  ? AST 11 10/28/2021  ? NA 140 11/20/2021  ? K 4.4 11/20/2021  ? CL 105 11/20/2021  ? CREATININE 0.82 11/20/2021  ? BUN 17 11/20/2021  ? CO2 28 11/20/2021  ? TSH 1.48 10/28/2021  ? HGBA1C 5.1 10/28/2021  ? ? ?ASSESSMENT AND PLAN: ? ?Discussed the following assessment and plan: ? ?  ICD-10-CM   ?1. Respiratory tract infection due to  COVID-19 virus  U07.1   ? J98.8   ? vaccinated  and boosted  ?  ? ?Discussed risk benefit of Paxlovid as she is significantly improved and her risk score is only 2.  Decision to not take antiviral at this time but to continue supportive care expectant management. ?Follow-up or contact with alarm symptoms.  Continue her vitamin D. ?Counseled.  ? Expectant management and discussion of plan and treatment with opportunity to ask questions and all were answered. The patient agreed with the plan and demonstrated an understanding of the instructions. ?  ?Advised to call back or seek an in-person evaluation if worsening  or having  further concerns  in interim. ?No follow-ups on file. ? ? ? ?Shanon Ace, MD  ?

## 2022-03-09 ENCOUNTER — Telehealth: Payer: Self-pay | Admitting: Internal Medicine

## 2022-03-09 NOTE — Telephone Encounter (Signed)
Left message for patient to call back and schedule Medicare Annual Wellness Visit (AWV) either virtually or in office. Left  my Herbie Drape number [phone] ? ? ?Last AWV 03/10/21 ? please schedule at anytime with [facility] Nurse Health Advisor 1 or 2 ? ? ? ?

## 2022-03-18 ENCOUNTER — Ambulatory Visit: Payer: Medicare HMO | Admitting: Family Medicine

## 2022-03-21 ENCOUNTER — Other Ambulatory Visit: Payer: Self-pay | Admitting: Family Medicine

## 2022-03-30 ENCOUNTER — Ambulatory Visit (INDEPENDENT_AMBULATORY_CARE_PROVIDER_SITE_OTHER): Payer: Medicare HMO

## 2022-03-30 VITALS — Ht 65.0 in | Wt 222.0 lb

## 2022-03-30 DIAGNOSIS — Z0000 Encounter for general adult medical examination without abnormal findings: Secondary | ICD-10-CM | POA: Diagnosis not present

## 2022-03-30 NOTE — Progress Notes (Signed)
? ?Subjective:  ? Natasha [name] is a 71 y.o. female who presents for Medicare Annual (Subsequent) preventive examination. ? ?Review of Systems    ?Virtual Visit via Telephone Note ? ?I connected with  Natasha [name] on 03/30/22 at 10:45 AM EDT by telephone and verified that I am speaking with the correct person using two identifiers. ? ?Location: ?Patient: Home ?Provider: Office ?Persons participating in the virtual visit: patient/Nurse Health Advisor ?  ?I discussed the limitations, risks, security and privacy concerns of performing an evaluation and management service by telephone and the availability of in person appointments. The patient expressed understanding and agreed to proceed. ? ?Interactive audio and video telecommunications were attempted between this nurse and patient, however failed, due to patient having technical difficulties OR patient did not have access to video capability.  We continued and completed visit with audio only. ? ?Some vital signs may be absent or patient reported.  ? ?Criselda Peaches, LPN  ?Cardiac Risk Factors include: advanced age (>56mn, >>25women) ? ?   ?Objective:  ?  ?Today's Vitals  ? 03/30/22 1052  ?Weight: 222 lb (100.7 kg)  ?Height: '5\' 5"'$  (1.651 m)  ? ?Body mass index is 36.94 kg/m?. ? ? ?  03/30/2022  ? 11:01 AM 03/10/2021  ? 12:43 PM 05/03/2017  ?  1:01 AM 09/11/2014  ?  4:12 PM 09/11/2014  ?  4:01 PM  ?Advanced Directives  ?Does Patient Have a Medical Advance Directive? Yes No No No No  ?Type of AParamedicof [place] will      ?Does patient want to make changes to medical advance directive? No - Patient declined      ?Copy of [place]? No - copy requested      ?Would patient like information on creating a medical advance directive?  No - Patient declined No - Patient declined No - patient declined information No - patient declined information  ? ? ?Current Medications (verified) ?Outpatient Encounter  Medications as of 03/30/2022  ?Medication Sig  ? predniSONE (DELTASONE) 20 MG tablet Take 2 tablets (40 mg total) by mouth daily with breakfast.  ? Vitamin D, Ergocalciferol, (DRISDOL) 1.25 MG (50000 UNIT) CAPS capsule TAKE 1 CAPSULE (50,000 UNITS TOTAL) BY MOUTH EVERY 7 (SEVEN) DAYS  ? [DISCONTINUED] meloxicam (MOBIC) 7.5 MG tablet TAKE 1 TABLET BY MOUTH EVERY DAY  ? ?No facility-administered encounter medications on file as of 03/30/2022.  ? ? ?Allergies (verified) ?Patient has no known allergies.  ? ?History: ?Past Medical History:  ?Diagnosis Date  ? Allergic rhinitis   ? Normal cardiac stress test   ? ?Past Surgical History:  ?Procedure Laterality Date  ? MYRINGOTOMY    ? right 7 th grade  ? ?Family History  ?Problem Relation Age of Onset  ? Lung disease Father   ? Cancer Father   ?     Colon  ? Colon polyps Father   ? Multiple sclerosis Mother   ? Scleroderma Sister   ? Parkinson's disease Maternal Grandmother   ? Colon cancer Neg Hx   ? Breast cancer Neg Hx   ? ?Social History  ? ?Socioeconomic History  ? Marital status: Married  ?  Spouse name: Not on file  ? Number of children: Not on file  ? Years of education: Not on file  ? Highest education level: Not on file  ?Occupational History  ? Not on file  ?Tobacco Use  ? Smoking status: Never  ?  Smokeless tobacco: Never  ?Vaping Use  ? Vaping Use: Never used  ?Substance and Sexual Activity  ? Alcohol use: Yes  ?  Alcohol/week: 1.0 standard drink  ?  Types: 1 Standard drinks or equivalent per week  ?  Comment: 1 every 2 weeks   ? Drug use: No  ? Sexual activity: Not on file  ?Other Topics Concern  ? Not on file  ?Social History Narrative  ? HH  of 3  ? Son adopted from [place] now 14 has FAS and microcephaly  ? 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   ? No tob  ? ?Social Determinants of Health  ? ?Financial Resource Strain: Low Risk   ? Difficulty of Paying Living Expenses: Not hard at all  ?Food Insecurity: No Food Insecurity  ? Worried About  Charity fundraiser in the Last Year: Never true  ? Ran Out of Food in the Last Year: Never true  ?Transportation Needs: No Transportation Needs  ? Lack of Transportation (Medical): No  ? Lack of Transportation (Non-Medical): No  ?Physical Activity: Sufficiently Active  ? Days of Exercise per Week: 3 days  ? Minutes of Exercise per Session: 60 min  ?Stress: No Stress Concern Present  ? Feeling of Stress : Not at all  ?Social Connections: Socially Integrated  ? Frequency of Communication with Friends and Family: More than three times a week  ? Frequency of Social Gatherings with Friends and Family: More than three times a week  ? Attends Religious Services: More than 4 times per year  ? Active Member of Clubs or Organizations: Yes  ? Attends Archivist Meetings: More than 4 times per year  ? Marital Status: Married  ? ? ? ?Clinical Intake: ? ? ?Diabetic?  No ? ? ?Activities of Daily Living ? ?  03/30/2022  ? 10:59 AM  ?In your present state of health, do you have any difficulty performing the following activities:  ?Hearing? 0  ?Vision? 0  ?Difficulty concentrating or making decisions? 0  ?Walking or climbing stairs? 0  ?Dressing or bathing? 0  ?Doing errands, shopping? 0  ?Preparing Food and eating ? N  ?Using the Toilet? N  ?In the past six months, have you accidently leaked urine? N  ?Do you have problems with loss of bowel control? N  ?Managing your Medications? N  ?Managing your Finances? N  ?Housekeeping or managing your Housekeeping? N  ? ? ?Patient Care Team: ?Burnis Medin, MD as PCP - General ? ?Indicate any recent Medical Services you may have received from other than [facility] providers in the past year (date may be approximate). ? ?   ?Assessment:  ? This is a routine wellness examination for [facility]. ? ?Hearing/Vision screen ?Hearing Screening - Comments:: No hearing difficulty ?Vision Screening - Comments:: Wears glasses. Followed by Coralie Keens ? ?Dietary issues and exercise activities  discussed: ?Exercise limited by: None identified ? ? Goals Addressed   ? ?  ?  ?  ?  ?  ? This Visit's Progress  ?   Exercise 3x per week (30 min per time) (pt-stated)     ?   Swimming 1 hour 2 times a week. ?  ? ?  ? ?Depression Screen ? ?  03/30/2022  ? 10:57 AM 10/28/2021  ?  3:02 PM 03/10/2021  ? 12:49 PM 03/10/2021  ? 12:40 PM 01/04/2019  ?  9:16 AM 10/04/2013  ?  1:35 PM  ?  PHQ 2/9 Scores  ?PHQ - 2 Score 0 1 0 0 0 0  ?PHQ- 9 Score  9      ?  ?Fall Risk ? ?  03/30/2022  ? 11:00 AM 03/10/2021  ? 12:45 PM 10/04/2013  ?  1:35 PM  ?Fall Risk   ?Falls in the past year? 1 0 No  ?Number falls in past yr: 0 0   ?Injury with Fall? 0 0   ?Comment No injury or medical attention needed    ?Risk for fall due to : No Fall Risks    ?Follow up  Falls evaluation completed   ? ? ?FALL RISK PREVENTION PERTAINING TO THE HOME: ? ?Any stairs in or around the home? No  ?If so, are there any without handrails? No  ?Home free of loose throw rugs in walkways, pet beds, electrical cords, etc? Yes  ?Adequate lighting in your home to reduce risk of falls? Yes  ? ?ASSISTIVE DEVICES UTILIZED TO PREVENT FALLS: ? ?Life alert? No  ?Use of a cane, walker or w/c? No  ?Grab bars in the bathroom? Yes  ?Shower chair or bench in shower? No  ?Elevated toilet seat or a handicapped toilet? Yes  ? ?TIMED UP AND GO: ? ?Was the test performed? No . Audio Visit ? ?Cognitive Function: ?  ? ?  03/30/2022  ? 11:02 AM  ?6CIT Screen  ?What Year? 0 points  ?What month? 0 points  ?What time? 0 points  ?Count back from 20 0 points  ?Months in reverse 0 points  ?Repeat phrase 0 points  ?Total Score 0 points  ? ? ?Immunizations ?Immunization History  ?Administered Date(s) Administered  ? Fluad Quad(high Dose 65+) 08/10/2019  ? Hepatitis A, Adult 10/03/2018  ? Influenza Split 09/05/2013  ? Influenza Whole 10/13/2007, 09/18/2008, 09/23/2009, 10/06/2010  ? Influenza, High Dose Seasonal PF 09/14/2018  ? Influenza,inj,Quad PF,6+ Mos 07/29/2016, 09/02/2017  ? Moderna Sars-Covid-2  Vaccination 01/05/2020, 02/06/2020, 10/04/2020, 05/08/2021  ? Pneumococcal Conjugate-13 10/03/2018  ? Pneumococcal Polysaccharide-23 10/28/2021  ? Td 04/11/2008, 10/03/2018  ? Zoster Recombinat (Shingrix) 09/02/2017, 12/07

## 2022-03-30 NOTE — Patient Instructions (Addendum)
?[name] , ?Thank you for taking time to come for your Medicare Wellness Visit. I appreciate your ongoing commitment to your health goals. Please review the following plan we discussed and let me know if I can assist you in the future.  ? ?These are the goals we discussed: ? Goals   ? ?   Exercise 3x per week (30 min per time) (pt-stated)   ?   Swimming 1 hour 2 times a week. ?  ? ?  ?  ?This is a list of the screening recommended for you and due dates:  ?Health Maintenance  ?Topic Date Due  ? COVID-19 Vaccine (5 - Booster for Moderna series) 04/15/2022*  ? Hepatitis C Screening: USPSTF Recommendation to screen - Ages 18-79 yo.  03/31/2023*  ? Flu Shot  07/06/2022  ? Mammogram  06/17/2023  ? Colon Cancer Screening  04/07/2025  ? Tetanus Vaccine  10/03/2028  ? Pneumonia Vaccine  Completed  ? DEXA scan (bone density measurement)  Completed  ? Zoster (Shingles) Vaccine  Completed  ? HPV Vaccine  Aged Out  ?*Topic was postponed. The date shown is not the original due date.  ? ?Advanced directives: Yes Patient will submit copy ? ?Conditions/risks identified: None ? ?Next appointment: Follow up in one year for your annual wellness visit  ? ? ?Preventive Care 38 Years and Older, Female ?Preventive care refers to lifestyle choices and visits with your health care provider that can promote health and wellness. ?What does preventive care include? ?A yearly physical exam. This is also called an annual well check. ?Dental exams once or twice a year. ?Routine eye exams. Ask your health care provider how often you should have your eyes checked. ?Personal lifestyle choices, including: ?Daily care of your teeth and gums. ?Regular physical activity. ?Eating a healthy diet. ?Avoiding tobacco and drug use. ?Limiting alcohol use. ?Practicing safe sex. ?Taking low-dose aspirin every day. ?Taking vitamin and mineral supplements as recommended by your health care provider. ?What happens during an annual well check? ?The services and  screenings done by your health care provider during your annual well check will depend on your age, overall health, lifestyle risk factors, and family history of disease. ?Counseling  ?Your health care provider may ask you questions about your: ?Alcohol use. ?Tobacco use. ?Drug use. ?Emotional well-being. ?Home and relationship well-being. ?Sexual activity. ?Eating habits. ?History of falls. ?Memory and ability to understand (cognition). ?Work and work Statistician. ?Reproductive health. ?Screening  ?You may have the following tests or measurements: ?Height, weight, and BMI. ?Blood pressure. ?Lipid and cholesterol levels. These may be checked every 5 years, or more frequently if you are over 61 years old. ?Skin check. ?Lung cancer screening. You may have this screening every year starting at age 15 if you have a 30-pack-year history of smoking and currently smoke or have quit within the past 15 years. ?Fecal occult blood test (FOBT) of the stool. You may have this test every year starting at age 82. ?Flexible sigmoidoscopy or colonoscopy. You may have a sigmoidoscopy every 5 years or a colonoscopy every 10 years starting at age 60. ?Hepatitis C blood test. ?Hepatitis B blood test. ?Sexually transmitted disease (STD) testing. ?Diabetes screening. This is done by checking your blood sugar (glucose) after you have not eaten for a while (fasting). You may have this done every 1-3 years. ?Bone density scan. This is done to screen for osteoporosis. You may have this done starting at age 87. ?Mammogram. This may be done every  1-2 years. Talk to your health care provider about how often you should have regular mammograms. ?Talk with your health care provider about your test results, treatment options, and if necessary, the need for more tests. ?Vaccines  ?Your health care provider may recommend certain vaccines, such as: ?Influenza vaccine. This is recommended every year. ?Tetanus, diphtheria, and acellular pertussis (Tdap,  Td) vaccine. You may need a Td booster every 10 years. ?Zoster vaccine. You may need this after age 50. ?Pneumococcal 13-valent conjugate (PCV13) vaccine. One dose is recommended after age 75. ?Pneumococcal polysaccharide (PPSV23) vaccine. One dose is recommended after age 59. ?Talk to your health care provider about which screenings and vaccines you need and how often you need them. ?This information is not intended to replace advice given to you by your health care provider. Make sure you discuss any questions you have with your health care provider. ?Document Released: 12/19/2015 Document Revised: 08/11/2016 Document Reviewed: 09/23/2015 ?Elsevier Interactive Patient Education ? 2017 [place]. ? ?Fall Prevention in the Home ?Falls can cause injuries. They can happen to people of all ages. There are many things you can do to make your home safe and to help prevent falls. ?What can I do on the outside of my home? ?Regularly fix the edges of walkways and driveways and fix any cracks. ?Remove anything that might make you trip as you walk through a door, such as a raised step or threshold. ?Trim any bushes or trees on the path to your home. ?Use bright outdoor lighting. ?Clear any walking paths of anything that might make someone trip, such as rocks or tools. ?Regularly check to see if handrails are loose or broken. Make sure that both sides of any steps have handrails. ?Any raised decks and porches should have guardrails on the edges. ?Have any leaves, snow, or ice cleared regularly. ?Use sand or salt on walking paths during winter. ?Clean up any spills in your garage right away. This includes oil or grease spills. ?What can I do in the bathroom? ?Use night lights. ?Install grab bars by the toilet and in the tub and shower. Do not use towel bars as grab bars. ?Use non-skid mats or decals in the tub or shower. ?If you need to sit down in the shower, use a plastic, non-slip stool. ?Keep the floor dry. Clean up any  water that spills on the floor as soon as it happens. ?Remove soap buildup in the tub or shower regularly. ?Attach bath mats securely with double-sided non-slip rug tape. ?Do not have throw rugs and other things on the floor that can make you trip. ?What can I do in the bedroom? ?Use night lights. ?Make sure that you have a light by your bed that is easy to reach. ?Do not use any sheets or blankets that are too big for your bed. They should not hang down onto the floor. ?Have a firm chair that has side arms. You can use this for support while you get dressed. ?Do not have throw rugs and other things on the floor that can make you trip. ?What can I do in the kitchen? ?Clean up any spills right away. ?Avoid walking on wet floors. ?Keep items that you use a lot in easy-to-reach places. ?If you need to reach something above you, use a strong step stool that has a grab bar. ?Keep electrical cords out of the way. ?Do not use floor polish or wax that makes floors slippery. If you must use wax, use non-skid  floor wax. ?Do not have throw rugs and other things on the floor that can make you trip. ?What can I do with my stairs? ?Do not leave any items on the stairs. ?Make sure that there are handrails on both sides of the stairs and use them. Fix handrails that are broken or loose. Make sure that handrails are as long as the stairways. ?Check any carpeting to make sure that it is firmly attached to the stairs. Fix any carpet that is loose or worn. ?Avoid having throw rugs at the top or bottom of the stairs. If you do have throw rugs, attach them to the floor with carpet tape. ?Make sure that you have a light switch at the top of the stairs and the bottom of the stairs. If you do not have them, ask someone to add them for you. ?What else can I do to help prevent falls? ?Wear shoes that: ?Do not have high heels. ?Have rubber bottoms. ?Are comfortable and fit you well. ?Are closed at the toe. Do not wear sandals. ?If you use a  stepladder: ?Make sure that it is fully opened. Do not climb a closed stepladder. ?Make sure that both sides of the stepladder are locked into place. ?Ask someone to hold it for you, if possible. ?Clear

## 2022-03-31 NOTE — Progress Notes (Signed)
?Charlann Boxer D.O. ?[facility] ?[place] ?Phone: [phone] ?Subjective:   ?I, Natasha [name], am serving as a Education administrator for Dr. Hulan Saas. ?This visit occurred during the SARS-CoV-2 public health emergency.  Safety protocols were in place, including screening questions prior to the visit, additional usage of staff PPE, and extensive cleaning of exam room while observing appropriate contact time as indicated for disinfecting solutions.  ? ?I'm seeing this patient by the request  of:  Panosh, Standley Brooking, MD ? ?CC: back and shoulder pain  ? ?[id number]  ?12/15/2021 ?Significant improvement after the injection.  No change in management. ? ?Patient does have underlying back pain and we will continue to monitor.  Continue prednisone for breakthrough as well as epidurals which has helped tremendously in the past. ? ?Patient overall is doing well.  Discussed icing regimen and home exercises.  Discussed which activities to do which wants to avoid.  Increase activity slowly.  Follow-up again in 6 to 8 weeks ? ?Updated 04/01/2022 ?Natasha [name] is a 71 y.o. female coming in with complaint of knee, back, and shoulder pain. Knees are doing well. Went on trip and walked around. Felt good. Back also doing well. Occasional butt ache but doing well. Shoulder can still feel it, but its not waking her up. Doing well overall. No new complaints. ? ? ? ?  ? ?Past Medical History:  ?Diagnosis Date  ? Allergic rhinitis   ? Normal cardiac stress test   ? ?Past Surgical History:  ?Procedure Laterality Date  ? MYRINGOTOMY    ? right 7 th grade  ? ?Social History  ? ?Socioeconomic History  ? Marital status: Married  ?  Spouse name: Not on file  ? Number of children: Not on file  ? Years of education: Not on file  ? Highest education level: Not on file  ?Occupational History  ? Not on file  ?Tobacco Use  ? Smoking status: Never  ? Smokeless tobacco: Never  ?Vaping Use  ? Vaping Use: Never used   ?Substance and Sexual Activity  ? Alcohol use: Yes  ?  Alcohol/week: 1.0 standard drink  ?  Types: 1 Standard drinks or equivalent per week  ?  Comment: 1 every 2 weeks   ? Drug use: No  ? Sexual activity: Not on file  ?Other Topics Concern  ? Not on file  ?Social History Narrative  ? HH  of 3  ? Son adopted from [place] now 14 has FAS and microcephaly  ? 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   ? No tob  ? ?Social Determinants of Health  ? ?Financial Resource Strain: Low Risk   ? Difficulty of Paying Living Expenses: Not hard at all  ?Food Insecurity: No Food Insecurity  ? Worried About Charity fundraiser in the Last Year: Never true  ? Ran Out of Food in the Last Year: Never true  ?Transportation Needs: No Transportation Needs  ? Lack of Transportation (Medical): No  ? Lack of Transportation (Non-Medical): No  ?Physical Activity: Sufficiently Active  ? Days of Exercise per Week: 3 days  ? Minutes of Exercise per Session: 60 min  ?Stress: No Stress Concern Present  ? Feeling of Stress : Not at all  ?Social Connections: Socially Integrated  ? Frequency of Communication with Friends and Family: More than three times a week  ? Frequency of Social Gatherings with Friends and Family: More than three  times a week  ? Attends Religious Services: More than 4 times per year  ? Active Member of Clubs or Organizations: Yes  ? Attends Archivist Meetings: More than 4 times per year  ? Marital Status: Married  ? ?No Known Allergies ?Family History  ?Problem Relation Age of Onset  ? Lung disease Father   ? Cancer Father   ?     Colon  ? Colon polyps Father   ? Multiple sclerosis Mother   ? Scleroderma Sister   ? Parkinson's disease Maternal Grandmother   ? Colon cancer Neg Hx   ? Breast cancer Neg Hx   ? ? ?Current Outpatient Medications (Endocrine & Metabolic):  ?  predniSONE (DELTASONE) 20 MG tablet, Take 2 tablets (40 mg total) by mouth daily with breakfast. ? ? ? ? ? ?Current Outpatient  Medications (Other):  ?  Vitamin D, Ergocalciferol, (DRISDOL) 1.25 MG (50000 UNIT) CAPS capsule, TAKE 1 CAPSULE (50,000 UNITS TOTAL) BY MOUTH EVERY 7 (SEVEN) DAYS ? ? ? ?Review of Systems: ? No headache, visual changes, nausea, vomiting, diarrhea, constipation, dizziness, abdominal pain, skin rash, fevers, chills, night sweats, weight loss, swollen lymph nodes, body aches, joint swelling, chest pain, shortness of breath, mood changes. POSITIVE muscle aches but mild  ? ?Objective  ?Blood pressure 118/76, pulse 82, height '5\' 5"'$  (1.651 m), SpO2 97 %. ?  ?General: No apparent distress alert and oriented x3 mood and affect normal, dressed appropriately.  ?HEENT: Pupils equal, extraocular movements intact  ?Respiratory: Patient's speak in full sentences and does not appear short of breath  ?Cardiovascular: No lower extremity edema, non tender, no erythema  ?Gait normal with good balance and coordination.  ?MSK: Arthritic changes of multiple joints noted.  Patient is doing really well overall.  Mild loss of lordosis.  Patient is sitting very comfortably and is in a good mood ? ?  ?Impression and Recommendations:  ?  ? ?The above documentation has been reviewed and is accurate and complete Lyndal Pulley, DO ? ? ? ?

## 2022-04-01 ENCOUNTER — Ambulatory Visit: Payer: Medicare HMO | Admitting: Family Medicine

## 2022-04-01 VITALS — BP 118/76 | HR 82 | Ht 65.0 in

## 2022-04-01 DIAGNOSIS — M48062 Spinal stenosis, lumbar region with neurogenic claudication: Secondary | ICD-10-CM | POA: Diagnosis not present

## 2022-04-01 DIAGNOSIS — M170 Bilateral primary osteoarthritis of knee: Secondary | ICD-10-CM | POA: Diagnosis not present

## 2022-04-01 DIAGNOSIS — M19011 Primary osteoarthritis, right shoulder: Secondary | ICD-10-CM | POA: Diagnosis not present

## 2022-04-01 DIAGNOSIS — M7551 Bursitis of right shoulder: Secondary | ICD-10-CM | POA: Diagnosis not present

## 2022-04-01 MED ORDER — PREDNISONE 20 MG PO TABS
40.0000 mg | ORAL_TABLET | Freq: Every day | ORAL | 0 refills | Status: DC
Start: 2022-04-01 — End: 2022-08-24

## 2022-04-01 NOTE — Assessment & Plan Note (Signed)
Stable no changes in management at this time. ?

## 2022-04-01 NOTE — Assessment & Plan Note (Signed)
So much better overall, discussed HEP  ?Discussed which activities ?Patient at this point I think is going to be fine.  Given prednisone again in case she needs it for any of her traveling.  Follow-up with me again in 2 to 3 months before her next overseas trip. ?

## 2022-04-01 NOTE — Patient Instructions (Signed)
Prednisone refill ?See you again in 2-3 months before [place] trip ?

## 2022-04-07 DIAGNOSIS — H903 Sensorineural hearing loss, bilateral: Secondary | ICD-10-CM | POA: Diagnosis not present

## 2022-06-21 NOTE — Progress Notes (Unsigned)
Zach Takenya Travaglini [place] [address] [place] Phone: [phone] Subjective:   [name], am serving as a scribe for Dr. Hulan Saas.  I'm seeing this patient by the request  of:  Panosh, Standley Brooking, MD  CC: Back pain, knee pain follow-up  [id number]  12/15/2021 Patient overall is doing well.  Discussed icing regimen and home exercises.  Discussed which activities to do which wants to avoid.  Increase activity slowly.  Follow-up again in 6 to 8 weeks  Patient does have underlying back pain and we will continue to monitor.  Continue prednisone for breakthrough as well as epidurals which has helped tremendously in the past.  Significant improvement after the injection.  No change in management.  Updated 06/22/2022 VERLEY PARISEAU is a 71 y.o. female coming in with complaint of bilateral knee, back, and right shoulder pain. The knees were doing well up until a week ago. Sciatica pain is constant at a 4/5 out of 10. Pain is all on right side. Shoulder is also doing well. Hips do hurt as well.       Past Medical History:  Diagnosis Date   Allergic rhinitis    Normal cardiac stress test    Past Surgical History:  Procedure Laterality Date   MYRINGOTOMY     right 7 th grade   Social History   Socioeconomic History   Marital status: Married    Spouse name: Not on file   Number of children: Not on file   Years of education: Not on file   Highest education level: Not on file  Occupational History   Not on file  Tobacco Use   Smoking status: Never   Smokeless tobacco: Never  Vaping Use   Vaping Use: Never used  Substance and Sexual Activity   Alcohol use: Yes    Alcohol/week: 1.0 standard drink of alcohol    Types: 1 Standard drinks or equivalent per week    Comment: 1 every 2 weeks    Drug use: No   Sexual activity: Not on file  Other Topics Concern   Not on file  Social History Narrative   HH  of 3   Son adopted from  [place] now 24 has FAS and microcephaly   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    No tob   Social Determinants of Health   Financial Resource Strain: Low Risk  (03/30/2022)   Overall Financial Resource Strain (CARDIA)    Difficulty of Paying Living Expenses: Not hard at all  Food Insecurity: No Food Insecurity (03/30/2022)   Hunger Vital Sign    Worried About Running Out of Food in the Last Year: Never true    Ran Out of Food in the Last Year: Never true  Transportation Needs: No Transportation Needs (03/30/2022)   PRAPARE - Hydrologist (Medical): No    Lack of Transportation (Non-Medical): No  Physical Activity: Sufficiently Active (03/30/2022)   Exercise Vital Sign    Days of Exercise per Week: 3 days    Minutes of Exercise per Session: 60 min  Stress: No Stress Concern Present (03/30/2022)   [place]    Feeling of Stress : Not at all  Social Connections: [place] (03/30/2022)   Social Connection and Isolation Panel [NHANES]    Frequency of Communication with Friends and Family: More than three times a week  Frequency of Social Gatherings with Friends and Family: More than three times a week    Attends Religious Services: More than 4 times per year    Active Member of Clubs or Organizations: Yes    Attends Music therapist: More than 4 times per year    Marital Status: Married   No Known Allergies Family History  Problem Relation Age of Onset   Lung disease Father    Cancer Father        Colon   Colon polyps Father    Multiple sclerosis Mother    Scleroderma Sister    Parkinson's disease Maternal Grandmother    Colon cancer Neg Hx    Breast cancer Neg Hx     Current Outpatient Medications (Endocrine & Metabolic):    predniSONE (DELTASONE) 20 MG tablet, Take 2 tablets (40 mg total) by mouth daily with breakfast.      Current  Outpatient Medications (Other):    Vitamin D, Ergocalciferol, (DRISDOL) 1.25 MG (50000 UNIT) CAPS capsule, TAKE 1 CAPSULE (50,000 UNITS TOTAL) BY MOUTH EVERY 7 (SEVEN) DAYS   Reviewed prior external information including notes and imaging from  primary care provider As well as notes that were available from care everywhere and other healthcare systems.  Past medical history, social, surgical and family history all reviewed in electronic medical record.  No pertanent information unless stated regarding to the chief complaint.   Review of Systems:  No headache, visual changes, nausea, vomiting, diarrhea, constipation, dizziness, abdominal pain, skin rash, fevers, chills, night sweats, weight loss, swollen lymph nodes,, joint swelling, chest pain, shortness of breath, mood changes. POSITIVE muscle aches, body aches  Objective  Blood pressure 118/78, pulse 82, height '5\' 5"'$  (1.651 m), SpO2 97 %.   General: No apparent distress alert and oriented x3 mood and affect normal, dressed appropriately.  HEENT: Pupils equal, extraocular movements intact  Respiratory: Patient's speak in full sentences and does not appear short of breath  Cardiovascular: No lower extremity edema, non tender, no erythema  Back exam does have some loss of lordosis.  Patient does have a positive straight leg test on the left side.  Tender to palpation laterally over the greater trochanteric area on the left side.  Tightness with FABER test bilaterally.    Impression and Recommendations:     The above documentation has been reviewed and is accurate and complete Lyndal Pulley, DO

## 2022-06-22 ENCOUNTER — Ambulatory Visit (INDEPENDENT_AMBULATORY_CARE_PROVIDER_SITE_OTHER): Payer: Medicare HMO

## 2022-06-22 ENCOUNTER — Ambulatory Visit (INDEPENDENT_AMBULATORY_CARE_PROVIDER_SITE_OTHER): Payer: Medicare HMO | Admitting: Family Medicine

## 2022-06-22 VITALS — BP 118/78 | HR 82 | Ht 65.0 in

## 2022-06-22 DIAGNOSIS — M545 Low back pain, unspecified: Secondary | ICD-10-CM | POA: Diagnosis not present

## 2022-06-22 DIAGNOSIS — M48062 Spinal stenosis, lumbar region with neurogenic claudication: Secondary | ICD-10-CM | POA: Diagnosis not present

## 2022-06-22 DIAGNOSIS — M5136 Other intervertebral disc degeneration, lumbar region: Secondary | ICD-10-CM | POA: Diagnosis not present

## 2022-06-22 NOTE — Assessment & Plan Note (Signed)
Known spinal stenosis.  Worsening symptoms at the moment.  Discussed that with the worsening symptoms and radiculopathy we should consider the possibility of advanced imaging.  Patient though is traveling and wants to avoid any imaging at the moment.  Patient does take the prednisone intermittently.  We discussed the potential for Toradol or Depo-Medrol.  Patient thinks that she may come back before the trip but does not know for sure.  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

## 2022-06-22 NOTE — Patient Instructions (Signed)
Xray today Good to see you! Appointment for cocktail next week (double) I like the plan otherwise See you again in September for follow up

## 2022-06-23 NOTE — Progress Notes (Signed)
Natasha [name] Phone: [phone] Subjective:   [name], am serving as a scribe for Dr. Hulan Saas.   I'm seeing this patient by the request  of:  Panosh, Standley Brooking, MD  CC: Back pain follow-up  [id number]  Natasha [name] is a 71 y.o. female coming in with complaint of lumbar spine pain. Patient here for cocktail injection. Patient states that she is the same as last weekend. She continues to have some discomfort as well.  Patient states that has not been able to have a breakthrough    Past Medical History:  Diagnosis Date   Allergic rhinitis    Normal cardiac stress test    Past Surgical History:  Procedure Laterality Date   MYRINGOTOMY     right 7 th grade   Social History   Socioeconomic History   Marital status: Married    Spouse name: Not on file   Number of children: Not on file   Years of education: Not on file   Highest education level: Not on file  Occupational History   Not on file  Tobacco Use   Smoking status: Never   Smokeless tobacco: Never  Vaping Use   Vaping Use: Never used  Substance and Sexual Activity   Alcohol use: Yes    Alcohol/week: 1.0 standard drink of alcohol    Types: 1 Standard drinks or equivalent per week    Comment: 1 every 2 weeks    Drug use: No   Sexual activity: Not on file  Other Topics Concern   Not on file  Social History Narrative   HH  of 3   Son adopted from [place] now 73 has FAS and microcephaly   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    No tob   Social Determinants of Health   Financial Resource Strain: Low Risk  (03/30/2022)   Overall Financial Resource Strain (CARDIA)    Difficulty of Paying Living Expenses: Not hard at all  Food Insecurity: No Food Insecurity (03/30/2022)   Hunger Vital Sign    Worried About Running Out of Food in the Last Year: Never true    Ran Out of Food in the Last Year: Never true   Transportation Needs: No Transportation Needs (03/30/2022)   PRAPARE - Hydrologist (Medical): No    Lack of Transportation (Non-Medical): No  Physical Activity: Sufficiently Active (03/30/2022)   Exercise Vital Sign    Days of Exercise per Week: 3 days    Minutes of Exercise per Session: 60 min  Stress: No Stress Concern Present (03/30/2022)   [place]    Feeling of Stress : Not at all  Social Connections: [place] (03/30/2022)   Social Connection and Isolation Panel [NHANES]    Frequency of Communication with Friends and Family: More than three times a week    Frequency of Social Gatherings with Friends and Family: More than three times a week    Attends Religious Services: More than 4 times per year    Active Member of [employer] or Organizations: Yes    Attends Music therapist: More than 4 times per year    Marital Status: Married   No Known Allergies Family History  Problem Relation Age of Onset   Lung disease Father    Cancer Father  Colon   Colon polyps Father    Multiple sclerosis Mother    Scleroderma Sister    Parkinson's disease Maternal Grandmother    Colon cancer Neg Hx    Breast cancer Neg Hx     Current Outpatient Medications (Endocrine & Metabolic):    predniSONE (DELTASONE) 20 MG tablet, Take 2 tablets (40 mg total) by mouth daily with breakfast.      Current Outpatient Medications (Other):    Vitamin D, Ergocalciferol, (DRISDOL) 1.25 MG (50000 UNIT) CAPS capsule, TAKE 1 CAPSULE (50,000 UNITS TOTAL) BY MOUTH EVERY 7 (SEVEN) DAYS   Reviewed prior external information including notes and imaging from  primary care provider As well as notes that were available from care everywhere and other healthcare systems.  Past medical history, social, surgical and family history all reviewed in electronic medical record.  No pertanent information  unless stated regarding to the chief complaint.   Review of Systems:  No headache, visual changes, nausea, vomiting, diarrhea, constipation, dizziness, abdominal pain, skin rash, fevers, chills, night sweats, weight loss, swollen lymph nodes,  joint swelling, chest pain, shortness of breath, mood changes. POSITIVE muscle aches, body aches  Objective  Blood pressure 118/86, pulse 78, height '5\' 5"'$  (1.651 m), SpO2 96 %.   General: No apparent distress alert and oriented x3 mood and affect normal, dressed appropriately.  HEENT: Pupils equal, extraocular movements intact  Respiratory: Patient's speak in full sentences and does not appear short of breath  Cardiovascular: No lower extremity edema, non tender, no erythema  Low back does have some loss of lordosis.  Still has some antalgic gait favoring the knee.  Tender to palpation noted.    Impression and Recommendations:     The above documentation has been reviewed and is accurate and complete Natasha Pulley, DO

## 2022-06-29 ENCOUNTER — Ambulatory Visit (INDEPENDENT_AMBULATORY_CARE_PROVIDER_SITE_OTHER): Payer: Medicare HMO | Admitting: Family Medicine

## 2022-06-29 VITALS — BP 118/86 | HR 78 | Ht 65.0 in

## 2022-06-29 DIAGNOSIS — M5416 Radiculopathy, lumbar region: Secondary | ICD-10-CM

## 2022-06-29 DIAGNOSIS — M2550 Pain in unspecified joint: Secondary | ICD-10-CM | POA: Diagnosis not present

## 2022-06-29 MED ORDER — KETOROLAC TROMETHAMINE 60 MG/2ML IM SOLN
60.0000 mg | Freq: Once | INTRAMUSCULAR | Status: AC
  Administered 2022-06-29: 60 mg via INTRAMUSCULAR

## 2022-06-29 MED ORDER — METHYLPREDNISOLONE ACETATE 80 MG/ML IJ SUSP
80.0000 mg | Freq: Once | INTRAMUSCULAR | Status: AC
  Administered 2022-06-29: 80 mg via INTRAMUSCULAR

## 2022-06-29 NOTE — Assessment & Plan Note (Signed)
Patient has chronic problem with exacerbation again.  Has responded well to the Toradol methylprednisolone previously.  Patient is going to be traveling.  Hopefullywill be beneficial.  Follow-up with me again in 6 to 8 weeks.  Patient has prednisone for breakthrough pain as well.

## 2022-08-19 DIAGNOSIS — Z1231 Encounter for screening mammogram for malignant neoplasm of breast: Secondary | ICD-10-CM | POA: Diagnosis not present

## 2022-08-19 LAB — HM MAMMOGRAPHY

## 2022-08-19 NOTE — Progress Notes (Unsigned)
Natasha [name] [address] [place] Phone: [phone] Subjective:   IVilma [name], am serving as a scribe for Dr. Hulan Saas.  I'm seeing this patient by the request  of:  Panosh, Standley Brooking, MD  CC: right low back pain f/u   Natasha [name]:[id number]  06/29/2022 Patient has chronic problem with exacerbation again.  Has responded well to the Toradol methylprednisolone previously.  Patient is going to be traveling.  Hopefullywill be beneficial.  Follow-up with me again in 6 to 8 weeks.  Patient has prednisone for breakthrough pain as well  Update 08/24/2022 ROSAISELA [name] is a 71 y.o. female coming in with complaint of R lumbar spine pain. Patient states cramps in legs and feet are becoming more problematic. Can be a 10/10 on pain scale. For all of her aches and pains Tylenol arthritis extended release has been working well. She took the prednisone on her trip to [place] and that helped. Would like a refill for another upcoming trip      Past Medical History:  Diagnosis Date   Allergic rhinitis    Normal cardiac stress test    Past Surgical History:  Procedure Laterality Date   MYRINGOTOMY     right 7 th grade   Social History   Socioeconomic History   Marital status: Married    Spouse name: Not on file   Number of children: Not on file   Years of education: Not on file   Highest education level: Not on file  Occupational History   Not on file  Tobacco Use   Smoking status: Never   Smokeless tobacco: Never  Vaping Use   Vaping Use: Never used  Substance and Sexual Activity   Alcohol use: Yes    Alcohol/week: 1.0 standard drink of alcohol    Types: 1 Standard drinks or equivalent per week    Comment: 1 every 2 weeks    Drug use: No   Sexual activity: Not on file  Other Topics Concern   Not on file  Social History Narrative   HH  of 3   Son adopted from [place] now 65 has FAS and microcephaly   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    No tob   Social Determinants of Health   Financial Resource Strain: Low Risk  (03/30/2022)   Overall Financial Resource Strain (CARDIA)    Difficulty of Paying Living Expenses: Not hard at all  Food Insecurity: No Food Insecurity (03/30/2022)   Hunger Vital Sign    Worried About Running Out of Food in the Last Year: Never true    Ran Out of Food in the Last Year: Never true  Transportation Needs: No Transportation Needs (03/30/2022)   PRAPARE - Hydrologist (Medical): No    Lack of Transportation (Non-Medical): No  Physical Activity: Sufficiently Active (03/30/2022)   Exercise Vital Sign    Days of Exercise per Week: 3 days    Minutes of Exercise per Session: 60 min  Stress: No Stress Concern Present (03/30/2022)   [place]    Feeling of Stress : Not at all  Social Connections: [place] (03/30/2022)   Social Connection and Isolation Panel [NHANES]    Frequency of Communication with Friends and Family: More than three times a week    Frequency of Social Gatherings with Friends and Family: More than three  times a week    Attends Religious Services: More than 4 times per year    Active Member of Clubs or Organizations: Yes    Attends Music therapist: More than 4 times per year    Marital Status: Married   No Known Allergies Family History  Problem Relation Age of Onset   Lung disease Father    Cancer Father        Colon   Colon polyps Father    Multiple sclerosis Mother    Scleroderma Sister    Parkinson's disease Maternal Grandmother    Colon cancer Neg Hx    Breast cancer Neg Hx     Current Outpatient Medications (Endocrine & Metabolic):    predniSONE (DELTASONE) 20 MG tablet, Take 2 tablets (40 mg total) by mouth daily with breakfast.      Current Outpatient Medications (Other):    Vitamin D, Ergocalciferol, (DRISDOL)  1.25 MG (50000 UNIT) CAPS capsule, TAKE 1 CAPSULE (50,000 UNITS TOTAL) BY MOUTH EVERY 7 (SEVEN) DAYS   Reviewed prior external information including notes and imaging from  primary care provider As well as notes that were available from care everywhere and other healthcare systems.  Past medical history, social, surgical and family history all reviewed in electronic medical record.  No pertanent information unless stated regarding to the chief complaint.   Review of Systems:  No headache, visual changes, nausea, vomiting, diarrhea, constipation, dizziness, abdominal pain, skin rash, fevers, chills, night sweats, weight loss, swollen lymph nodes, body aches, joint swelling, chest pain, shortness of breath, mood changes. POSITIVE muscle aches  Objective  Blood pressure 128/82, pulse 84, height '5\' 5"'$  (1.651 m), SpO2 94 %.   General: No apparent distress alert and oriented x3 mood and affect normal, dressed appropriately.  HEENT: Pupils equal, extraocular movements intact  Respiratory: Patient's speak in full sentences and does not appear short of breath  Cardiovascular: No lower extremity edema, non tender, no erythema  Patient does have some loss lordosis of the back.  Patient still has some of the radicular symptoms with straight leg test on the right greater than left.  Difficulty with FABER test noted.  Arthritic changes with some instability of the knees bilaterally.    Impression and Recommendations:     The above documentation has been reviewed and is accurate and complete Lyndal Pulley, DO

## 2022-08-23 DIAGNOSIS — H524 Presbyopia: Secondary | ICD-10-CM | POA: Diagnosis not present

## 2022-08-24 ENCOUNTER — Ambulatory Visit: Payer: Medicare HMO | Admitting: Family Medicine

## 2022-08-24 VITALS — BP 128/82 | HR 84 | Ht 65.0 in

## 2022-08-24 DIAGNOSIS — M5416 Radiculopathy, lumbar region: Secondary | ICD-10-CM | POA: Diagnosis not present

## 2022-08-24 DIAGNOSIS — M48062 Spinal stenosis, lumbar region with neurogenic claudication: Secondary | ICD-10-CM | POA: Diagnosis not present

## 2022-08-24 MED ORDER — PREDNISONE 20 MG PO TABS: 40.0000 mg | ORAL_TABLET | Freq: Every day | ORAL | 0 refills | Status: DC

## 2022-08-24 NOTE — Patient Instructions (Addendum)
[facility]  ([facility]) [address] [place], [place] [address] [phone] Oct 3rd 10:30am  Neurology referral  [place] [phone] Call Today  When we receive your results we will contact you.  Prednisone Refilled

## 2022-08-24 NOTE — Assessment & Plan Note (Signed)
Patient continues to have right greater than left radiculopathy but is having more cramping especially at night in the legs.  We have done laboratory work-up that was fairly unremarkable.  Concerned that this could be more vascular versus neurogenic.  I do feel at this point MRI of the lumbar spine is necessary, also a ABI to further evaluate for any vascular compromise that could be contributing as well as then a nerve conduction study to see if this is more peripheral versus centrally located.  Depending on findings patient would consider the possibility of surgical intervention if needed or possible injections.  Follow-up after all the testing is done to discuss further.  Refilled prednisone for patient's next travels.

## 2022-08-25 DIAGNOSIS — Z0100 Encounter for examination of eyes and vision without abnormal findings: Secondary | ICD-10-CM | POA: Diagnosis not present

## 2022-08-30 ENCOUNTER — Encounter: Payer: Self-pay | Admitting: Neurology

## 2022-08-30 ENCOUNTER — Other Ambulatory Visit: Payer: Self-pay

## 2022-08-30 DIAGNOSIS — R202 Paresthesia of skin: Secondary | ICD-10-CM

## 2022-08-31 ENCOUNTER — Other Ambulatory Visit: Payer: Self-pay | Admitting: Family Medicine

## 2022-08-31 DIAGNOSIS — M5416 Radiculopathy, lumbar region: Secondary | ICD-10-CM

## 2022-09-07 ENCOUNTER — Ambulatory Visit (HOSPITAL_COMMUNITY)
Admission: RE | Admit: 2022-09-07 | Discharge: 2022-09-07 | Disposition: A | Discharge status: Home / Self Care | Payer: Medicare HMO | Source: Ambulatory Visit | Attending: Cardiology | Admitting: Cardiology

## 2022-09-07 DIAGNOSIS — M5416 Radiculopathy, lumbar region: Secondary | ICD-10-CM

## 2022-09-07 DIAGNOSIS — R252 Cramp and spasm: Secondary | ICD-10-CM | POA: Diagnosis not present

## 2022-09-17 ENCOUNTER — Ambulatory Visit
Admission: RE | Admit: 2022-09-17 | Discharge: 2022-09-17 | Disposition: A | Discharge status: Home / Self Care | Payer: Medicare HMO | Source: Ambulatory Visit | Attending: Family Medicine | Admitting: Family Medicine

## 2022-09-17 DIAGNOSIS — M4316 Spondylolisthesis, lumbar region: Secondary | ICD-10-CM | POA: Diagnosis not present

## 2022-09-17 DIAGNOSIS — M48061 Spinal stenosis, lumbar region without neurogenic claudication: Secondary | ICD-10-CM | POA: Diagnosis not present

## 2022-09-17 DIAGNOSIS — M545 Low back pain, unspecified: Secondary | ICD-10-CM | POA: Diagnosis not present

## 2022-09-17 DIAGNOSIS — M5416 Radiculopathy, lumbar region: Secondary | ICD-10-CM

## 2022-09-22 ENCOUNTER — Encounter: Payer: Self-pay | Admitting: Internal Medicine

## 2022-09-22 ENCOUNTER — Encounter: Payer: Self-pay | Admitting: Family Medicine

## 2022-09-22 NOTE — Progress Notes (Signed)
[facility] 

## 2022-09-23 ENCOUNTER — Other Ambulatory Visit: Payer: Self-pay

## 2022-09-23 DIAGNOSIS — M5459 Other low back pain: Secondary | ICD-10-CM

## 2022-10-01 ENCOUNTER — Other Ambulatory Visit: Payer: Self-pay | Admitting: Family Medicine

## 2022-10-01 DIAGNOSIS — M5459 Other low back pain: Secondary | ICD-10-CM

## 2022-10-05 ENCOUNTER — Ambulatory Visit: Payer: Medicare HMO | Admitting: Neurology

## 2022-10-05 DIAGNOSIS — R202 Paresthesia of skin: Secondary | ICD-10-CM

## 2022-10-05 NOTE — Procedures (Signed)
  [facility]  [place]  [place], [place] [address] Tel: [phone] Fax: [phone] Test Date:  10/05/2022  Patient: Natasha [name] DOB: [birth year] Physician: Narda Amber, DO  Sex: Female Height: '5\' 5"'$  Ref Phys: Hulan Saas, DO  ID#: [id number]   Technician:    History: This is a 71 year old female referred for evaluation of bilateral leg pain.  NCV & EMG Findings: Electrodiagnostic testing was prematurely terminated at patient's request due to pain.  Limited findings show normal right superficial peroneal sensory response.    Impression: This is an incomplete study, as testing was terminated due to pain.   ___________________________ Narda Amber, DO    Nerve Conduction Studies   Stim Site NR Peak (ms) Norm Peak (ms) O-P Amp (V) Norm O-P Amp  Right Sup Peroneal Anti Sensory (Ant Lat Mall)  32 C  12 cm    2.1 <4.6 7.4 >3      Waveforms:

## 2022-10-06 ENCOUNTER — Telehealth: Payer: Self-pay | Admitting: Family Medicine

## 2022-10-06 NOTE — Telephone Encounter (Signed)
Peer to peer is set up for Monday the 6th at 12:15 with Dr. Tamala Julian

## 2022-10-06 NOTE — Telephone Encounter (Signed)
Per Anderson Malta at [facility], pt insurance will need Peer to Peer for injection order. She has sent notes but they are stating pain is not radiating. Documents scanned into Epic/Financial Correspondence with a Nov 6 deadline.

## 2022-10-21 NOTE — Progress Notes (Signed)
Natasha [name] Phone: [phone] Subjective:    I'm seeing this patient by the request  of:  Panosh, Standley Brooking, MD  CC: right low back pain   HAL:[id number]  08/24/2022 Patient continues to have right greater than left radiculopathy but is having more cramping especially at night in the legs.  We have done laboratory work-up that was fairly unremarkable.  Concerned that this could be more vascular versus neurogenic.  I do feel at this point MRI of the lumbar spine is necessary, also a ABI to further evaluate for any vascular compromise that could be contributing as well as then a nerve conduction study to see if this is more peripheral versus centrally located.  Depending on findings patient would consider the possibility of surgical intervention if needed or possible injections.  Follow-up after all the testing is done to discuss further.  Refilled prednisone for patient's next travels.   Update 10/25/2022 Natasha [name] is a 71 y.o. female coming in with complaint of R lumbar spine pain. Patient states that she is okay     IMPRESSION: 1. Transitional anatomy, with a partially lumbarized S1. Please correlate with imaging if any intervention is planned. 2. L4-L5 mild spinal canal stenosis. 3. L5-S1 narrowing of the lateral recesses, which could affect the descending S1 nerve roots. 4. Multilevel facet arthropathy, which is severe at L5-S1 and moderate to severe at L4-L5, which can be a cause of back pain. 5. Again noted is T2 hyperintense signal in the left sacral ala, which could represent an atypical hemangioma, but is incompletely imaged and evaluated. Consider a dedicated sacrum MRI for better characterization.     Past Medical History:  Diagnosis Date   Allergic rhinitis    Normal cardiac stress test    Past Surgical History:  Procedure Laterality Date   MYRINGOTOMY     right 7 th grade   Social History    Socioeconomic History   Marital status: Married    Spouse name: Not on file   Number of children: Not on file   Years of education: Not on file   Highest education level: Not on file  Occupational History   Not on file  Tobacco Use   Smoking status: Never   Smokeless tobacco: Never  Vaping Use   Vaping Use: Never used  Substance and Sexual Activity   Alcohol use: Yes    Alcohol/week: 1.0 standard drink of alcohol    Types: 1 Standard drinks or equivalent per week    Comment: 1 every 2 weeks    Drug use: No   Sexual activity: Not on file  Other Topics Concern   Not on file  Social History Narrative   HH  of 3   Son adopted from [place] now 37 has FAS and microcephaly   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    No tob   Social Determinants of Health   Financial Resource Strain: Low Risk  (03/30/2022)   Overall Financial Resource Strain (CARDIA)    Difficulty of Paying Living Expenses: Not hard at all  Food Insecurity: No Food Insecurity (03/30/2022)   Hunger Vital Sign    Worried About Running Out of Food in the Last Year: Never true    Ran Out of Food in the Last Year: Never true  Transportation Needs: No Transportation Needs (03/30/2022)   PRAPARE - Transportation    Lack of Transportation (  Medical): No    Lack of Transportation (Non-Medical): No  Physical Activity: Sufficiently Active (03/30/2022)   Exercise Vital Sign    Days of Exercise per Week: 3 days    Minutes of Exercise per Session: 60 min  Stress: No Stress Concern Present (03/30/2022)   [place]    Feeling of Stress : Not at all  Social Connections: [place] (03/30/2022)   Social Connection and Isolation Panel [NHANES]    Frequency of Communication with Friends and Family: More than three times a week    Frequency of Social Gatherings with Friends and Family: More than three times a week    Attends Religious  Services: More than 4 times per year    Active Member of [employer] or Organizations: Yes    Attends Music therapist: More than 4 times per year    Marital Status: Married   No Known Allergies Family History  Problem Relation Age of Onset   Lung disease Father    Cancer Father        Colon   Colon polyps Father    Multiple sclerosis Mother    Scleroderma Sister    Parkinson's disease Maternal Grandmother    Colon cancer Neg Hx    Breast cancer Neg Hx     Current Outpatient Medications (Endocrine & Metabolic):    predniSONE (DELTASONE) 20 MG tablet, Take 2 tablets (40 mg total) by mouth daily with breakfast.      Current Outpatient Medications (Other):    Vitamin D, Ergocalciferol, (DRISDOL) 1.25 MG (50000 UNIT) CAPS capsule, TAKE 1 CAPSULE (50,000 UNITS TOTAL) BY MOUTH EVERY 7 (SEVEN) DAYS   Reviewed prior external information including notes and imaging from  primary care provider As well as notes that were available from care everywhere and other healthcare systems.  Past medical history, social, surgical and family history all reviewed in electronic medical record.  No pertanent information unless stated regarding to the chief complaint.   Review of Systems:  No headache, visual changes, nausea, vomiting, diarrhea, constipation, dizziness, abdominal pain, skin rash, fevers, chills, night sweats, weight loss, swollen lymph nodes, body aches, joint swelling, chest pain, shortness of breath, mood changes. POSITIVE muscle aches  Objective  Blood pressure 110/78, pulse (!) 56, height '5\' 5"'$  (1.651 m), weight 222 lb (100.7 kg), SpO2 96 %.   General: No apparent distress alert and oriented x3 mood and affect normal, dressed appropriately.  HEENT: Pupils equal, extraocular movements intact  Respiratory: Patient's speak in full sentences and does not appear short of breath  Cardiovascular: No lower extremity edema, non tender, no erythema  Back exam does have some loss  of lordosis.  Some tenderness to palpation in the paraspinal musculature.   Sit comfortably    Impression and Recommendations:    The above documentation has been reviewed and is accurate and complete Lyndal Pulley, DO

## 2022-10-22 ENCOUNTER — Other Ambulatory Visit: Payer: Self-pay | Admitting: Family Medicine

## 2022-10-22 ENCOUNTER — Ambulatory Visit
Admission: RE | Admit: 2022-10-22 | Discharge: 2022-10-22 | Disposition: A | Discharge status: Home / Self Care | Payer: Medicare HMO | Source: Ambulatory Visit | Attending: Family Medicine | Admitting: Family Medicine

## 2022-10-22 DIAGNOSIS — G8929 Other chronic pain: Secondary | ICD-10-CM | POA: Diagnosis not present

## 2022-10-22 DIAGNOSIS — M5459 Other low back pain: Secondary | ICD-10-CM

## 2022-10-22 DIAGNOSIS — M545 Low back pain, unspecified: Secondary | ICD-10-CM | POA: Diagnosis not present

## 2022-10-22 NOTE — Discharge Instructions (Signed)

## 2022-10-25 ENCOUNTER — Ambulatory Visit (INDEPENDENT_AMBULATORY_CARE_PROVIDER_SITE_OTHER): Payer: Medicare HMO | Admitting: Family Medicine

## 2022-10-25 VITALS — BP 110/78 | HR 56 | Ht 65.0 in | Wt 222.0 lb

## 2022-10-25 DIAGNOSIS — M5416 Radiculopathy, lumbar region: Secondary | ICD-10-CM

## 2022-10-25 NOTE — Patient Instructions (Signed)
Great to see you  Safe travels  Can get prednisone when you need it  Have ablation in January  And see me 6 week after

## 2022-10-25 NOTE — Assessment & Plan Note (Addendum)
Responded well with nerve root injection and MBB Will get a injection for RFA when she returns from travels  Return to me 6 weeks after the injection.

## 2022-11-03 ENCOUNTER — Other Ambulatory Visit: Payer: Self-pay

## 2022-11-03 ENCOUNTER — Telehealth: Payer: Self-pay | Admitting: Family Medicine

## 2022-11-03 DIAGNOSIS — M5416 Radiculopathy, lumbar region: Secondary | ICD-10-CM

## 2022-11-03 NOTE — Telephone Encounter (Signed)
[facility] called stating that insurance has denied the ablation and is requiring her to have a second medial branch block.  Can this be ordered for her please?

## 2022-11-03 NOTE — Telephone Encounter (Signed)
Order placed

## 2022-11-05 ENCOUNTER — Encounter: Payer: Self-pay | Admitting: Family Medicine

## 2022-11-06 DIAGNOSIS — R059 Cough, unspecified: Secondary | ICD-10-CM | POA: Diagnosis not present

## 2022-11-06 DIAGNOSIS — R430 Anosmia: Secondary | ICD-10-CM | POA: Diagnosis not present

## 2022-11-06 DIAGNOSIS — J069 Acute upper respiratory infection, unspecified: Secondary | ICD-10-CM | POA: Diagnosis not present

## 2022-11-06 DIAGNOSIS — J189 Pneumonia, unspecified organism: Secondary | ICD-10-CM | POA: Diagnosis not present

## 2022-11-06 DIAGNOSIS — Z20822 Contact with and (suspected) exposure to covid-19: Secondary | ICD-10-CM | POA: Diagnosis not present

## 2022-11-12 DIAGNOSIS — R059 Cough, unspecified: Secondary | ICD-10-CM | POA: Diagnosis not present

## 2022-11-17 ENCOUNTER — Telehealth: Payer: Self-pay | Admitting: Family Medicine

## 2022-11-17 NOTE — Telephone Encounter (Signed)
Natasha [name] from [place] called, medial branch block has been denied by insurance. She will scan into EPIC.

## 2022-12-02 NOTE — Telephone Encounter (Signed)
Lmovm for pt to return call to see if she would still like to have this done.

## 2023-02-16 ENCOUNTER — Ambulatory Visit (INDEPENDENT_AMBULATORY_CARE_PROVIDER_SITE_OTHER): Payer: Medicare HMO | Admitting: Internal Medicine

## 2023-02-16 ENCOUNTER — Encounter: Payer: Self-pay | Admitting: Internal Medicine

## 2023-02-16 VITALS — BP 130/78 | HR 67 | Temp 97.4°F | Ht 65.0 in

## 2023-02-16 DIAGNOSIS — M1990 Unspecified osteoarthritis, unspecified site: Secondary | ICD-10-CM

## 2023-02-16 DIAGNOSIS — Z79899 Other long term (current) drug therapy: Secondary | ICD-10-CM

## 2023-02-16 DIAGNOSIS — Z6836 Body mass index (BMI) 36.0-36.9, adult: Secondary | ICD-10-CM | POA: Diagnosis not present

## 2023-02-16 DIAGNOSIS — M7918 Myalgia, other site: Secondary | ICD-10-CM | POA: Diagnosis not present

## 2023-02-16 NOTE — Progress Notes (Signed)
Chief Complaint  Patient presents with   Insomnia    Pt c/o having trouble to sleep and would like to discuss on medication   Joint Pain    Pt reports she was dx by her  sport medicine doctor, Dr. Tamala Julian with arthritis on knees, hip and shoulder. Taking prednisone as needed. Is helping but doesn't want to stay on it longer. Would like to discuss with Dr. Regis Bill on what medication she should takes.     HPI: Patient  Natasha [name]  72 y.o. comes in today for  discussion about ongoing ms arthritis sx . Has been under care Dr Tamala Julian  but  suboptimal response so far  and ask opinion .  Thinks prednisone is a wonder drug  makes her feel  great but then sx come back   Spinal stenosis radicular  Foot and leg cramps .   Also   Prednisone helps all joints  x PF>    "makes her feel like 27"  when on this   trying to limit.   Dosing 20 for about  vacaiton 10 days.   Currently trying Tylenol and tylenol pm  is this ok and not too much  risk to liver?  Health Maintenance  Topic Date Due   INFLUENZA VACCINE  07/06/2022   COVID-19 Vaccine (5 - 2023-24 season) 08/06/2022   Medicare Annual Wellness (AWV)  03/31/2023   Hepatitis C Screening  03/31/2023 (Originally 09/11/1969)   MAMMOGRAM  08/19/2024   COLONOSCOPY (Pts 45-49yrs Insurance coverage will need to be confirmed)  04/07/2025   DTaP/Tdap/Td (3 - Tdap) 10/03/2028   Pneumonia Vaccine 22+ Years old  Completed   DEXA SCAN  Completed   Zoster Vaccines- Shingrix  Completed   HPV VACCINES  Aged Out    ROS:  GEN/ HEENT: No fever, significant weight changes sweats headaches vision problems hearing changes, CV/ PULM; No chest pain shortness of breath cough, syncope,edema  change in exercise tolerance. GI /GU: No adominal pain, vomiting, change in bowel habits. No blood in the stool. No significant GU symptoms. SKIN/HEME: ,no acute skin rashes suspicious lesions or bleeding. No lymphadenopathy, nodules, masses.  NEURO/ PSYCH:  No neurologic signs  such as weakness numbness. No depression anxiety. IMM/ Allergy: No unusual infections.  Allergy .   REST of 12 system review negative except as per HPI   Past Medical History:  Diagnosis Date   Allergic rhinitis    Normal cardiac stress test     Past Surgical History:  Procedure Laterality Date   MYRINGOTOMY     right 7 th grade    Family History  Problem Relation Age of Onset   Lung disease Father    Cancer Father        Colon   Colon polyps Father    Multiple sclerosis Mother    Scleroderma Sister    Parkinson's disease Maternal Grandmother    Colon cancer Neg Hx    Breast cancer Neg Hx     Social History   Socioeconomic History   Marital status: Married    Spouse name: Not on file   Number of children: Not on file   Years of education: Not on file   Highest education level: Not on file  Occupational History   Not on file  Tobacco Use   Smoking status: Never   Smokeless tobacco: Never  Vaping Use   Vaping Use: Never used  Substance and Sexual Activity   Alcohol use: Yes  Alcohol/week: 1.0 standard drink of alcohol    Types: 1 Standard drinks or equivalent per week    Comment: 1 every 2 weeks    Drug use: No   Sexual activity: Not on file  Other Topics Concern   Not on file  Social History Narrative   HH  of 3   Son adopted from [place] now 36 has FAS and microcephaly   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    No tob   Social Determinants of Health   Financial Resource Strain: Low Risk  (03/30/2022)   Overall Financial Resource Strain (CARDIA)    Difficulty of Paying Living Expenses: Not hard at all  Food Insecurity: No Food Insecurity (03/30/2022)   Hunger Vital Sign    Worried About Running Out of Food in the Last Year: Never true    Ran Out of Food in the Last Year: Never true  Transportation Needs: No Transportation Needs (03/30/2022)   PRAPARE - Hydrologist (Medical): No    Lack of  Transportation (Non-Medical): No  Physical Activity: Sufficiently Active (03/30/2022)   Exercise Vital Sign    Days of Exercise per Week: 3 days    Minutes of Exercise per Session: 60 min  Stress: No Stress Concern Present (03/30/2022)   [place]    Feeling of Stress : Not at all  Social Connections: [place] (03/30/2022)   Social Connection and Isolation Panel [NHANES]    Frequency of Communication with Friends and Family: More than three times a week    Frequency of Social Gatherings with Friends and Family: More than three times a week    Attends Religious Services: More than 4 times per year    Active Member of [employer] or Organizations: Yes    Attends Music therapist: More than 4 times per year    Marital Status: Married    Outpatient Medications Prior to Visit  Medication Sig Dispense Refill   Multiple Vitamin (MULTIVITAMIN ADULT PO) Take by mouth.     predniSONE (DELTASONE) 20 MG tablet Take 2 tablets (40 mg total) by mouth daily with breakfast. (Patient taking differently: Take 40 mg by mouth as needed.) 20 tablet 0   Vitamin D, Ergocalciferol, (DRISDOL) 1.25 MG (50000 UNIT) CAPS capsule TAKE 1 CAPSULE (50,000 UNITS TOTAL) BY MOUTH EVERY 7 (SEVEN) DAYS 12 capsule 0   No facility-administered medications prior to visit.     EXAM:  BP 130/78 (BP Location: Right Arm, Patient Position: Sitting, Cuff Size: Large)   Pulse 67   Temp (!) 97.4 F (36.3 C) (Oral)   Ht 5\' 5"  (1.651 m)   SpO2 98%   BMI 36.94 kg/m   Body mass index is 36.94 kg/m. Wt Readings from Last 3 Encounters:  10/25/22 222 lb (100.7 kg)  03/30/22 222 lb (100.7 kg)  10/28/21 222 lb (100.7 kg)    Physical Exam: Vital signs reviewed [id number] is a well-developed well-nourished alert cooperative    who appearsr stated age in no acute distress.  HEENT: normocephalic atraumatic , Eyes: PERRL EOM's full, conjunctiva  clear, Nares: paten,t no deformity discharge or tenderness., Ears: no deformity EAC's clear TMs with normal landmarks.Extremtities:  No clubbing cyanosis or edema, no acute joint swelling or redness no focal atrophy mild antalgia  NEURO:grossly non focal    SKIN: No acute rashes normal turgor, color, no bruising or petechiae. PSYCH: Oriented, good  eye contact, no obvious depression anxiety, cognition and judgment appear normal.  Lab Results  Component Value Date   WBC 4.6 02/18/2023   HGB 14.8 02/18/2023   HCT 44.3 02/18/2023   PLT 247.0 02/18/2023   GLUCOSE 89 02/18/2023   CHOL 150 02/18/2023   TRIG 112.0 02/18/2023   HDL 34.70 (L) 02/18/2023   LDLCALC 93 02/18/2023   ALT 11 02/18/2023   AST 14 02/18/2023   NA 140 02/18/2023   K 4.2 02/18/2023   CL 105 02/18/2023   CREATININE 0.90 02/18/2023   BUN 20 02/18/2023   CO2 26 02/18/2023   TSH 1.20 02/18/2023   HGBA1C 5.2 02/18/2023    BP Readings from Last 3 Encounters:  02/16/23 130/78  10/25/22 110/78  10/22/22 120/80    ASSESSMENT AND PLAN:  Discussed the following assessment and plan:    ICD-10-CM   1. Arthritis  M19.90     2. Medication management  Z79.899     3. Musculoskeletal pain  M79.18     4. BMI 36.0-36.9,adult  Z68.36     Glad  prednisone is very helpful but  is not a long term solution   but suggests inflammation as cause of sx  Plan lab with mag and inflammatory markers off pred. \counseled about   glp 1   or tezapatide not covered  unless is diabetic  Return for make fasting lab appt.  and the from ther cpe  summer or other .Marland Kitchen  Patient Care Team: Burnis Medin, MD as PCP - General Patient Instructions  Lets do fasting lab work .  Ok to try x st tylenol am and  tylenol pm in evening for a while. Consider seeing  rheum  in future if needed  .    Ok to consider  ozempic mounjaro.    But not covered unless diabetic  . Let me know  Healthy weight loss will help.    Standley Brooking. Reiley Bertagnolli M.D.

## 2023-02-16 NOTE — Patient Instructions (Addendum)
Lets do fasting lab work .  Ok to try x st tylenol am and  tylenol pm in evening for a while. Consider seeing  rheum  in future if needed  .    Ok to consider  ozempic mounjaro.    But not covered unless diabetic  . Let me know  Healthy weight loss will help.

## 2023-02-18 ENCOUNTER — Other Ambulatory Visit (INDEPENDENT_AMBULATORY_CARE_PROVIDER_SITE_OTHER): Payer: Medicare HMO

## 2023-02-18 ENCOUNTER — Telehealth: Payer: Self-pay

## 2023-02-18 ENCOUNTER — Other Ambulatory Visit: Payer: Self-pay

## 2023-02-18 DIAGNOSIS — E538 Deficiency of other specified B group vitamins: Secondary | ICD-10-CM

## 2023-02-18 DIAGNOSIS — Z79899 Other long term (current) drug therapy: Secondary | ICD-10-CM

## 2023-02-18 DIAGNOSIS — M1990 Unspecified osteoarthritis, unspecified site: Secondary | ICD-10-CM

## 2023-02-18 DIAGNOSIS — M7918 Myalgia, other site: Secondary | ICD-10-CM

## 2023-02-18 LAB — COMPREHENSIVE METABOLIC PANEL
ALT: 11 U/L (ref 0–35)
AST: 14 U/L (ref 0–37)
Albumin: 4.1 g/dL (ref 3.5–5.2)
Alkaline Phosphatase: 86 U/L (ref 39–117)
BUN: 20 mg/dL (ref 6–23)
CO2: 26 mEq/L (ref 19–32)
Calcium: 9.8 mg/dL (ref 8.4–10.5)
Chloride: 105 mEq/L (ref 96–112)
Creatinine, Ser: 0.9 mg/dL (ref 0.40–1.20)
GFR: 65.26 mL/min (ref >60.00)
Glucose, Bld: 89 mg/dL (ref 70–99)
Potassium: 4.2 mEq/L (ref 3.5–5.1)
Sodium: 140 mEq/L (ref 135–145)
Total Bilirubin: 0.7 mg/dL (ref 0.2–1.2)
Total Protein: 6.9 g/dL (ref 6.0–8.3)

## 2023-02-18 LAB — CBC WITH DIFFERENTIAL/PLATELET
Basophils Absolute: 0 10*3/uL (ref 0.0–0.1)
Basophils Relative: 0.9 % (ref 0.0–3.0)
Eosinophils Absolute: 0.4 10*3/uL (ref 0.0–0.7)
Eosinophils Relative: 8 % — ABNORMAL HIGH (ref 0.0–5.0)
HCT: 44.3 % (ref 36.0–46.0)
Hemoglobin: 14.8 g/dL (ref 12.0–15.0)
Lymphocytes Relative: 24.6 % (ref 12.0–46.0)
Lymphs Abs: 1.1 10*3/uL (ref 0.7–4.0)
MCHC: 33.4 g/dL (ref 30.0–36.0)
MCV: 95.3 fl (ref 78.0–100.0)
Monocytes Absolute: 0.4 10*3/uL (ref 0.1–1.0)
Monocytes Relative: 9.3 % (ref 3.0–12.0)
Neutro Abs: 2.7 10*3/uL (ref 1.4–7.7)
Neutrophils Relative %: 57.2 % (ref 43.0–77.0)
Platelets: 247 10*3/uL (ref 150.0–400.0)
RBC: 4.65 Mil/uL (ref 3.87–5.11)
RDW: 13 % (ref 11.5–15.5)
WBC: 4.6 10*3/uL (ref 4.0–10.5)

## 2023-02-18 LAB — HEMOGLOBIN A1C: Hgb A1c MFr Bld: 5.2 % (ref 4.6–6.5)

## 2023-02-18 LAB — LIPID PANEL
Cholesterol: 150 mg/dL (ref 0–200)
HDL: 34.7 mg/dL — ABNORMAL LOW (ref >39.00)
LDL Cholesterol: 93 mg/dL (ref 0–99)
NonHDL: 115.31
Total CHOL/HDL Ratio: 4
Triglycerides: 112 mg/dL (ref 0.0–149.0)
VLDL: 22.4 mg/dL (ref 0.0–40.0)

## 2023-02-18 LAB — VITAMIN B12: Vitamin B-12: 397 pg/mL (ref 211–911)

## 2023-02-18 LAB — VITAMIN D 25 HYDROXY (VIT D DEFICIENCY, FRACTURES): VITD: 19.21 ng/mL — ABNORMAL LOW (ref 30.00–100.00)

## 2023-02-18 LAB — TSH: TSH: 1.2 u[IU]/mL (ref 0.35–5.50)

## 2023-02-18 NOTE — Telephone Encounter (Signed)
Pt had fasting lab today and request orders for Rheumatoid Arthritis.   Pt is aware provider is out and message will send to provider.   Please advise.

## 2023-02-20 NOTE — Addendum Note (Signed)
Addended byShanon Ace K on: 02/20/2023 10:00 PM   Modules accepted: Orders

## 2023-02-20 NOTE — Telephone Encounter (Signed)
I was not in office   so  requested lab not done  Would wait until off   prednisone .   Wil place future orders for ana rf and anticitrulla aby screen

## 2023-02-20 NOTE — Progress Notes (Signed)
Vit d is still  low   b12 high  Lipid ok  no diabetes  rest of lab in range. Increase . Please advise  when and what was  last dose of Vit d .

## 2023-02-21 ENCOUNTER — Encounter: Payer: Self-pay | Admitting: Internal Medicine

## 2023-02-21 NOTE — Telephone Encounter (Signed)
Contacted patient regarding to future lab. Pt reports she has not been taking prednisone since December. Only taking it when travel.   Lab appt scheduled.

## 2023-02-21 NOTE — Telephone Encounter (Signed)
See below message.  Pt had labs on 02/18/23.   Last OV-02/16/23

## 2023-02-24 ENCOUNTER — Other Ambulatory Visit: Payer: Medicare HMO

## 2023-02-28 ENCOUNTER — Other Ambulatory Visit (INDEPENDENT_AMBULATORY_CARE_PROVIDER_SITE_OTHER): Payer: Medicare HMO

## 2023-02-28 ENCOUNTER — Other Ambulatory Visit: Payer: Self-pay

## 2023-02-28 DIAGNOSIS — M1990 Unspecified osteoarthritis, unspecified site: Secondary | ICD-10-CM

## 2023-02-28 DIAGNOSIS — M7918 Myalgia, other site: Secondary | ICD-10-CM | POA: Diagnosis not present

## 2023-02-28 DIAGNOSIS — R7989 Other specified abnormal findings of blood chemistry: Secondary | ICD-10-CM

## 2023-02-28 DIAGNOSIS — Z79899 Other long term (current) drug therapy: Secondary | ICD-10-CM

## 2023-02-28 LAB — SEDIMENTATION RATE: Sed Rate: 14 mm/hr (ref 0–30)

## 2023-02-28 LAB — C-REACTIVE PROTEIN: CRP: <1 mg/dL (ref 0.5–20.0)

## 2023-02-28 MED ORDER — VITAMIN D3 50 MCG (2000 UT) PO CAPS: 2000.0000 [IU] | ORAL_CAPSULE | Freq: Every day | ORAL | 0 refills | Status: DC

## 2023-02-28 NOTE — Telephone Encounter (Signed)
So continue the 2000 iu vit d3  and we can repeat  Vit d level in 3 months  Staff please update med list and order future labs

## 2023-03-01 NOTE — Progress Notes (Signed)
Negative  inflammatory marker  and serology

## 2023-03-03 LAB — ANA: Anti Nuclear Antibody (ANA): NEGATIVE

## 2023-03-03 LAB — RHEUMATOID FACTOR: Rheumatoid fact SerPl-aCnc: <14 IU/mL (ref <14)

## 2023-03-03 LAB — CYCLIC CITRUL PEPTIDE ANTIBODY, IGG: Cyclic Citrullin Peptide Ab: <16 UNITS

## 2023-03-08 ENCOUNTER — Encounter: Payer: Self-pay | Admitting: Internal Medicine

## 2023-03-14 ENCOUNTER — Ambulatory Visit: Payer: Medicare HMO | Admitting: Sports Medicine

## 2023-03-14 ENCOUNTER — Ambulatory Visit (INDEPENDENT_AMBULATORY_CARE_PROVIDER_SITE_OTHER): Payer: Medicare HMO

## 2023-03-14 VITALS — HR 86 | Ht 65.0 in | Wt 222.0 lb

## 2023-03-14 DIAGNOSIS — Z6836 Body mass index (BMI) 36.0-36.9, adult: Secondary | ICD-10-CM | POA: Diagnosis not present

## 2023-03-14 DIAGNOSIS — M1712 Unilateral primary osteoarthritis, left knee: Secondary | ICD-10-CM

## 2023-03-14 DIAGNOSIS — E669 Obesity, unspecified: Secondary | ICD-10-CM | POA: Diagnosis not present

## 2023-03-14 DIAGNOSIS — G8929 Other chronic pain: Secondary | ICD-10-CM

## 2023-03-14 DIAGNOSIS — M25562 Pain in left knee: Secondary | ICD-10-CM

## 2023-03-14 MED ORDER — MELOXICAM 15 MG PO TABS: 15.0000 mg | ORAL_TABLET | Freq: Every day | ORAL | 0 refills | Status: DC

## 2023-03-14 NOTE — Progress Notes (Signed)
Natasha [name] D.[name] [address] [place] [address] Phone: [phone]   Assessment and Plan:     1. Chronic pain of left knee 3. Primary osteoarthritis of left knee -Chronic with exacerbation, initial sports medicine visit - Recurrence of left knee pain, primarily in posterior knee.  Consistent with flare of medial compartment osteoarthritis leading to intra-articular effusion causing posterior knee pain and likely Baker's cyst - Start meloxicam 15 mg daily x2 weeks.  If still having pain after 2 weeks, complete 3rd-week of meloxicam. May use remaining meloxicam as needed once daily for pain control.  Do not to use additional NSAIDs while taking meloxicam.  May use Tylenol [phone] mg 2 to 3 times a day for breakthrough pain. - Start HEP for knee - Referral to weight management as weight loss would significantly help decrease knee pain long-term - X-ray obtained in clinic.  My interpretation: No acute fracture or dislocation.  Severe degenerative changes in the medial compartment and patellofemoral compartment.  2. Class 2 obesity with body mass index (BMI) of 36.0 to 36.9 in adult, unspecified obesity type, unspecified whether serious comorbidity present - Amb Ref to Medical Weight Management -Patient was interested in weight loss and referral to weight management to discuss medications, diet, exercise plan.  Referral placed today - Patient had reached out to her primary care physician to ask for Ozempic.  States that she has not heard back yet whether or not she is to start this medication   Other orders - meloxicam (MOBIC) 15 MG tablet; Take 1 tablet (15 mg total) by mouth daily.    Pertinent previous records reviewed include none   Follow Up: 4 weeks for reevaluation.  If no improvement or worsening of symptoms, could consider intra-articular CSI   Subjective:   I, Moenique Parris, am serving as a Neurosurgeon for Doctor Richardean Sale  Chief Complaint: left knee pain   HPI:   03/14/23 Patient is a 72 year old female complaining of left knee pain. Patient states that left knee in the popliteal space, been going on for 2 year , thinks its a blood clot, she has pain when walking,states there is something not right , no numbness or tingling, tylenol for the pain and that helps , no radiating pain   Relevant Historical Information: None pertinent  Additional pertinent review of systems negative.   Current Outpatient Medications:    Cholecalciferol (VITAMIN D3) 50 MCG (2000 UT) CAPS, Take 1 capsule (2,000 Units total) by mouth daily., Disp: 30 capsule, Rfl: 0   meloxicam (MOBIC) 15 MG tablet, Take 1 tablet (15 mg total) by mouth daily., Disp: 21 tablet, Rfl: 0   Multiple Vitamin (MULTIVITAMIN ADULT PO), Take by mouth., Disp: , Rfl:    predniSONE (DELTASONE) 20 MG tablet, Take 2 tablets (40 mg total) by mouth daily with breakfast. (Patient taking differently: Take 40 mg by mouth as needed.), Disp: 20 tablet, Rfl: 0   Objective:     Vitals:   03/14/23 1516  Pulse: 86  SpO2: 96%  Weight: 222 lb (100.7 kg)  Height: 5\' 5"  (1.651 m)      Body mass index is 36.94 kg/m.    Physical Exam:    General:  awake, alert oriented, no acute distress nontoxic Skin: no suspicious lesions or rashes Neuro:sensation intact and strength 5/5 with no deficits, no atrophy, normal muscle tone Psych: No signs of anxiety, depression or other mood disorder  Left knee: Mild  swelling No deformity Neg fluid wave, joint milking ROM Flex 95, Ext 0 TTP posterior fossa NTTP over the quad tendon, medial fem condyle, lat fem condyle, patella, plica, patella tendon, tibial tuberostiy, fibular head,   pes anserine bursa, gerdy's tubercle, medial jt line, lateral jt line  Gait normal    Electronically signed by:  Natasha [name] D.[name] 4:48 PM 03/14/23

## 2023-03-14 NOTE — Patient Instructions (Addendum)
Good to see you   Tylenol [phone] mg daily as needed for breakthrough pain.  Prescription sent in for Meloxicam to take once daily.   We will see you back in 4 weeks  A referral has been placed to weight management.

## 2023-03-16 ENCOUNTER — Telehealth: Payer: Self-pay | Admitting: Internal Medicine

## 2023-03-16 NOTE — Telephone Encounter (Signed)
Patient called to check on progress of this request 

## 2023-03-16 NOTE — Telephone Encounter (Signed)
error 

## 2023-03-17 MED ORDER — SEMAGLUTIDE-WEIGHT MANAGEMENT 0.25 MG/0.5ML ~~LOC~~ SOAJ: 0.2500 mg | SUBCUTANEOUS | 0 refills | Status: DC

## 2023-03-17 NOTE — Telephone Encounter (Signed)
We have a sample to begin of 0.25 mg semiglutide per week   Then plan fu for weight and plan next rx   coupon for wegovy cost reduction and will  increase dose at that time  if tolerated

## 2023-03-21 NOTE — Progress Notes (Signed)
No meds change the course of  osteoarthritis  . Off loading joints  antiinflammatories  or acetaminophen could help the pain but dont change the arthritis. Your plan to lose the weight may be helpful  for joint pains

## 2023-04-01 ENCOUNTER — Other Ambulatory Visit: Payer: Self-pay | Admitting: Sports Medicine

## 2023-04-01 ENCOUNTER — Ambulatory Visit (INDEPENDENT_AMBULATORY_CARE_PROVIDER_SITE_OTHER): Payer: Medicare HMO

## 2023-04-01 VITALS — Ht 65.0 in | Wt 226.0 lb

## 2023-04-01 DIAGNOSIS — Z0000 Encounter for general adult medical examination without abnormal findings: Secondary | ICD-10-CM

## 2023-04-01 NOTE — Progress Notes (Signed)
Subjective:   Natasha [name] is a 72 y.o. female who presents for Medicare Annual (Subsequent) preventive examination.  Review of Systems    Virtual Visit via Telephone Note  I connected with  Norval Morton on 04/01/23 at 10:15 AM EDT by telephone and verified that I am speaking with the correct person using two identifiers.  Location: Patient: Home Provider: Office Persons participating in the virtual visit: patient/Nurse Health Advisor   I discussed the limitations, risks, security and privacy concerns of performing an evaluation and management service by telephone and the availability of in person appointments. The patient expressed understanding and agreed to proceed.  Interactive audio and video telecommunications were attempted between this nurse and patient, however failed, due to patient having technical difficulties OR patient did not have access to video capability.  We continued and completed visit with audio only.  Some vital signs may be absent or patient reported.   Tillie Rung, LPN  Cardiac Risk Factors include: advanced age (>86men, >8 women)     Objective:    Today's Vitals   04/01/23 1018  Weight: 226 lb (102.5 kg)  Height: 5\' 5"  (1.651 m)   Body mass index is 37.61 kg/m.     04/01/2023   10:30 AM 03/30/2022   11:01 AM 03/10/2021   12:43 PM 05/03/2017    1:01 AM 09/11/2014    4:12 PM 09/11/2014    4:01 PM  Advanced Directives  Does Patient Have a Medical Advance Directive? Yes Yes No No No No  Type of Estate agent of [place];Living will Healthcare Power of [place];Living will      Does patient want to make changes to medical advance directive?  No - Patient declined      Copy of Healthcare Power of Attorney in Chart? No - copy requested No - copy requested      Would patient like information on creating a medical advance directive?   No - Patient declined No - Patient declined No - patient declined information No - patient  declined information    Current Medications (verified) Outpatient Encounter Medications as of 04/01/2023  Medication Sig   Cholecalciferol (VITAMIN D3) 50 MCG (2000 UT) CAPS Take 1 capsule (2,000 Units total) by mouth daily.   meloxicam (MOBIC) 15 MG tablet Take 1 tablet (15 mg total) by mouth daily.   Multiple Vitamin (MULTIVITAMIN ADULT PO) Take by mouth.   predniSONE (DELTASONE) 20 MG tablet Take 2 tablets (40 mg total) by mouth daily with breakfast. (Patient taking differently: Take 40 mg by mouth as needed.)   Semaglutide-Weight Management 0.25 MG/0.5ML SOAJ Inject 0.25 mg into the skin once a week.   No facility-administered encounter medications on file as of 04/01/2023.    Allergies (verified) Patient has no known allergies.   History: Past Medical History:  Diagnosis Date   Allergic rhinitis    Normal cardiac stress test    Past Surgical History:  Procedure Laterality Date   MYRINGOTOMY     right 7 th grade   Family History  Problem Relation Age of Onset   Lung disease Father    Cancer Father        Colon   Colon polyps Father    Multiple sclerosis Mother    Scleroderma Sister    Parkinson's disease Maternal Grandmother    Colon cancer Neg Hx    Breast cancer Neg Hx    Social History   Socioeconomic History   Marital status:  Married    Spouse name: Not on file   Number of children: Not on file   Years of education: Not on file   Highest education level: Not on file  Occupational History   Not on file  Tobacco Use   Smoking status: Never   Smokeless tobacco: Never  Vaping Use   Vaping Use: Never used  Substance and Sexual Activity   Alcohol use: Yes    Alcohol/week: 1.0 standard drink of alcohol    Types: 1 Standard drinks or equivalent per week    Comment: 1 every 2 weeks    Drug use: No   Sexual activity: Not on file  Other Topics Concern   Not on file  Social History Narrative   HH  of 3   Son adopted from [place] now 51 has FAS and  microcephaly   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    No tob   Social Determinants of Health   Financial Resource Strain: Low Risk  (04/01/2023)   Overall Financial Resource Strain (CARDIA)    Difficulty of Paying Living Expenses: Not hard at all  Food Insecurity: No Food Insecurity (04/01/2023)   Hunger Vital Sign    Worried About Running Out of Food in the Last Year: Never true    Ran Out of Food in the Last Year: Never true  Transportation Needs: No Transportation Needs (04/01/2023)   PRAPARE - Administrator, Civil Service (Medical): No    Lack of Transportation (Non-Medical): No  Physical Activity: Inactive (04/01/2023)   Exercise Vital Sign    Days of Exercise per Week: 0 days    Minutes of Exercise per Session: 0 min  Stress: No Stress Concern Present (04/01/2023)   [employer] of Occupational Health - Occupational Stress Questionnaire    Feeling of Stress : Not at all  Social Connections: Socially Integrated (04/01/2023)   Social Connection and Isolation Panel [NHANES]    Frequency of Communication with Friends and Family: More than three times a week    Frequency of Social Gatherings with Friends and Family: More than three times a week    Attends Religious Services: More than 4 times per year    Active Member of [employer] or Organizations: Yes    Attends Engineer, structural: More than 4 times per year    Marital Status: Married    Tobacco Counseling Counseling given: Not Answered   Clinical Intake:  Pre-visit preparation completed: Yes  Pain : No/denies pain     BMI - recorded: 37.61 Nutritional Status: BMI > 30  Obese Nutritional Risks: None Diabetes: No  How often do you need to have someone help you when you read instructions, pamphlets, or other written materials from your doctor or pharmacy?: 1 - Never  Diabetic?  No  Interpreter Needed?: No  Information entered by :: Theresa Mulligan LPN   Activities of  Daily Living    04/01/2023   10:27 AM 03/30/2023    5:23 PM  In your present state of health, do you have any difficulty performing the following activities:  Hearing? 0 0  Vision? 0 0  Difficulty concentrating or making decisions? 0 0  Walking or climbing stairs? 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 Followed by medical attention   Dressing or bathing? 0 0  Doing errands, shopping? 0 0  Preparing Food and eating ? N N  Using the Toilet? N N  In the  past six months, have you accidently leaked urine? N N  Do you have problems with loss of bowel control? N N  Managing your Medications? N N  Managing your Finances? N N  Housekeeping or managing your Housekeeping? N N    Patient Care Team: Madelin Headings, MD as PCP - General  Indicate any recent Medical Services you may have received from other than [facility] providers in the past year (date may be approximate).     Assessment:   This is a routine wellness examination for [place].  Hearing/Vision screen Hearing Screening - Comments:: Denies hearing difficulties   Vision Screening - Comments:: Wears rx glasses - up to date with routine eye exams with  Dr Loann Quill  Dietary issues and exercise activities discussed: Exercise limited by: orthopedic condition(s)   Goals Addressed               This Visit's Progress     Patient stated (pt-stated)        I want to have my knees replaced.       Depression Screen    04/01/2023   10:25 AM 02/16/2023   11:46 AM 03/30/2022   10:57 AM 10/28/2021    3:02 PM 03/10/2021   12:49 PM 03/10/2021   12:40 PM 01/04/2019    9:16 AM  PHQ 2/9 Scores  PHQ - 2 Score 0 1 0 1 0 0 0  PHQ- 9 Score 0 7  9       Fall Risk    04/01/2023   10:29 AM 03/30/2023    5:23 PM 02/16/2023   11:46 AM 03/30/2022   11:00 AM 03/10/2021   12:45 PM  Fall Risk   Falls in the past year? 0 0 0 1 0  Number falls in past yr: 0  0 0 0  Injury with Fall? 0  0 0 0  Comment    No injury or medical attention needed    Risk for fall due to : No Fall Risks  No Fall Risks No Fall Risks   Follow up Falls prevention discussed  Falls evaluation completed  Falls evaluation completed    FALL RISK PREVENTION PERTAINING TO THE HOME:  Any stairs in or around the home? No  If so, are there any without handrails? No  Home free of loose throw rugs in walkways, pet beds, electrical cords, etc? Yes  Adequate lighting in your home to reduce risk of falls? Yes   ASSISTIVE DEVICES UTILIZED TO PREVENT FALLS:  Life alert? No  Use of a cane, walker or w/c? No  Grab bars in the bathroom? Yes  Shower chair or bench in shower? No  Elevated toilet seat or a handicapped toilet? Yes   TIMED UP AND GO:  Was the test performed? No .Audio Visit     Cognitive Function:        04/01/2023   10:30 AM 03/30/2022   11:02 AM  6CIT Screen  What Year? 0 points 0 points  What month? 0 points 0 points  What time? 0 points 0 points  Count back from 20 0 points 0 points  Months in reverse 0 points 0 points  Repeat phrase 0 points 0 points  Total Score 0 points 0 points    Immunizations Immunization History  Administered Date(s) Administered   Fluad Quad(high Dose 65+) 08/10/2019   Hepatitis A, Adult 10/03/2018   Influenza Split 09/05/2013   Influenza Whole 10/13/2007, 09/18/2008, 09/23/2009, 10/06/2010   Influenza,  High Dose Seasonal PF 09/14/2018   Influenza,inj,Quad PF,6+ Mos 07/29/2016, 09/02/2017   Moderna Sars-Covid-2 Vaccination 01/05/2020, 02/06/2020, 10/04/2020, 05/08/2021   Pneumococcal Conjugate-13 10/03/2018   Pneumococcal Polysaccharide-23 10/28/2021   Td 04/11/2008, 10/03/2018   Zoster Recombinat (Shingrix) 09/02/2017, 11/11/2017    TDAP status: Up to date  Flu Vaccine status: Up to date  Pneumococcal vaccine status: Up to date  Covid-19 vaccine status: Completed vaccines  Qualifies for Shingles Vaccine? Yes   Zostavax completed Yes   Shingrix Completed?: Yes  Screening Tests Health  Maintenance  Topic Date Due   COVID-19 Vaccine (5 - 2023-24 season) 04/17/2023 (Originally 08/06/2022)   Hepatitis C Screening  03/31/2024 (Originally 09/11/1969)   INFLUENZA VACCINE  07/07/2023   Medicare Annual Wellness (AWV)  03/31/2024   MAMMOGRAM  08/19/2024   COLONOSCOPY (Pts 45-7yrs Insurance coverage will need to be confirmed)  04/07/2025   DTaP/Tdap/Td (3 - Tdap) 10/03/2028   Pneumonia Vaccine 33+ Years old  Completed   DEXA SCAN  Completed   Zoster Vaccines- Shingrix  Completed   HPV VACCINES  Aged Out    Health Maintenance  There are no preventive care reminders to display for this patient.   Colorectal cancer screening: Type of screening: Colonoscopy. Completed 04/07/20. Repeat every 5 years  Mammogram status: Completed 08/19/22. Repeat every year  Bone Density status: Completed 09/11/21. Results reflect: Bone density results: OSTEOPOROSIS. Repeat every   years.  Lung Cancer Screening: (Low Dose CT Chest recommended if Age 63-80 years, 30 pack-year currently smoking OR have quit w/in 15years.) does not qualify.     Additional Screening:  Hepatitis C Screening: does qualify;  Deferred  Vision Screening: Recommended annual ophthalmology exams for early detection of glaucoma and other disorders of the eye. Is the patient up to date with their annual eye exam?  Yes  Who is the provider or what is the name of the office in which the patient attends annual eye exams? Dr Loann Quill If pt is not established with a provider, would they like to be referred to a provider to establish care? No .   Dental Screening: Recommended annual dental exams for proper oral hygiene  Community Resource Referral / Chronic Care Management:  CRR required this visit?  No   CCM required this visit?  No      Plan:     I have personally reviewed and noted the following in the patient's chart:   Medical and social history Use of alcohol, tobacco or illicit drugs  Current  medications and supplements including opioid prescriptions. Patient is not currently taking opioid prescriptions. Functional ability and status Nutritional status Physical activity Advanced directives List of other physicians Hospitalizations, surgeries, and ER visits in previous 12 months Vitals Screenings to include cognitive, depression, and falls Referrals and appointments  In addition, I have reviewed and discussed with patient certain preventive protocols, quality metrics, and best practice recommendations. A written personalized care plan for preventive services as well as general preventive health recommendations were provided to patient.     Tillie Rung, LPN   1/61/0960   Nurse Notes: Patient due Hep-C Screening

## 2023-04-01 NOTE — Patient Instructions (Addendum)
Natasha [name] , Thank you for taking time to come for your Medicare Wellness Visit. I appreciate your ongoing commitment to your health goals. Please review the following plan we discussed and let me know if I can assist you in the future.   These are the goals we discussed:  Goals       Exercise 3x per week (30 min per time) (pt-stated)      Swimming 1 hour 2 times a week.      Patient stated (pt-stated)      I want to have my knees replaced.        This is a list of the screening recommended for you and due dates:  Health Maintenance  Topic Date Due   COVID-19 Vaccine (5 - 2023-24 season) 04/17/2023*   Hepatitis C Screening: USPSTF Recommendation to screen - Ages 18-79 yo.  03/31/2024*   Flu Shot  07/07/2023   Medicare Annual Wellness Visit  03/31/2024   Mammogram  08/19/2024   Colon Cancer Screening  04/07/2025   DTaP/Tdap/Td vaccine (3 - Tdap) 10/03/2028   Pneumonia Vaccine  Completed   DEXA scan (bone density measurement)  Completed   Zoster (Shingles) Vaccine  Completed   HPV Vaccine  Aged Out  *Topic was postponed. The date shown is not the original due date.    Advanced directives: Please bring a copy of your health care power of attorney and living will to the office to be added to your chart at your convenience.   Conditions/risks identified: None  Next appointment: Follow up in one year for your annual wellness visit     Preventive Care 65 Years and Older, Female Preventive care refers to lifestyle choices and visits with your health care provider that can promote health and wellness. What does preventive care include? A yearly physical exam. This is also called an annual well check. Dental exams once or twice a year. Routine eye exams. Ask your health care provider how often you should have your eyes checked. Personal lifestyle choices, including: Daily care of your teeth and gums. Regular physical activity. Eating a healthy diet. Avoiding tobacco and drug  use. Limiting alcohol use. Practicing safe sex. Taking low-dose aspirin every day. Taking vitamin and mineral supplements as recommended by your health care provider. What happens during an annual well check? The services and screenings done by your health care provider during your annual well check will depend on your age, overall health, lifestyle risk factors, and family history of disease. Counseling  Your health care provider may ask you questions about your: Alcohol use. Tobacco use. Drug use. Emotional well-being. Home and relationship well-being. Sexual activity. Eating habits. History of falls. Memory and ability to understand (cognition). Work and work Astronomer. Reproductive health. Screening  You may have the following tests or measurements: Height, weight, and BMI. Blood pressure. Lipid and cholesterol levels. These may be checked every 5 years, or more frequently if you are over 64 years old. Skin check. Lung cancer screening. You may have this screening every year starting at age 59 if you have a 30-pack-year history of smoking and currently smoke or have quit within the past 15 years. Fecal occult blood test (FOBT) of the stool. You may have this test every year starting at age 17. Flexible sigmoidoscopy or colonoscopy. You may have a sigmoidoscopy every 5 years or a colonoscopy every 10 years starting at age 38. Hepatitis C blood test. Hepatitis B blood test. Sexually transmitted disease (STD) testing. Diabetes  screening. This is done by checking your blood sugar (glucose) after you have not eaten for a while (fasting). You may have this done every 1-3 years. Bone density scan. This is done to screen for osteoporosis. You may have this done starting at age 19. Mammogram. This may be done every 1-2 years. Talk to your health care provider about how often you should have regular mammograms. Talk with your health care provider about your test results, treatment  options, and if necessary, the need for more tests. Vaccines  Your health care provider may recommend certain vaccines, such as: Influenza vaccine. This is recommended every year. Tetanus, diphtheria, and acellular pertussis (Tdap, Td) vaccine. You may need a Td booster every 10 years. Zoster vaccine. You may need this after age 40. Pneumococcal 13-valent conjugate (PCV13) vaccine. One dose is recommended after age 55. Pneumococcal polysaccharide (PPSV23) vaccine. One dose is recommended after age 66. Talk to your health care provider about which screenings and vaccines you need and how often you need them. This information is not intended to replace advice given to you by your health care provider. Make sure you discuss any questions you have with your health care provider. Document Released: 12/19/2015 Document Revised: 08/11/2016 Document Reviewed: 09/23/2015 Elsevier Interactive Patient Education  2017 [place] Prevention in the Home Falls can cause injuries. They can happen to people of all ages. There are many things you can do to make your home safe and to help prevent falls. What can I do on the outside of my home? Regularly fix the edges of walkways and driveways and fix any cracks. Remove anything that might make you trip as you walk through a door, such as a raised step or threshold. Trim any bushes or trees on the path to your home. Use bright outdoor lighting. Clear any walking paths of anything that might make someone trip, such as rocks or tools. Regularly check to see if handrails are loose or broken. Make sure that both sides of any steps have handrails. Any raised decks and porches should have guardrails on the edges. Have any leaves, snow, or ice cleared regularly. Use sand or salt on walking paths during winter. Clean up any spills in your garage right away. This includes oil or grease spills. What can I do in the bathroom? Use night lights. Install grab  bars by the toilet and in the tub and shower. Do not use towel bars as grab bars. Use non-skid mats or decals in the tub or shower. If you need to sit down in the shower, use a plastic, non-slip stool. Keep the floor dry. Clean up any water that spills on the floor as soon as it happens. Remove soap buildup in the tub or shower regularly. Attach bath mats securely with double-sided non-slip rug tape. Do not have throw rugs and other things on the floor that can make you trip. What can I do in the bedroom? Use night lights. Make sure that you have a light by your bed that is easy to reach. Do not use any sheets or blankets that are too big for your bed. They should not hang down onto the floor. Have a firm chair that has side arms. You can use this for support while you get dressed. Do not have throw rugs and other things on the floor that can make you trip. What can I do in the kitchen? Clean up any spills right away. Avoid walking on wet floors. Keep  items that you use a lot in easy-to-reach places. If you need to reach something above you, use a strong step stool that has a grab bar. Keep electrical cords out of the way. Do not use floor polish or wax that makes floors slippery. If you must use wax, use non-skid floor wax. Do not have throw rugs and other things on the floor that can make you trip. What can I do with my stairs? Do not leave any items on the stairs. Make sure that there are handrails on both sides of the stairs and use them. Fix handrails that are broken or loose. Make sure that handrails are as long as the stairways. Check any carpeting to make sure that it is firmly attached to the stairs. Fix any carpet that is loose or worn. Avoid having throw rugs at the top or bottom of the stairs. If you do have throw rugs, attach them to the floor with carpet tape. Make sure that you have a light switch at the top of the stairs and the bottom of the stairs. If you do not have them,  ask someone to add them for you. What else can I do to help prevent falls? Wear shoes that: Do not have high heels. Have rubber bottoms. Are comfortable and fit you well. Are closed at the toe. Do not wear sandals. If you use a stepladder: Make sure that it is fully opened. Do not climb a closed stepladder. Make sure that both sides of the stepladder are locked into place. Ask someone to hold it for you, if possible. Clearly mark and make sure that you can see: Any grab bars or handrails. First and last steps. Where the edge of each step is. Use tools that help you move around (mobility aids) if they are needed. These include: Canes. Walkers. Scooters. Crutches. Turn on the lights when you go into a dark area. Replace any light bulbs as soon as they burn out. Set up your furniture so you have a clear path. Avoid moving your furniture around. If any of your floors are uneven, fix them. If there are any pets around you, be aware of where they are. Review your medicines with your doctor. Some medicines can make you feel dizzy. This can increase your chance of falling. Ask your doctor what other things that you can do to help prevent falls. This information is not intended to replace advice given to you by your health care provider. Make sure you discuss any questions you have with your health care provider. Document Released: 09/18/2009 Document Revised: 04/29/2016 Document Reviewed: 12/27/2014 Elsevier Interactive Patient Education  2017 [employer].

## 2023-04-08 NOTE — Progress Notes (Unsigned)
    Natasha [name] [name] [address] [place] [address] Phone: [phone]   Assessment and Plan:     There are no diagnoses linked to this encounter.  ***   Pertinent previous records reviewed include ***   Follow Up: ***     Subjective:   I, Natasha [name], am serving as a Neurosurgeon for Doctor Richardean Sale   Chief Complaint: left knee pain    HPI:    03/14/23 Patient is a 72 year old female complaining of left knee pain. Patient states that left knee in the popliteal space, been going on for 2 year , thinks its a blood clot, she has pain when walking,states there is something not right , no numbness or tingling, tylenol for the pain and that helps , no radiating pain   04/11/2023 Patient states    Relevant Historical Information: None pertinent  Additional pertinent review of systems negative.   Current Outpatient Medications:    Cholecalciferol (VITAMIN D3) 50 MCG (2000 UT) CAPS, Take 1 capsule (2,000 Units total) by mouth daily., Disp: 30 capsule, Rfl: 0   meloxicam (MOBIC) 15 MG tablet, Take 1 tablet (15 mg total) by mouth daily., Disp: 21 tablet, Rfl: 0   Multiple Vitamin (MULTIVITAMIN ADULT PO), Take by mouth., Disp: , Rfl:    predniSONE (DELTASONE) 20 MG tablet, Take 2 tablets (40 mg total) by mouth daily with breakfast. (Patient taking differently: Take 40 mg by mouth as needed.), Disp: 20 tablet, Rfl: 0   Semaglutide-Weight Management 0.25 MG/0.5ML SOAJ, Inject 0.25 mg into the skin once a week., Disp: 2 mL, Rfl: 0   Objective:     There were no vitals filed for this visit.    There is no height or weight on file to calculate BMI.    Physical Exam:    ***   Electronically signed by:  Natasha [name] [name] 7:43 AM 04/08/23

## 2023-04-11 ENCOUNTER — Ambulatory Visit: Payer: Medicare HMO | Admitting: Sports Medicine

## 2023-04-11 VITALS — BP 120/82 | HR 86 | Ht 65.0 in | Wt 226.0 lb

## 2023-04-11 DIAGNOSIS — M1712 Unilateral primary osteoarthritis, left knee: Secondary | ICD-10-CM | POA: Diagnosis not present

## 2023-04-11 DIAGNOSIS — M25562 Pain in left knee: Secondary | ICD-10-CM | POA: Diagnosis not present

## 2023-04-11 DIAGNOSIS — G8929 Other chronic pain: Secondary | ICD-10-CM

## 2023-04-11 NOTE — Patient Instructions (Signed)
Good to see you   

## 2023-05-30 ENCOUNTER — Encounter: Payer: Self-pay | Admitting: Family Medicine

## 2023-05-31 ENCOUNTER — Other Ambulatory Visit: Payer: Self-pay

## 2023-05-31 MED ORDER — PREDNISONE 20 MG PO TABS: 20.0000 mg | ORAL_TABLET | Freq: Every day | ORAL | 0 refills | Status: DC

## 2023-07-21 ENCOUNTER — Encounter (INDEPENDENT_AMBULATORY_CARE_PROVIDER_SITE_OTHER): Payer: Self-pay

## 2023-08-15 DIAGNOSIS — D225 Melanocytic nevi of trunk: Secondary | ICD-10-CM | POA: Diagnosis not present

## 2023-08-15 DIAGNOSIS — L304 Erythema intertrigo: Secondary | ICD-10-CM | POA: Diagnosis not present

## 2023-08-15 DIAGNOSIS — L821 Other seborrheic keratosis: Secondary | ICD-10-CM | POA: Diagnosis not present

## 2023-08-15 DIAGNOSIS — L820 Inflamed seborrheic keratosis: Secondary | ICD-10-CM | POA: Diagnosis not present

## 2023-08-15 DIAGNOSIS — L814 Other melanin hyperpigmentation: Secondary | ICD-10-CM | POA: Diagnosis not present

## 2023-08-15 DIAGNOSIS — L578 Other skin changes due to chronic exposure to nonionizing radiation: Secondary | ICD-10-CM | POA: Diagnosis not present

## 2023-08-15 NOTE — Progress Notes (Unsigned)
No chief complaint on file.   HPI: Patient  Natasha [name]  72 y.o. comes in today for Preventive Health Care visit  last pv  w [id number] 22  MS conditions lumbar pain  Weight management  Hx of low vit D   Health Maintenance  Topic Date Due   INFLUENZA VACCINE  07/07/2023   COVID-19 Vaccine (7 - 2023-24 season) 08/07/2023   Hepatitis C Screening  03/31/2024 (Originally 09/11/1969)   Medicare Annual Wellness (AWV)  03/31/2024   MAMMOGRAM  08/19/2024   Colonoscopy  04/07/2025   DTaP/Tdap/Td (3 - Tdap) 10/03/2028   Pneumonia Vaccine 98+ Years old  Completed   DEXA SCAN  Completed   Zoster Vaccines- Shingrix  Completed   HPV VACCINES  Aged Out   Health Maintenance Review LIFESTYLE:  Exercise:   Tobacco/ETS: Alcohol:  Sugar beverages: Sleep: Drug use: no HH of  Work:    ROS:  GEN/ HEENT: No fever, significant weight changes sweats headaches vision problems hearing changes, CV/ PULM; No chest pain shortness of breath cough, syncope,edema  change in exercise tolerance. GI /GU: No adominal pain, vomiting, change in bowel habits. No blood in the stool. No significant GU symptoms. SKIN/HEME: ,no acute skin rashes suspicious lesions or bleeding. No lymphadenopathy, nodules, masses.  NEURO/ PSYCH:  No neurologic signs such as weakness numbness. No depression anxiety. IMM/ Allergy: No unusual infections.  Allergy .   REST of 12 system review negative except as per HPI   Past Medical History:  Diagnosis Date   Allergic rhinitis    Normal cardiac stress test     Past Surgical History:  Procedure Laterality Date   MYRINGOTOMY     right 7 th grade    Family History  Problem Relation Age of Onset   Lung disease Father    Cancer Father        Colon   Colon polyps Father    Multiple sclerosis Mother    Scleroderma Sister    Parkinson's disease Maternal Grandmother    Colon cancer Neg Hx    Breast cancer Neg Hx     Social History   Socioeconomic History   Marital  status: Married    Spouse name: Not on file   Number of children: Not on file   Years of education: Not on file   Highest education level: Not on file  Occupational History   Not on file  Tobacco Use   Smoking status: Never   Smokeless tobacco: Never  Vaping Use   Vaping status: Never Used  Substance and Sexual Activity   Alcohol use: Yes    Alcohol/week: 1.0 standard drink of alcohol    Types: 1 Standard drinks or equivalent per week    Comment: 1 every 2 weeks    Drug use: No   Sexual activity: Not on file  Other Topics Concern   Not on file  Social History Narrative   HH  of 3   Son adopted from [place] now 26 has FAS and microcephaly   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    No tob   Social Determinants of Health   Financial Resource Strain: Low Risk  (04/01/2023)   Overall Financial Resource Strain (CARDIA)    Difficulty of Paying Living Expenses: Not hard at all  Food Insecurity: No Food Insecurity (04/01/2023)   Hunger Vital Sign    Worried About Running Out of Food in the Last Year: Never true  Ran Out of Food in the Last Year: Never true  Transportation Needs: No Transportation Needs (04/01/2023)   PRAPARE - Administrator, Civil Service (Medical): No    Lack of Transportation (Non-Medical): No  Physical Activity: Inactive (04/01/2023)   Exercise Vital Sign    Days of Exercise per Week: 0 days    Minutes of Exercise per Session: 0 min  Stress: No Stress Concern Present (04/01/2023)   [employer] of Occupational Health - Occupational Stress Questionnaire    Feeling of Stress : Not at all  Social Connections: Socially Integrated (04/01/2023)   Social Connection and Isolation Panel [NHANES]    Frequency of Communication with Friends and Family: More than three times a week    Frequency of Social Gatherings with Friends and Family: More than three times a week    Attends Religious Services: More than 4 times per year    Active  Member of [employer] or Organizations: Yes    Attends Engineer, structural: More than 4 times per year    Marital Status: Married    Outpatient Medications Prior to Visit  Medication Sig Dispense Refill   predniSONE (DELTASONE) 20 MG tablet Take 1 tablet (20 mg total) by mouth daily with breakfast. 14 tablet 0   Cholecalciferol (VITAMIN D3) 50 MCG (2000 UT) CAPS Take 1 capsule (2,000 Units total) by mouth daily. 30 capsule 0   meloxicam (MOBIC) 15 MG tablet Take 1 tablet (15 mg total) by mouth daily. 21 tablet 0   Multiple Vitamin (MULTIVITAMIN ADULT PO) Take by mouth.     Semaglutide-Weight Management 0.25 MG/0.5ML SOAJ Inject 0.25 mg into the skin once a week. 2 mL 0   No facility-administered medications prior to visit.     EXAM:  There were no vitals taken for this visit.  There is no height or weight on file to calculate BMI. Wt Readings from Last 3 Encounters:  04/11/23 226 lb (102.5 kg)  04/01/23 226 lb (102.5 kg)  03/14/23 222 lb (100.7 kg)    Physical Exam: Vital signs reviewed WUJ:WJXB is a well-developed well-nourished alert cooperative    who appearsr stated age in no acute distress.  HEENT: normocephalic atraumatic , Eyes: PERRL EOM's full, conjunctiva clear, Nares: paten,t no deformity discharge or tenderness., Ears: no deformity EAC's clear TMs with normal landmarks. Mouth: clear OP, no lesions, edema.  Moist mucous membranes. Dentition in adequate repair. NECK: supple without masses, thyromegaly or bruits. CHEST/PULM:  Clear to auscultation and percussion breath sounds equal no wheeze , rales or rhonchi. No chest wall deformities or tenderness. Breast: normal by inspection . No dimpling, discharge, masses, tenderness or discharge . CV: PMI is nondisplaced, S1 S2 no gallops, murmurs, rubs. Peripheral pulses are full without delay.No JVD .  ABDOMEN: Bowel sounds normal nontender  No guard or rebound, no hepato splenomegal no CVA tenderness.  No  hernia. Extremtities:  No clubbing cyanosis or edema, no acute joint swelling or redness no focal atrophy NEURO:  Oriented x3, cranial nerves 3-12 appear to be intact, no obvious focal weakness,gait within normal limits no abnormal reflexes or asymmetrical SKIN: No acute rashes normal turgor, color, no bruising or petechiae. PSYCH: Oriented, good eye contact, no obvious depression anxiety, cognition and judgment appear normal. LN: no cervical axillary inguinal adenopathy  Lab Results  Component Value Date   WBC 4.6 02/18/2023   HGB 14.8 02/18/2023   HCT 44.3 02/18/2023   PLT 247.0 02/18/2023   GLUCOSE 89 02/18/2023  CHOL 150 02/18/2023   TRIG 112.0 02/18/2023   HDL 34.70 (L) 02/18/2023   LDLCALC 93 02/18/2023   ALT 11 02/18/2023   AST 14 02/18/2023   NA 140 02/18/2023   K 4.2 02/18/2023   CL 105 02/18/2023   CREATININE 0.90 02/18/2023   BUN 20 02/18/2023   CO2 26 02/18/2023   TSH 1.20 02/18/2023   HGBA1C 5.2 02/18/2023    BP Readings from Last 3 Encounters:  04/11/23 120/82  02/16/23 130/78  10/25/22 110/78    Lab results reviewed with patient   ASSESSMENT AND PLAN:  Discussed the following assessment and plan:    ICD-10-CM   1. Visit for preventive health examination  Z00.00     2. Medication management  Z79.899     3. Low HDL (under 40)  E78.6     4. Arthritis  M19.90      No follow-ups on file.  Patient Care Team: Madelin Headings, MD as PCP - General There are no Patient Instructions on file for this visit.  Neta Mends. Clary Boulais M.D.

## 2023-08-16 ENCOUNTER — Ambulatory Visit (INDEPENDENT_AMBULATORY_CARE_PROVIDER_SITE_OTHER): Payer: Medicare HMO | Admitting: Internal Medicine

## 2023-08-16 ENCOUNTER — Encounter: Payer: Self-pay | Admitting: Internal Medicine

## 2023-08-16 VITALS — BP 100/66 | HR 75 | Temp 97.7°F | Ht 65.0 in | Wt 243.2 lb

## 2023-08-16 DIAGNOSIS — Z79899 Other long term (current) drug therapy: Secondary | ICD-10-CM

## 2023-08-16 DIAGNOSIS — Z0000 Encounter for general adult medical examination without abnormal findings: Secondary | ICD-10-CM

## 2023-08-16 DIAGNOSIS — E786 Lipoprotein deficiency: Secondary | ICD-10-CM

## 2023-08-16 DIAGNOSIS — Z6841 Body Mass Index (BMI) 40.0 and over, adult: Secondary | ICD-10-CM

## 2023-08-16 DIAGNOSIS — M1990 Unspecified osteoarthritis, unspecified site: Secondary | ICD-10-CM | POA: Diagnosis not present

## 2023-08-16 NOTE — Patient Instructions (Addendum)
Good to see you today . Gladyou are doing well( except for knee)  The semiglutide dose you were on was the starter does and no expected to be effective for weight loss Flu vaccine today

## 2023-08-17 ENCOUNTER — Other Ambulatory Visit: Payer: Self-pay

## 2023-08-17 ENCOUNTER — Encounter: Payer: Self-pay | Admitting: Family Medicine

## 2023-08-17 DIAGNOSIS — M1711 Unilateral primary osteoarthritis, right knee: Secondary | ICD-10-CM

## 2023-08-30 DIAGNOSIS — R92323 Mammographic fibroglandular density, bilateral breasts: Secondary | ICD-10-CM | POA: Diagnosis not present

## 2023-08-30 DIAGNOSIS — Z1231 Encounter for screening mammogram for malignant neoplasm of breast: Secondary | ICD-10-CM | POA: Diagnosis not present

## 2023-08-30 LAB — HM MAMMOGRAPHY

## 2023-08-31 DIAGNOSIS — M25552 Pain in left hip: Secondary | ICD-10-CM | POA: Diagnosis not present

## 2023-08-31 DIAGNOSIS — M170 Bilateral primary osteoarthritis of knee: Secondary | ICD-10-CM | POA: Diagnosis not present

## 2023-08-31 DIAGNOSIS — M25551 Pain in right hip: Secondary | ICD-10-CM | POA: Diagnosis not present

## 2023-09-01 ENCOUNTER — Encounter: Payer: Self-pay | Admitting: Internal Medicine

## 2023-09-06 NOTE — Progress Notes (Signed)
ACUTE VISIT No chief complaint on file.  HPI: [name] is a 72 y.o. female with a PMHx significant for HLD, B12 deficiency, OA, and allergic rhinitis who is here today complaining of right sided headache for the last 3 weeks.  HPI  Review of Systems See other pertinent positives and negatives in HPI.  Current Outpatient Medications on File Prior to Visit  Medication Sig Dispense Refill   Cholecalciferol (VITAMIN D3) 50 MCG (2000 UT) CAPS Take 1 capsule (2,000 Units total) by mouth daily. (Patient not taking: Reported on 08/16/2023) 30 capsule 0   Multiple Vitamin (MULTIVITAMIN ADULT PO) Take by mouth.     predniSONE (DELTASONE) 20 MG tablet Take 1 tablet (20 mg total) by mouth daily with breakfast. (Patient taking differently: Take 20 mg by mouth daily with breakfast. PRN) 14 tablet 0   No current facility-administered medications on file prior to visit.    Past Medical History:  Diagnosis Date   Allergic rhinitis    Normal cardiac stress test    No Known Allergies  Social History   Socioeconomic History   Marital status: Married    Spouse name: Not on file   Number of children: Not on file   Years of education: Not on file   Highest education level: Not on file  Occupational History   Not on file  Tobacco Use   Smoking status: Never   Smokeless tobacco: Never  Vaping Use   Vaping status: Never Used  Substance and Sexual Activity   Alcohol use: Yes    Alcohol/week: 1.0 standard drink of alcohol    Types: 1 Standard drinks or equivalent per week    Comment: 1 every 2 weeks    Drug use: No   Sexual activity: Not on file  Other Topics Concern   Not on file  Social History Narrative   HH  of 3   Son adopted from [place] now 74 has FAS and microcephaly   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    No tob   Social Determinants of Health   Financial Resource Strain: Low Risk  (04/01/2023)   Overall Financial Resource Strain (CARDIA)     Difficulty of Paying Living Expenses: Not hard at all  Food Insecurity: No Food Insecurity (04/01/2023)   Hunger Vital Sign    Worried About Running Out of Food in the Last Year: Never true    Ran Out of Food in the Last Year: Never true  Transportation Needs: No Transportation Needs (04/01/2023)   PRAPARE - Administrator, Civil Service (Medical): No    Lack of Transportation (Non-Medical): No  Physical Activity: Inactive (08/16/2023)   Exercise Vital Sign    Days of Exercise per Week: 0 days    Minutes of Exercise per Session: 0 min  Stress: No Stress Concern Present (08/16/2023)   [employer] of Occupational Health - Occupational Stress Questionnaire    Feeling of Stress : Not at all  Social Connections: Socially Integrated (04/01/2023)   Social Connection and Isolation Panel [NHANES]    Frequency of Communication with Friends and Family: More than three times a week    Frequency of Social Gatherings with Friends and Family: More than three times a week    Attends Religious Services: More than 4 times per year    Active Member of [employer] or Organizations: Yes    Attends Banker Meetings: More than 4 times per year  Marital Status: Married    There were no vitals filed for this visit. There is no height or weight on file to calculate BMI.  Physical Exam  ASSESSMENT AND PLAN:  Ms. Mangal was seen today for headache. ***  There are no diagnoses linked to this encounter.  No follow-ups on file.  I, Suanne Marker, acting as a scribe for Daijon Wenke [place], MD., have documented all relevant documentation on the behalf of Natasha Hofland [place], MD, as directed by  Natasha Rosengren [place], MD while in the presence of Kebin Maye [place], MD.   I, Suanne Marker, have reviewed all documentation for this visit. The documentation on 09/06/23 for the exam, diagnosis, procedures, and orders are all accurate and complete.  Zayveon Raschke G. [place], MD  [facility]. [place]  office.  Discharge Instructions   None

## 2023-09-07 ENCOUNTER — Encounter: Payer: Self-pay | Admitting: Family Medicine

## 2023-09-07 ENCOUNTER — Ambulatory Visit (INDEPENDENT_AMBULATORY_CARE_PROVIDER_SITE_OTHER): Payer: Medicare HMO | Admitting: Family Medicine

## 2023-09-07 VITALS — BP 118/76 | HR 75 | Temp 97.5°F | Resp 16 | Ht 65.0 in | Wt 244.5 lb

## 2023-09-07 DIAGNOSIS — R519 Headache, unspecified: Secondary | ICD-10-CM

## 2023-09-07 DIAGNOSIS — G4452 New daily persistent headache (NDPH): Secondary | ICD-10-CM | POA: Diagnosis not present

## 2023-09-07 LAB — BASIC METABOLIC PANEL
BUN: 18 mg/dL (ref 6–23)
CO2: 28 meq/L (ref 19–32)
Calcium: 9.7 mg/dL (ref 8.4–10.5)
Chloride: 106 meq/L (ref 96–112)
Creatinine, Ser: 0.93 mg/dL (ref 0.40–1.20)
GFR: 62.5 mL/min (ref >60.00)
Glucose, Bld: 86 mg/dL (ref 70–99)
Potassium: 3.7 meq/L (ref 3.5–5.1)
Sodium: 141 meq/L (ref 135–145)

## 2023-09-07 LAB — CBC
HCT: 42.2 % (ref 36.0–46.0)
Hemoglobin: 14.1 g/dL (ref 12.0–15.0)
MCHC: 33.4 g/dL (ref 30.0–36.0)
MCV: 96.1 fL (ref 78.0–100.0)
Platelets: 280 10*3/uL (ref 150.0–400.0)
RBC: 4.39 Mil/uL (ref 3.87–5.11)
RDW: 12.9 % (ref 11.5–15.5)
WBC: 5.1 10*3/uL (ref 4.0–10.5)

## 2023-09-07 LAB — C-REACTIVE PROTEIN: CRP: <1 mg/dL (ref 0.5–20.0)

## 2023-09-07 LAB — SEDIMENTATION RATE: Sed Rate: 10 mm/h (ref 0–30)

## 2023-09-07 NOTE — Patient Instructions (Addendum)
A few things to remember from today's visit:  New daily persistent headache - Plan: CT HEAD WO CONTRAST ( ), Basic metabolic panel, CBC, Sedimentation rate, C-reactive protein  Temporal headache - Plan: Sedimentation rate, C-reactive protein  Do not use My Chart to request refills or for acute issues that need immediate attention. If you send a my chart message, it may take a few days to be addressed, specially if I am not in the office.  Please be sure medication list is accurate. If a new problem present, please set up appointment sooner than planned today.

## 2023-09-12 ENCOUNTER — Ambulatory Visit: Payer: Medicare HMO | Admitting: Family Medicine

## 2023-09-22 DIAGNOSIS — Z09 Encounter for follow-up examination after completed treatment for conditions other than malignant neoplasm: Secondary | ICD-10-CM | POA: Diagnosis not present

## 2023-09-22 DIAGNOSIS — D123 Benign neoplasm of transverse colon: Secondary | ICD-10-CM | POA: Diagnosis not present

## 2023-09-22 DIAGNOSIS — D120 Benign neoplasm of cecum: Secondary | ICD-10-CM | POA: Diagnosis not present

## 2023-09-22 DIAGNOSIS — Z860100 Personal history of colon polyps, unspecified: Secondary | ICD-10-CM | POA: Diagnosis not present

## 2023-09-22 DIAGNOSIS — D126 Benign neoplasm of colon, unspecified: Secondary | ICD-10-CM | POA: Diagnosis not present

## 2023-09-22 DIAGNOSIS — Z800 Family history of malignant neoplasm of digestive organs: Secondary | ICD-10-CM | POA: Diagnosis not present

## 2023-09-22 DIAGNOSIS — K5730 Diverticulosis of large intestine without perforation or abscess without bleeding: Secondary | ICD-10-CM | POA: Diagnosis not present

## 2023-09-22 LAB — HM COLONOSCOPY

## 2023-09-26 DIAGNOSIS — M1712 Unilateral primary osteoarthritis, left knee: Secondary | ICD-10-CM | POA: Diagnosis not present

## 2023-09-26 DIAGNOSIS — M25562 Pain in left knee: Secondary | ICD-10-CM | POA: Diagnosis not present

## 2023-09-29 ENCOUNTER — Telehealth: Payer: Self-pay

## 2023-09-29 NOTE — Telephone Encounter (Signed)
Received a message from front desk reporting that surgical place follow up on clearance form.   They had end the call before got transfer to this CMA.   Form was placed in provider red's folder.   Surgery date on 10/20/2023.  Follow up with [facility] and spoke to [place].   Inform her provider is out for this and will be back this Tuesday. Sending a high priority message to provider. Verbalized understanding.

## 2023-09-30 ENCOUNTER — Telehealth: Payer: Self-pay | Admitting: Internal Medicine

## 2023-09-30 ENCOUNTER — Telehealth: Payer: Self-pay

## 2023-09-30 NOTE — Telephone Encounter (Signed)
Received a fax from [facility] on a surgical pathology report.   Reports is placed in provider's green folder to review.

## 2023-09-30 NOTE — Telephone Encounter (Signed)
[facility] is calling and  pt is sch for [facility] however the [facility] is for [facility]. Pt is sch for 10-05-2023 for CT Head w/o contrast at [facility]. Please redo

## 2023-10-05 ENCOUNTER — Ambulatory Visit (HOSPITAL_BASED_OUTPATIENT_CLINIC_OR_DEPARTMENT_OTHER)
Admission: RE | Admit: 2023-10-05 | Discharge: 2023-10-05 | Disposition: A | Discharge status: Home / Self Care | Payer: Medicare HMO | Source: Ambulatory Visit | Attending: Family Medicine | Admitting: Family Medicine

## 2023-10-05 DIAGNOSIS — R519 Headache, unspecified: Secondary | ICD-10-CM | POA: Diagnosis not present

## 2023-10-05 DIAGNOSIS — G4452 New daily persistent headache (NDPH): Secondary | ICD-10-CM | POA: Insufficient documentation

## 2023-10-05 NOTE — Telephone Encounter (Signed)
Signed form for optimization  but developed a new problem headache seen by  Dr. [place] earlier this month and awaiting results of ct scan  If she is better and scan ok  then can proceed with elective surgery .   Please contact patient and see if her headaches are better  uncertain when ct scan will be read.

## 2023-10-06 NOTE — Telephone Encounter (Signed)
Attempted to reach Silvestre Mesi, [place] with [facility]. Left a voicemail to call us.

## 2023-10-06 NOTE — Telephone Encounter (Signed)
Spoke to pt.   Pt reports she is feeling fine. Her sx was really ease. She said her sx is probably an inflammatory headache per Dr. Swaziland  Currently pt, states she is not having headache. Last experience sx was a week ago.

## 2023-10-06 NOTE — Telephone Encounter (Signed)
Great   send a message  to her surgeon office that patient is optimized for surgery. And ok to proceed .

## 2023-10-07 ENCOUNTER — Telehealth: Payer: Self-pay | Admitting: Internal Medicine

## 2023-10-07 NOTE — Telephone Encounter (Signed)
Good morning, you had a call from [facility] @ [facility]  Returning your call Please call [phone]

## 2023-10-07 NOTE — Telephone Encounter (Signed)
Spoke to [place] and form was re-fax. See other phone encounter.

## 2023-10-07 NOTE — Telephone Encounter (Signed)
Spoke to [employer] earlier today to update the message. She states to fax over the message.   Provider's message printed and re-fax to [facility].

## 2023-10-10 ENCOUNTER — Encounter: Payer: Self-pay | Admitting: Internal Medicine

## 2023-10-10 ENCOUNTER — Encounter: Payer: Self-pay | Admitting: Family Medicine

## 2023-10-20 DIAGNOSIS — G8918 Other acute postprocedural pain: Secondary | ICD-10-CM | POA: Diagnosis not present

## 2023-10-20 DIAGNOSIS — M25762 Osteophyte, left knee: Secondary | ICD-10-CM | POA: Diagnosis not present

## 2023-10-20 DIAGNOSIS — M1712 Unilateral primary osteoarthritis, left knee: Secondary | ICD-10-CM | POA: Diagnosis not present

## 2023-10-21 DIAGNOSIS — R2681 Unsteadiness on feet: Secondary | ICD-10-CM | POA: Diagnosis not present

## 2023-10-21 DIAGNOSIS — M6281 Muscle weakness (generalized): Secondary | ICD-10-CM | POA: Diagnosis not present

## 2023-10-21 DIAGNOSIS — M1712 Unilateral primary osteoarthritis, left knee: Secondary | ICD-10-CM | POA: Diagnosis not present

## 2023-10-21 DIAGNOSIS — M170 Bilateral primary osteoarthritis of knee: Secondary | ICD-10-CM | POA: Diagnosis not present

## 2023-10-21 DIAGNOSIS — Z471 Aftercare following joint replacement surgery: Secondary | ICD-10-CM | POA: Diagnosis not present

## 2023-10-24 DIAGNOSIS — M1712 Unilateral primary osteoarthritis, left knee: Secondary | ICD-10-CM | POA: Diagnosis not present

## 2023-10-24 DIAGNOSIS — M6281 Muscle weakness (generalized): Secondary | ICD-10-CM | POA: Diagnosis not present

## 2023-10-25 DIAGNOSIS — R2681 Unsteadiness on feet: Secondary | ICD-10-CM | POA: Diagnosis not present

## 2023-10-25 DIAGNOSIS — Z471 Aftercare following joint replacement surgery: Secondary | ICD-10-CM | POA: Diagnosis not present

## 2023-10-25 DIAGNOSIS — M6281 Muscle weakness (generalized): Secondary | ICD-10-CM | POA: Diagnosis not present

## 2023-10-25 DIAGNOSIS — M170 Bilateral primary osteoarthritis of knee: Secondary | ICD-10-CM | POA: Diagnosis not present

## 2023-10-26 DIAGNOSIS — R2681 Unsteadiness on feet: Secondary | ICD-10-CM | POA: Diagnosis not present

## 2023-10-26 DIAGNOSIS — Z471 Aftercare following joint replacement surgery: Secondary | ICD-10-CM | POA: Diagnosis not present

## 2023-10-26 DIAGNOSIS — M6281 Muscle weakness (generalized): Secondary | ICD-10-CM | POA: Diagnosis not present

## 2023-10-26 DIAGNOSIS — M170 Bilateral primary osteoarthritis of knee: Secondary | ICD-10-CM | POA: Diagnosis not present

## 2023-10-27 DIAGNOSIS — M170 Bilateral primary osteoarthritis of knee: Secondary | ICD-10-CM | POA: Diagnosis not present

## 2023-10-27 DIAGNOSIS — M6281 Muscle weakness (generalized): Secondary | ICD-10-CM | POA: Diagnosis not present

## 2023-10-27 DIAGNOSIS — Z471 Aftercare following joint replacement surgery: Secondary | ICD-10-CM | POA: Diagnosis not present

## 2023-10-27 DIAGNOSIS — R2681 Unsteadiness on feet: Secondary | ICD-10-CM | POA: Diagnosis not present

## 2023-10-28 DIAGNOSIS — M1712 Unilateral primary osteoarthritis, left knee: Secondary | ICD-10-CM | POA: Diagnosis not present

## 2023-10-28 DIAGNOSIS — M6281 Muscle weakness (generalized): Secondary | ICD-10-CM | POA: Diagnosis not present

## 2023-10-31 DIAGNOSIS — M6281 Muscle weakness (generalized): Secondary | ICD-10-CM | POA: Diagnosis not present

## 2023-10-31 DIAGNOSIS — M170 Bilateral primary osteoarthritis of knee: Secondary | ICD-10-CM | POA: Diagnosis not present

## 2023-10-31 DIAGNOSIS — Z471 Aftercare following joint replacement surgery: Secondary | ICD-10-CM | POA: Diagnosis not present

## 2023-10-31 DIAGNOSIS — R2681 Unsteadiness on feet: Secondary | ICD-10-CM | POA: Diagnosis not present

## 2023-11-02 DIAGNOSIS — Z471 Aftercare following joint replacement surgery: Secondary | ICD-10-CM | POA: Diagnosis not present

## 2023-11-02 DIAGNOSIS — M170 Bilateral primary osteoarthritis of knee: Secondary | ICD-10-CM | POA: Diagnosis not present

## 2023-11-02 DIAGNOSIS — M1712 Unilateral primary osteoarthritis, left knee: Secondary | ICD-10-CM | POA: Diagnosis not present

## 2023-11-02 DIAGNOSIS — R2681 Unsteadiness on feet: Secondary | ICD-10-CM | POA: Diagnosis not present

## 2023-11-02 DIAGNOSIS — M6281 Muscle weakness (generalized): Secondary | ICD-10-CM | POA: Diagnosis not present

## 2023-11-10 DIAGNOSIS — M1712 Unilateral primary osteoarthritis, left knee: Secondary | ICD-10-CM | POA: Diagnosis not present

## 2023-11-10 DIAGNOSIS — M6281 Muscle weakness (generalized): Secondary | ICD-10-CM | POA: Diagnosis not present

## 2023-12-08 DIAGNOSIS — M170 Bilateral primary osteoarthritis of knee: Secondary | ICD-10-CM | POA: Diagnosis not present

## 2023-12-08 DIAGNOSIS — R2681 Unsteadiness on feet: Secondary | ICD-10-CM | POA: Diagnosis not present

## 2023-12-08 DIAGNOSIS — M6281 Muscle weakness (generalized): Secondary | ICD-10-CM | POA: Diagnosis not present

## 2023-12-08 DIAGNOSIS — Z471 Aftercare following joint replacement surgery: Secondary | ICD-10-CM | POA: Diagnosis not present

## 2023-12-12 DIAGNOSIS — R2681 Unsteadiness on feet: Secondary | ICD-10-CM | POA: Diagnosis not present

## 2023-12-12 DIAGNOSIS — M6281 Muscle weakness (generalized): Secondary | ICD-10-CM | POA: Diagnosis not present

## 2023-12-12 DIAGNOSIS — Z471 Aftercare following joint replacement surgery: Secondary | ICD-10-CM | POA: Diagnosis not present

## 2023-12-12 DIAGNOSIS — M170 Bilateral primary osteoarthritis of knee: Secondary | ICD-10-CM | POA: Diagnosis not present

## 2023-12-14 DIAGNOSIS — M170 Bilateral primary osteoarthritis of knee: Secondary | ICD-10-CM | POA: Diagnosis not present

## 2023-12-14 DIAGNOSIS — R2681 Unsteadiness on feet: Secondary | ICD-10-CM | POA: Diagnosis not present

## 2023-12-14 DIAGNOSIS — M6281 Muscle weakness (generalized): Secondary | ICD-10-CM | POA: Diagnosis not present

## 2023-12-14 DIAGNOSIS — Z471 Aftercare following joint replacement surgery: Secondary | ICD-10-CM | POA: Diagnosis not present

## 2023-12-15 ENCOUNTER — Telehealth: Payer: Self-pay

## 2023-12-15 NOTE — Telephone Encounter (Signed)
 Attempted to reach pt.   Left  a detail message stating that we received a surgical clearance from [facility]. Calling to schedule an appt for it. To call us back when receive the message.

## 2023-12-16 DIAGNOSIS — M6281 Muscle weakness (generalized): Secondary | ICD-10-CM | POA: Diagnosis not present

## 2023-12-16 DIAGNOSIS — R2681 Unsteadiness on feet: Secondary | ICD-10-CM | POA: Diagnosis not present

## 2023-12-16 DIAGNOSIS — M170 Bilateral primary osteoarthritis of knee: Secondary | ICD-10-CM | POA: Diagnosis not present

## 2023-12-16 DIAGNOSIS — Z471 Aftercare following joint replacement surgery: Secondary | ICD-10-CM | POA: Diagnosis not present

## 2023-12-19 DIAGNOSIS — M6281 Muscle weakness (generalized): Secondary | ICD-10-CM | POA: Diagnosis not present

## 2023-12-19 DIAGNOSIS — M170 Bilateral primary osteoarthritis of knee: Secondary | ICD-10-CM | POA: Diagnosis not present

## 2023-12-19 DIAGNOSIS — R2681 Unsteadiness on feet: Secondary | ICD-10-CM | POA: Diagnosis not present

## 2023-12-19 DIAGNOSIS — Z471 Aftercare following joint replacement surgery: Secondary | ICD-10-CM | POA: Diagnosis not present

## 2023-12-21 DIAGNOSIS — M6281 Muscle weakness (generalized): Secondary | ICD-10-CM | POA: Diagnosis not present

## 2023-12-21 DIAGNOSIS — Z471 Aftercare following joint replacement surgery: Secondary | ICD-10-CM | POA: Diagnosis not present

## 2023-12-21 DIAGNOSIS — M170 Bilateral primary osteoarthritis of knee: Secondary | ICD-10-CM | POA: Diagnosis not present

## 2023-12-21 DIAGNOSIS — R2681 Unsteadiness on feet: Secondary | ICD-10-CM | POA: Diagnosis not present

## 2023-12-23 DIAGNOSIS — R2681 Unsteadiness on feet: Secondary | ICD-10-CM | POA: Diagnosis not present

## 2023-12-23 DIAGNOSIS — M6281 Muscle weakness (generalized): Secondary | ICD-10-CM | POA: Diagnosis not present

## 2023-12-23 DIAGNOSIS — Z471 Aftercare following joint replacement surgery: Secondary | ICD-10-CM | POA: Diagnosis not present

## 2023-12-23 DIAGNOSIS — M170 Bilateral primary osteoarthritis of knee: Secondary | ICD-10-CM | POA: Diagnosis not present

## 2023-12-26 DIAGNOSIS — R2681 Unsteadiness on feet: Secondary | ICD-10-CM | POA: Diagnosis not present

## 2023-12-26 DIAGNOSIS — M6281 Muscle weakness (generalized): Secondary | ICD-10-CM | POA: Diagnosis not present

## 2023-12-26 DIAGNOSIS — H52223 Regular astigmatism, bilateral: Secondary | ICD-10-CM | POA: Diagnosis not present

## 2023-12-26 DIAGNOSIS — Z471 Aftercare following joint replacement surgery: Secondary | ICD-10-CM | POA: Diagnosis not present

## 2023-12-26 DIAGNOSIS — M170 Bilateral primary osteoarthritis of knee: Secondary | ICD-10-CM | POA: Diagnosis not present

## 2023-12-28 DIAGNOSIS — Z471 Aftercare following joint replacement surgery: Secondary | ICD-10-CM | POA: Diagnosis not present

## 2023-12-28 DIAGNOSIS — R2681 Unsteadiness on feet: Secondary | ICD-10-CM | POA: Diagnosis not present

## 2023-12-28 DIAGNOSIS — M170 Bilateral primary osteoarthritis of knee: Secondary | ICD-10-CM | POA: Diagnosis not present

## 2023-12-28 DIAGNOSIS — M6281 Muscle weakness (generalized): Secondary | ICD-10-CM | POA: Diagnosis not present

## 2023-12-30 DIAGNOSIS — M170 Bilateral primary osteoarthritis of knee: Secondary | ICD-10-CM | POA: Diagnosis not present

## 2023-12-30 DIAGNOSIS — M6281 Muscle weakness (generalized): Secondary | ICD-10-CM | POA: Diagnosis not present

## 2023-12-30 DIAGNOSIS — R2681 Unsteadiness on feet: Secondary | ICD-10-CM | POA: Diagnosis not present

## 2023-12-30 DIAGNOSIS — Z471 Aftercare following joint replacement surgery: Secondary | ICD-10-CM | POA: Diagnosis not present

## 2024-01-04 DIAGNOSIS — Z471 Aftercare following joint replacement surgery: Secondary | ICD-10-CM | POA: Diagnosis not present

## 2024-01-04 DIAGNOSIS — M6281 Muscle weakness (generalized): Secondary | ICD-10-CM | POA: Diagnosis not present

## 2024-01-04 DIAGNOSIS — R2681 Unsteadiness on feet: Secondary | ICD-10-CM | POA: Diagnosis not present

## 2024-01-04 DIAGNOSIS — M170 Bilateral primary osteoarthritis of knee: Secondary | ICD-10-CM | POA: Diagnosis not present

## 2024-01-05 ENCOUNTER — Encounter: Payer: Self-pay | Admitting: Internal Medicine

## 2024-01-05 ENCOUNTER — Ambulatory Visit (INDEPENDENT_AMBULATORY_CARE_PROVIDER_SITE_OTHER): Payer: Medicare HMO | Admitting: Internal Medicine

## 2024-01-05 VITALS — BP 120/74 | HR 79 | Temp 97.4°F | Ht 65.0 in | Wt 245.8 lb

## 2024-01-05 DIAGNOSIS — Z01818 Encounter for other preprocedural examination: Secondary | ICD-10-CM

## 2024-01-05 DIAGNOSIS — M1711 Unilateral primary osteoarthritis, right knee: Secondary | ICD-10-CM

## 2024-01-05 NOTE — Progress Notes (Signed)
Chief Complaint  Patient presents with   Pre-op Exam    HPI: Natasha [name] 73 y.o. come in for preop evaluation  and form  to establish optimization of health . She is to have TKA "other knee " right   on March 08 2024, Dr Dion Saucier.  Her left knee has done well and she is pleased with her mobility  improvement but the r knee has been limiting at times with walking .  Continues to work on [employer] . ROS:  No cp sob bleeding neuro events or concerns  Now in friends home independent house  and doing well with activity as possible  Of note, she considers prednisone her "miracle drug" (as it  helps her joint pains when flares) Past Medical History:  Diagnosis Date   Allergic rhinitis    Normal cardiac stress test     Family History  Problem Relation Age of Onset   Lung disease Father    Cancer Father        Colon   Colon polyps Father    Multiple sclerosis Mother    Scleroderma Sister    Parkinson's disease Maternal Grandmother    Colon cancer Neg Hx    Breast cancer Neg Hx     Social History   Socioeconomic History   Marital status: Married    Spouse name: Not on file   Number of children: Not on file   Years of education: Not on file   Highest education level: Not on file  Occupational History   Not on file  Tobacco Use   Smoking status: Never   Smokeless tobacco: Never  Vaping Use   Vaping status: Never Used  Substance and Sexual Activity   Alcohol use: Yes    Alcohol/week: 1.0 standard drink of alcohol    Types: 1 Standard drinks or equivalent per week    Comment: 1 every 2 weeks    Drug use: No   Sexual activity: Not on file  Other Topics Concern   Not on file  Social History Narrative   HH  of 3   Son adopted from [place] now 64 has FAS and microcephaly   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    No tob   Social Drivers of Corporate investment banker Strain: Low Risk  (04/01/2023)   Overall Financial Resource Strain  (CARDIA)    Difficulty of Paying Living Expenses: Not hard at all  Food Insecurity: No Food Insecurity (04/01/2023)   Hunger Vital Sign    Worried About Running Out of Food in the Last Year: Never true    Ran Out of Food in the Last Year: Never true  Transportation Needs: No Transportation Needs (04/01/2023)   PRAPARE - Administrator, Civil Service (Medical): No    Lack of Transportation (Non-Medical): No  Physical Activity: Inactive (08/16/2023)   Exercise Vital Sign    Days of Exercise per Week: 0 days    Minutes of Exercise per Session: 0 min  Stress: No Stress Concern Present (08/16/2023)   [employer] of Occupational Health - Occupational Stress Questionnaire    Feeling of Stress : Not at all  Social Connections: Socially Integrated (04/01/2023)   Social Connection and Isolation Panel [NHANES]    Frequency of Communication with Friends and Family: More than three times a week    Frequency of Social Gatherings with Friends and Family: More than three times a week  Attends Religious Services: More than 4 times per year    Active Member of Clubs or Organizations: Yes    Attends Banker Meetings: More than 4 times per year    Marital Status: Married    Outpatient Medications Prior to Visit  Medication Sig Dispense Refill   Multiple Vitamin (MULTIVITAMIN ADULT PO) Take by mouth.     No facility-administered medications prior to visit.     EXAM:  BP 120/74 (BP Location: Left Arm, Patient Position: Sitting, Cuff Size: Large)   Pulse 79   Temp (!) 97.4 F (36.3 C) (Oral)   Ht 5\' 5"  (1.651 m)   Wt 245 lb 12.8 oz (111.5 kg)   SpO2 96%   BMI 40.90 kg/m   Body mass index is 40.9 kg/m.  GENERAL: vitals reviewed and listed above, alert, oriented, appears well hydrated and in no acute distress HEENT: atraumatic, conjunctiva  clear, no obvious abnormalities on inspection of external nose and ears OP : no lesion edema or exudate  NECK: no obvious  masses on inspection palpation  LUNGS: clear to auscultation bilaterally, no wheezes, rales or rhonchi, good air movement CV: HRRR, no clubbing cyanosis or  peripheral edema nl cap refill pulses intact  Abdomen:  Sof,t normal bowel sounds without hepatosplenomegaly, no guarding rebound or masses no CVA tenderness MS: moves all extremities left knee scar well healed and no swelling . R knee with oa changes no acute effusion or warmth NEURO: oriented x 3 CN 3-12 appear intact. DTRs symmetrical. Gait antalgic   Grossly non focal. No tremor or abnormal movement. Skin :no  bruising petechia  of acute findings  PSYCH: pleasant and cooperative, no obvious depression or anxiety Lab Results  Component Value Date   WBC 5.1 09/07/2023   HGB 14.1 09/07/2023   HCT 42.2 09/07/2023   PLT 280.0 09/07/2023   GLUCOSE 86 09/07/2023   CHOL 150 02/18/2023   TRIG 112.0 02/18/2023   HDL 34.70 (L) 02/18/2023   LDLCALC 93 02/18/2023   ALT 11 02/18/2023   AST 14 02/18/2023   NA 141 09/07/2023   K 3.7 09/07/2023   CL 106 09/07/2023   CREATININE 0.93 09/07/2023   BUN 18 09/07/2023   CO2 28 09/07/2023   TSH 1.20 02/18/2023   HGBA1C 5.2 02/18/2023   BP Readings from Last 3 Encounters:  01/05/24 120/74  09/07/23 118/76  08/16/23 100/66    ASSESSMENT AND PLAN:  Discussed the following assessment and plan:  Arthritis of right knee  Pre-operative clearance No medical conditions that would increase risk of surgery . Weight and age considerations . Thus optimized medical cardiac status. Weight management stable. BMI 40 Did well with post left knee rehab .  Form to Dr Dion Saucier office.  -Patient advised to return or notify health care team  if  new concerns arise.  Patient Instructions  Good to see you today . Will send form  to your surgeon office .  Optimized medical and  cardiovascular.    Neta Mends. Doreen Garretson M.D.

## 2024-01-05 NOTE — Patient Instructions (Signed)
Good to see you today . Will send form  to your surgeon office .  Optimized medical and  cardiovascular.

## 2024-01-06 DIAGNOSIS — Z471 Aftercare following joint replacement surgery: Secondary | ICD-10-CM | POA: Diagnosis not present

## 2024-01-06 DIAGNOSIS — R2681 Unsteadiness on feet: Secondary | ICD-10-CM | POA: Diagnosis not present

## 2024-01-06 DIAGNOSIS — M6281 Muscle weakness (generalized): Secondary | ICD-10-CM | POA: Diagnosis not present

## 2024-01-06 DIAGNOSIS — M170 Bilateral primary osteoarthritis of knee: Secondary | ICD-10-CM | POA: Diagnosis not present

## 2024-02-03 DIAGNOSIS — M1711 Unilateral primary osteoarthritis, right knee: Secondary | ICD-10-CM | POA: Diagnosis not present

## 2024-02-03 DIAGNOSIS — M6281 Muscle weakness (generalized): Secondary | ICD-10-CM | POA: Diagnosis not present

## 2024-02-03 DIAGNOSIS — R262 Difficulty in walking, not elsewhere classified: Secondary | ICD-10-CM | POA: Diagnosis not present

## 2024-02-03 DIAGNOSIS — M25661 Stiffness of right knee, not elsewhere classified: Secondary | ICD-10-CM | POA: Diagnosis not present

## 2024-02-13 DIAGNOSIS — L819 Disorder of pigmentation, unspecified: Secondary | ICD-10-CM | POA: Diagnosis not present

## 2024-02-13 DIAGNOSIS — L570 Actinic keratosis: Secondary | ICD-10-CM | POA: Diagnosis not present

## 2024-02-13 DIAGNOSIS — M1711 Unilateral primary osteoarthritis, right knee: Secondary | ICD-10-CM | POA: Diagnosis not present

## 2024-02-13 DIAGNOSIS — M25561 Pain in right knee: Secondary | ICD-10-CM | POA: Diagnosis not present

## 2024-03-08 DIAGNOSIS — G8918 Other acute postprocedural pain: Secondary | ICD-10-CM | POA: Diagnosis not present

## 2024-03-08 DIAGNOSIS — M1711 Unilateral primary osteoarthritis, right knee: Secondary | ICD-10-CM | POA: Diagnosis not present

## 2024-04-16 DIAGNOSIS — R278 Other lack of coordination: Secondary | ICD-10-CM | POA: Diagnosis not present

## 2024-04-19 DIAGNOSIS — R278 Other lack of coordination: Secondary | ICD-10-CM | POA: Diagnosis not present

## 2024-05-02 DIAGNOSIS — R278 Other lack of coordination: Secondary | ICD-10-CM | POA: Diagnosis not present

## 2024-05-04 DIAGNOSIS — R278 Other lack of coordination: Secondary | ICD-10-CM | POA: Diagnosis not present

## 2024-05-07 DIAGNOSIS — R278 Other lack of coordination: Secondary | ICD-10-CM | POA: Diagnosis not present

## 2024-05-09 DIAGNOSIS — R278 Other lack of coordination: Secondary | ICD-10-CM | POA: Diagnosis not present

## 2024-05-14 DIAGNOSIS — R278 Other lack of coordination: Secondary | ICD-10-CM | POA: Diagnosis not present

## 2024-05-16 DIAGNOSIS — R278 Other lack of coordination: Secondary | ICD-10-CM | POA: Diagnosis not present

## 2024-05-21 DIAGNOSIS — R278 Other lack of coordination: Secondary | ICD-10-CM | POA: Diagnosis not present

## 2024-06-16 DIAGNOSIS — Z0100 Encounter for examination of eyes and vision without abnormal findings: Secondary | ICD-10-CM | POA: Diagnosis not present

## 2024-06-16 DIAGNOSIS — H524 Presbyopia: Secondary | ICD-10-CM | POA: Diagnosis not present

## 2024-06-29 NOTE — Progress Notes (Signed)
    Natasha [name] D.[facility] [address] [place] [address] Phone: [phone]   Assessment and Plan:     1. Acute bursitis of right shoulder (Primary) 2. Chronic right shoulder pain -Chronic exacerbation, subsequent visit - Intermittent flares of right shoulder pain most consistent with subacromial bursitis likely due to side sleeping, physical activity. - Patient's most recent flare has resolved with rest - Use Tylenol 500 to 1000 mg tablets 2-3 times a day for day-to-day pain relief - Use naproxen 220 mg 1 to 2 tablets daily as needed for pain.  Recommend limiting chronic NSAIDs to 1-2 doses per week to prevent long-term side effects.  Offered meloxicam  course, however patient feels that meloxicam  was not beneficial for other prior conditions - Start HEP for range of motion whenever exacerbation occurs - May use ice/heat as needed for pain relief - Continue physical activity as tolerated  15 additional minutes spent for educating Therapeutic Home Exercise Program.  This included exercises focusing on stretching, strengthening, with focus on eccentric aspects.   Long term goals include an improvement in range of motion, strength, endurance as well as avoiding reinjury. Patient's frequency would include in 1-2 times a day, 3-5 times a week for a duration of 6-12 weeks. Proper technique shown and discussed handout in great detail with ATC.  All questions were discussed and answered.      Pertinent previous records reviewed include none  Follow Up: As needed   Subjective:   I, Natasha [name], am serving as a Neurosurgeon for Doctor Morene Mace  Chief Complaint: right shoulder pain   HPI:   07/02/2024 Patient is a 73 year old female with right shoulder pain. Patient states right shoulder intermittent pain. Decreased ROM. No pain at the moment      Relevant Historical Information: None pertinent  Additional pertinent review of systems  negative.   Current Outpatient Medications:    Multiple Vitamin (MULTIVITAMIN ADULT PO), Take by mouth., Disp: , Rfl:    Objective:     Vitals:   07/02/24 1406  Pulse: 78  SpO2: 98%  Weight: 245 lb (111.1 kg)  Height: 5' 5 (1.651 m)      Body mass index is 40.77 kg/m.    Physical Exam:    Gen: Appears well, nad, nontoxic and pleasant Neuro:sensation intact, strength is 5/5 with df/pf/inv/ev, muscle tone wnl Skin: no suspicious lesion or defmority Psych: A&O, appropriate mood and affect  Right shoulder:  No deformity, swelling or muscle wasting No scapular winging FF 180, abd 180, int 0, ext 90 NTTP over the , clavicle, ac, coracoid, biceps groove, humerus, deltoid, trapezius, cervical spine Neg neer, hawkins, empty can, obriens, crossarm, subscap liftoff, speeds Neg ant drawer, sulcus sign, apprehension Negative Spurling's test bilat FROM of neck    Electronically signed by:  Odis Mace D.[facility] 2:39 PM 07/02/24

## 2024-07-02 ENCOUNTER — Ambulatory Visit: Admitting: Sports Medicine

## 2024-07-02 VITALS — HR 78 | Ht 65.0 in | Wt 245.0 lb

## 2024-07-02 DIAGNOSIS — G8929 Other chronic pain: Secondary | ICD-10-CM

## 2024-07-02 DIAGNOSIS — M7551 Bursitis of right shoulder: Secondary | ICD-10-CM | POA: Diagnosis not present

## 2024-07-02 DIAGNOSIS — M25511 Pain in right shoulder: Secondary | ICD-10-CM | POA: Diagnosis not present

## 2024-07-02 NOTE — Patient Instructions (Signed)
 Thank you for coming in today  Your physical exam and description of pain are most consistent with intermittent flares of subacromial bursitis, which is an irritation of the space around the rotator cuff.  This often is flared by sleeping on your side or physical activity.  Start home exercises to maintain range of motion for shoulder.  - Use Tylenol 500 to 1000 mg tablets 2-3 times a day for day-to-day pain relief  - Use naproxen 220 mg 1 to 2 tablets daily as needed for pain.  Recommend limiting chronic NSAIDs to 1-2 doses per week to prevent long-term side effects.  Recommend using ice in the first 48 hours after a new injury/flare, then recommend transitioning to heat after 48 hours.  Follow-up as needed and we could discuss physical therapy versus steroid injection versus imaging studies.

## 2024-08-18 ENCOUNTER — Encounter: Payer: Self-pay | Admitting: *Deleted

## 2024-08-18 LAB — AMB RESULTS CONSOLE CBG: Glucose: 87

## 2024-08-21 ENCOUNTER — Encounter: Payer: Self-pay | Admitting: Family Medicine

## 2024-08-21 ENCOUNTER — Ambulatory Visit (INDEPENDENT_AMBULATORY_CARE_PROVIDER_SITE_OTHER): Admitting: Family Medicine

## 2024-08-21 DIAGNOSIS — Z0000 Encounter for general adult medical examination without abnormal findings: Secondary | ICD-10-CM

## 2024-08-21 NOTE — Progress Notes (Addendum)
 PATIENT CHECK-IN and HEALTH RISK ASSESSMENT QUESTIONNAIRE:  -completed by phone/video for upcoming Medicare Preventive Visit  Pre-Visit Check-in: 1)Vitals (height, wt, BP, etc) - record in vitals section for visit on day of visit Request home vitals (wt, BP, etc.) and enter into vitals, THEN update Vital Signs SmartPhrase below at the top of the HPI. See below.  2)Review and Update Medications, Allergies PMH, Surgeries, Social history in Epic 3)Hospitalizations in the last year with date/reason? NO   4)Review and Update Care Team (patient's specialists) in Epic 5) Complete PHQ9 in Epic  6) Complete Fall Screening in Epic 7)Review all Health Maintenance Due and order if not done.  Medicare Wellness Patient Questionnaire:  Answer theses question about your habits: How often do you have a drink containing alcohol?Yes, once a month  How many drinks containing alcohol do you have on a typical day when you are drinking?1 drink  How often do you have six or more drinks on one occasion? NO  Have you ever smoked?NO  Quit date if applicable? Na  How many packs a day do/did you smoke?  NA  Do you use smokeless tobacco?Na  Do you use an illicit drugs?NA  On average, how many days per week do you engage in moderate to strenuous exercise (like a brisk walk)?2 times a week  On average, how many minutes do you engage in exercise at this level?40 minutes - swimming Are you sexually active? Yes Number of partners?One  Typical breakfast: bagel juice and coffee Typical lunch:Chicken salad  Typical dinner:Fish potatoes rolls  Typical snacks: Popcorn, nuts ice cream   Beverages: Diet coke , ice tea, coffee, juice, water   Answer theses question about your everyday activities: Can you perform most household chores?Yes  Are you deaf or have significant trouble hearing? No  Do you feel that you have a problem with memory?No  Do you feel safe at home?Yes  Last dentist visit?2 months ago  8. Do you have  any difficulty performing your everyday activities?No  Are you having any difficulty walking, taking medications on your own, and or difficulty managing daily home needs?NO  Do you have difficulty walking or climbing stairs?No  Do you have difficulty dressing or bathing?No  Do you have difficulty doing errands alone such as visiting a doctor's office or shopping?NO  Do you currently have any difficulty preparing food and eating?NO  Do you currently have any difficulty using the toilet?NO  Do you have any difficulty managing your finances?NO  Do you have any difficulties with housekeeping of managing your housekeeping?No    Do you have Advanced Directives in place (Living Will, Healthcare Power or Attorney)? Yes    Last eye Exam and location?Jan 2025, [place]  [facility]    Do you currently use prescribed or non-prescribed narcotic or opioid pain medications?No   Do you have a history or close family history of breast, ovarian, tubal or peritoneal cancer or a family member with BRCA (breast cancer susceptibility 1 and 2) gene mutations?   Nurse/Assistant Credentials/time stamp: Leah A.Wright CMA 10:48 am     ----------------------------------------------------------------------------------------------------------------------------------------------------------------------------------------------------------------------  Because this visit was a virtual/telehealth visit, some criteria may be missing or patient reported. Any vitals not documented were not able to be obtained and vitals that have been documented are patient reported.    MEDICARE ANNUAL PREVENTIVE VISIT WITH PROVIDER: (Welcome to Medicare, initial annual wellness or annual wellness exam)  Virtual Visit via Phone Note  I connected with Natasha [name] on 08/21/24  by phone and verified that I am speaking with the correct person using two identifiers. Could not get video working and she preferred to finish by phone.    Location patient: home Location provider:work or home office Persons participating in the virtual visit: patient, provider  Concerns and/or follow up today: has had two knee replacements and reports they have worked out splendidly and her knees are great.    See HM section in Epic for other details of completed HM.    ROS: negative for report of fevers, unintentional weight loss, vision changes, vision loss, hearing loss or change, chest pain, sob, hemoptysis, melena, hematochezia, hematuria, falls, bleeding or bruising, thoughts of suicide or self harm, memory loss  Patient-completed extensive health risk assessment - reviewed and discussed with the patient: See Health Risk Assessment completed with patient prior to the visit either above or in recent phone note. This was reviewed in detailed with the patient today and appropriate recommendations, orders and referrals were placed as needed per Summary below and patient instructions.   Review of Medical History: -PMH, PSH, Family History and current specialty and care providers reviewed and updated and listed below   Patient Care Team: Panosh, Apolinar POUR, MD as PCP - General   Past Medical History:  Diagnosis Date   Allergic rhinitis    Arthritis 2019   Normal cardiac stress test     Past Surgical History:  Procedure Laterality Date   MYRINGOTOMY     right 7 th grade    Social History   Socioeconomic History   Marital status: Married    Spouse name: Not on file   Number of children: Not on file   Years of education: Not on file   Highest education level: Doctorate  Occupational History   Not on file  Tobacco Use   Smoking status: Never   Smokeless tobacco: Never  Vaping Use   Vaping status: Never Used  Substance and Sexual Activity   Alcohol use: Yes    Alcohol/week: 1.0 standard drink of alcohol    Types: 1 Standard drinks or equivalent per week    Comment: 1 every 2 weeks    Drug use: No   Sexual activity: Not  on file  Other Topics Concern   Not on file  Social History Narrative   HH  of 3   Son adopted from [place] now 62 has FAS and microcephaly   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    No tob   Social Drivers of Corporate investment banker Strain: Low Risk  (03/30/2024)   Overall Financial Resource Strain (CARDIA)    Difficulty of Paying Living Expenses: Not hard at all  Food Insecurity: No Food Insecurity (03/30/2024)   Hunger Vital Sign    Worried About Running Out of Food in the Last Year: Never true    Ran Out of Food in the Last Year: Never true  Transportation Needs: No Transportation Needs (03/30/2024)   PRAPARE - Administrator, Civil Service (Medical): No    Lack of Transportation (Non-Medical): No  Physical Activity: Insufficiently Active (03/30/2024)   Exercise Vital Sign    Days of Exercise per Week: 2 days    Minutes of Exercise per Session: 60 min  Stress: No Stress Concern Present (03/30/2024)   [employer] of Occupational Health - Occupational Stress Questionnaire    Feeling of Stress : Only a little  Social Connections: Socially Integrated (03/30/2024)  Social Connection and Isolation Panel    Frequency of Communication with Friends and Family: Never    Frequency of Social Gatherings with Friends and Family: Three times a week    Attends Religious Services: More than 4 times per year    Active Member of Clubs or Organizations: Yes    Attends Banker Meetings: More than 4 times per year    Marital Status: Married  Catering manager Violence: Not At Risk (04/01/2023)   Humiliation, Afraid, Rape, and Kick questionnaire    Fear of Current or Ex-Partner: No    Emotionally Abused: No    Physically Abused: No    Sexually Abused: No    Family History  Problem Relation Age of Onset   Lung disease Father    Cancer Father        Colon   Colon polyps Father    Multiple sclerosis Mother    Scleroderma Sister     Parkinson's disease Maternal Grandmother    Colon cancer Neg Hx    Breast cancer Neg Hx     Current Outpatient Medications on File Prior to Visit  Medication Sig Dispense Refill   Multiple Vitamin (MULTIVITAMIN ADULT PO) Take by mouth.     No current facility-administered medications on file prior to visit.    No Known Allergies     Physical Exam Vitals requested from patient and listed below if patient had equipment and was able to obtain at home for this virtual visit: There were no vitals filed for this visit. Estimated body mass index is 40.77 kg/m as calculated from the following:   Height as of 07/02/24: 5' 5 (1.651 m).   Weight as of 07/02/24: 245 lb (111.1 kg).  EKG (optional): deferred due to virtual visit  GENERAL: alert, oriented, no acute distress detected, full vision exam deferred due to pandemic and/or virtual encounter  PSYCH/NEURO: pleasant and cooperative, no obvious depression or anxiety, speech and thought processing grossly intact, Cognitive function grossly intact  Flowsheet Row Office Visit from 08/21/2024 in [facility] [place]  PHQ-9 Total Score 0        08/21/2024   10:40 AM 08/16/2023    9:09 AM 04/01/2023   10:25 AM 02/16/2023   11:46 AM 03/30/2022   10:57 AM  Depression screen PHQ 2/9  Decreased Interest 0 0 0 0 0  Down, Depressed, Hopeless 0 0 0 1 0  PHQ - 2 Score 0 0 0 1 0  Altered sleeping 0 0 0 3   Tired, decreased energy 0 1 0 1   Change in appetite 0 0 0 1   Feeling bad or failure about yourself  0 0 0 0   Trouble concentrating 0 0 0 1   Moving slowly or fidgety/restless 0 0 0 0   Suicidal thoughts 0 0 0 0   PHQ-9 Score 0 1 0 7   Difficult doing work/chores  Not difficult at all Not difficult at all Not difficult at all        03/30/2023    5:23 PM 04/01/2023   10:29 AM 08/16/2023    9:08 AM 03/30/2024   12:01 PM 08/21/2024   10:40 AM  Fall Risk  Falls in the past year? 0 0 0 1 0  Was there an injury with  Fall?  0 0  0  Fall Risk Category Calculator  0 0  0  Patient at Risk for Falls Due to  No Fall Risks  No Fall Risks    Fall risk Follow up  Falls prevention discussed Falls evaluation completed  Falls evaluation completed     SUMMARY AND PLAN:  Medicare annual wellness visit, subsequent  Discussed applicable health maintenance/preventive health measures and advised and referred or ordered per patient preferences: -discussed vaccines due, she plans to get at the pharmacy -also discussed mammogram which she says she does with [facility] yearly -discussed bone density test - she declined for now, was normal in 2022 Health Maintenance  Topic Date Due   Influenza Vaccine  07/06/2024   COVID-19 Vaccine (7 - 2025-26 season) 08/06/2024   Hepatitis C Screening  08/21/2025 (Originally 09/11/1969)   Medicare Annual Wellness (AWV)  08/21/2025   Mammogram  08/29/2025   Colonoscopy  09/21/2028   DTaP/Tdap/Td (3 - Tdap) 10/03/2028   Pneumococcal Vaccine: 50+ Years  Completed   DEXA SCAN  Completed   Zoster Vaccines- Shingrix   Completed   HPV VACCINES  Aged Out   Meningococcal B Vaccine  Aged Out      Education and counseling on the following was provided based on the above review of health and a plan/checklist for the patient, along with additional information discussed, was provided for the patient in the patient instructions :   -Advised and counseled on a healthy lifestyle  -Reviewed patient's current diet. Advised and counseled on a whole foods based healthy diet. A summary of a healthy diet was provided in the Patient Instructions.  -reviewed patient's current physical activity level and discussed exercise guidelines for adults. Further resources provided in patient instructions.  -Advise yearly dental visits at minimum and regular eye exams   Follow up: see patient instructions     Patient Instructions  I really enjoyed getting to talk with you today! I am available on Tuesdays and  Thursdays for virtual visits if you have any questions or concerns, or if I can be of any further assistance.   CHECKLIST FROM ANNUAL WELLNESS VISIT:  -Follow up (please call to schedule if not scheduled after visit):   -yearly for annual wellness visit with primary care office  Here is a list of your preventive care/health maintenance measures and the plan for each if any are due:  PLAN For any measures below that may be due:    1. Please schedule your mammogram when due   2. Can get the flu vaccine at the office or at the pharmacy. Can get other vaccines at the pharmacy. Please let us  know if you get any vaccines at the pharmacy so that we can upstate your record.   3. Please let us  know if you decide to do the bone density test at any point.  Health Maintenance  Topic Date Due   Influenza Vaccine  07/06/2024   COVID-19 Vaccine (7 - 2025-26 season) 08/06/2024   Hepatitis C Screening  08/21/2025 (Originally 09/11/1969)   Medicare Annual Wellness (AWV)  08/21/2025   Mammogram  08/29/2025   Colonoscopy  09/21/2028   DTaP/Tdap/Td (3 - Tdap) 10/03/2028   Pneumococcal Vaccine: 50+ Years  Completed   DEXA SCAN  Completed   Zoster Vaccines- Shingrix   Completed   HPV VACCINES  Aged Out   Meningococcal B Vaccine  Aged Out    -See a dentist at least yearly  -Get your eyes checked and then per your eye specialist's recommendations  -Other issues addressed today:   -I have included below further information regarding a healthy whole foods based diet, physical activity guidelines for adults,  stress management and opportunities for social connections. I hope you find this information useful.   -----------------------------------------------------------------------------------------------------------------------------------------------------------------------------------------------------------------------------------------------------------    NUTRITION: -eat real food: lots of  colorful vegetables (half the plate) and fruits -5-7 servings of vegetables and fruits per day (fresh or steamed is best), exp. 2 servings of vegetables with lunch and dinner and 2 servings of fruit per day. Berries and greens such as kale and collards are great choices.  -consume on a regular basis:  fresh fruits, fresh veggies, fish, nuts, seeds, healthy oils (such as olive oil, avocado oil), whole grains (make sure for bread/pasta/crackers/etc., that the first ingredient on label contains the word whole), legumes. -can eat small amounts of dairy and lean meat (no larger than the palm of your hand), but avoid processed meats such as ham, bacon, lunch meat, etc. -drink water -try to avoid fast food and pre-packaged foods, processed meat, ultra processed foods/beverages (donuts, candy, etc.) -most experts advise limiting sodium to < 2300mg  per day, should limit further is any chronic conditions such as high blood pressure, heart disease, diabetes, etc. The American Heart Association advised that < 1500mg  is is ideal -try to avoid foods/beverages that contain any ingredients with names you do not recognize  -try to avoid foods/beverages  with added sugar or sweeteners/sweets  -try to avoid sweet drinks (including diet drinks): soda, juice, Gatorade, sweet tea, power drinks, diet drinks -try to avoid white rice, white bread, pasta (unless whole grain)  EXERCISE GUIDELINES FOR ADULTS: -if you wish to increase your physical activity, do so gradually and with the approval of your doctor -STOP and seek medical care immediately if you have any chest pain, chest discomfort or trouble breathing when starting or increasing exercise  -move and stretch your body, legs, feet and arms when sitting for long periods -Physical activity guidelines for optimal health in adults: -get at least 150 minutes per week of moderate exercise (can talk, but not sing); this is about 20-30 minutes of sustained activity 5-7 days  per week or two 10-15 minute episodes of sustained activity 5-7 days per week -do some muscle building/resistance training/strength training at least 2 days per week  -balance exercises 3+ days per week:   Stand somewhere where you have something sturdy to hold onto if you lose balance    1) lift up on toes, then back down, start with 5x per day and work up to 20x   2) stand and lift one leg straight out to the side so that foot is a few inches of the floor, start with 5x each side and work up to 20x each side   3) stand on one foot, start with 5 seconds each side and work up to 20 seconds on each side  If you need ideas or help with getting more active:  -Silver sneakers https://tools.silversneakers.com  -Walk with a Doc: http://www.duncan-williams.com/  -try to include resistance (weight lifting/strength building) and balance exercises twice per week: or the following link for ideas: http://castillo-powell.com/  BuyDucts.dk  STRESS MANAGEMENT: -can try meditating, or just sitting quietly with deep breathing while intentionally relaxing all parts of your body for 5 minutes daily -if you need further help with stress, anxiety or depression please follow up with your primary doctor or contact the wonderful folks at [facility]: [phone]  SOCIAL CONNECTIONS: -options in [place] if you wish to engage in more social and exercise related activities:  -Silver sneakers https://tools.silversneakers.com  -Walk with a Doc: http://www.duncan-williams.com/  -Check out the [facility]  Active Adults 50+ section on the [place] of [employer] (hiking clubs, book clubs, cards and games, chess, exercise classes, aquatic classes and much more) - see the website for details: https://www.[place].gov/departments/parks-recreation/active-adults50  -YouTube has lots of exercise videos for different  ages and abilities as well  -[facility] (a variety of indoor and outdoor inperson activities for adults). 7816868626. [address]  -Virtual Online Classes (a variety of topics): see seniorplanet.org or call [phone]  -consider volunteering at a school, hospice center, church, senior center or elsewhere            Chiquita JONELLE Cramp, DO

## 2024-08-21 NOTE — Patient Instructions (Signed)
 I really enjoyed getting to talk with you today! I am available on Tuesdays and Thursdays for virtual visits if you have any questions or concerns, or if I can be of any further assistance.   CHECKLIST FROM ANNUAL WELLNESS VISIT:  -Follow up (please call to schedule if not scheduled after visit):   -yearly for annual wellness visit with primary care office  Here is a list of your preventive care/health maintenance measures and the plan for each if any are due:  PLAN For any measures below that may be due:    1. Please schedule your mammogram when due   2. Can get the flu vaccine at the office or at the pharmacy. Can get other vaccines at the pharmacy. Please let us  know if you get any vaccines at the pharmacy so that we can upstate your record.   3. Please let us  know if you decide to do the bone density test at any point.  Health Maintenance  Topic Date Due   Influenza Vaccine  07/06/2024   COVID-19 Vaccine (7 - 2025-26 season) 08/06/2024   Hepatitis C Screening  08/21/2025 (Originally 09/11/1969)   Medicare Annual Wellness (AWV)  08/21/2025   Mammogram  08/29/2025   Colonoscopy  09/21/2028   DTaP/Tdap/Td (3 - Tdap) 10/03/2028   Pneumococcal Vaccine: 50+ Years  Completed   DEXA SCAN  Completed   Zoster Vaccines- Shingrix   Completed   HPV VACCINES  Aged Out   Meningococcal B Vaccine  Aged Out    -See a dentist at least yearly  -Get your eyes checked and then per your eye specialist's recommendations  -Other issues addressed today:   -I have included below further information regarding a healthy whole foods based diet, physical activity guidelines for adults, stress management and opportunities for social connections. I hope you find this information useful.    -----------------------------------------------------------------------------------------------------------------------------------------------------------------------------------------------------------------------------------------------------------    NUTRITION: -eat real food: lots of colorful vegetables (half the plate) and fruits -5-7 servings of vegetables and fruits per day (fresh or steamed is best), exp. 2 servings of vegetables with lunch and dinner and 2 servings of fruit per day. Berries and greens such as kale and collards are great choices.  -consume on a regular basis:  fresh fruits, fresh veggies, fish, nuts, seeds, healthy oils (such as olive oil, avocado oil), whole grains (make sure for bread/pasta/crackers/etc., that the first ingredient on label contains the word whole), legumes. -can eat small amounts of dairy and lean meat (no larger than the palm of your hand), but avoid processed meats such as ham, bacon, lunch meat, etc. -drink water -try to avoid fast food and pre-packaged foods, processed meat, ultra processed foods/beverages (donuts, candy, etc.) -most experts advise limiting sodium to < 2300mg  per day, should limit further is any chronic conditions such as high blood pressure, heart disease, diabetes, etc. The American Heart Association advised that < 1500mg  is is ideal -try to avoid foods/beverages that contain any ingredients with names you do not recognize  -try to avoid foods/beverages  with added sugar or sweeteners/sweets  -try to avoid sweet drinks (including diet drinks): soda, juice, Gatorade, sweet tea, power drinks, diet drinks -try to avoid white rice, white bread, pasta (unless whole grain)  EXERCISE GUIDELINES FOR ADULTS: -if you wish to increase your physical activity, do so gradually and with the approval of your doctor -STOP and seek medical care immediately if you have any chest pain, chest discomfort or trouble breathing when starting or  increasing exercise  -  move and stretch your body, legs, feet and arms when sitting for long periods -Physical activity guidelines for optimal health in adults: -get at least 150 minutes per week of moderate exercise (can talk, but not sing); this is about 20-30 minutes of sustained activity 5-7 days per week or two 10-15 minute episodes of sustained activity 5-7 days per week -do some muscle building/resistance training/strength training at least 2 days per week  -balance exercises 3+ days per week:   Stand somewhere where you have something sturdy to hold onto if you lose balance    1) lift up on toes, then back down, start with 5x per day and work up to 20x   2) stand and lift one leg straight out to the side so that foot is a few inches of the floor, start with 5x each side and work up to 20x each side   3) stand on one foot, start with 5 seconds each side and work up to 20 seconds on each side  If you need ideas or help with getting more active:  -Silver sneakers https://tools.silversneakers.com  -Walk with a Doc: http://www.duncan-williams.com/  -try to include resistance (weight lifting/strength building) and balance exercises twice per week: or the following link for ideas: http://castillo-powell.com/  BuyDucts.dk  STRESS MANAGEMENT: -can try meditating, or just sitting quietly with deep breathing while intentionally relaxing all parts of your body for 5 minutes daily -if you need further help with stress, anxiety or depression please follow up with your primary doctor or contact the wonderful folks at [facility]: [phone]  SOCIAL CONNECTIONS: -options in [place] if you wish to engage in more social and exercise related activities:  -Silver sneakers https://tools.silversneakers.com  -Walk with a Doc: http://www.duncan-williams.com/  -Check out the [facility] An Active Adults 50+  section on the [place] of [employer] (hiking clubs, book clubs, cards and games, chess, exercise classes, aquatic classes and much more) - see the website for details: https://www.[place].gov/departments/parks-recreation/active-adults50  -YouTube has lots of exercise videos for different ages and abilities as well  -[facility] (a variety of indoor and outdoor inperson activities for adults). [phone]. [address].  -Virtual Online Classes (a variety of topics): see seniorplanet.org or call [phone]  -consider volunteering at a school, hospice center, church, senior center or elsewhere

## 2024-08-29 ENCOUNTER — Ambulatory Visit (INDEPENDENT_AMBULATORY_CARE_PROVIDER_SITE_OTHER)

## 2024-08-29 DIAGNOSIS — Z23 Encounter for immunization: Secondary | ICD-10-CM | POA: Diagnosis not present

## 2024-09-05 DIAGNOSIS — R92323 Mammographic fibroglandular density, bilateral breasts: Secondary | ICD-10-CM | POA: Diagnosis not present

## 2024-09-05 DIAGNOSIS — Z1231 Encounter for screening mammogram for malignant neoplasm of breast: Secondary | ICD-10-CM | POA: Diagnosis not present

## 2024-11-09 DIAGNOSIS — Z471 Aftercare following joint replacement surgery: Secondary | ICD-10-CM | POA: Diagnosis not present

## 2024-11-09 DIAGNOSIS — Z96653 Presence of artificial knee joint, bilateral: Secondary | ICD-10-CM | POA: Diagnosis not present

## 2024-12-19 ENCOUNTER — Ambulatory Visit (INDEPENDENT_AMBULATORY_CARE_PROVIDER_SITE_OTHER): Admitting: Internal Medicine

## 2024-12-19 ENCOUNTER — Encounter: Payer: Self-pay | Admitting: Internal Medicine

## 2024-12-19 VITALS — BP 128/80 | HR 72 | Temp 97.1°F | Ht 65.16 in | Wt 245.0 lb

## 2024-12-19 DIAGNOSIS — R252 Cramp and spasm: Secondary | ICD-10-CM | POA: Diagnosis not present

## 2024-12-19 DIAGNOSIS — Z96653 Presence of artificial knee joint, bilateral: Secondary | ICD-10-CM | POA: Diagnosis not present

## 2024-12-19 DIAGNOSIS — Z0000 Encounter for general adult medical examination without abnormal findings: Secondary | ICD-10-CM | POA: Diagnosis not present

## 2024-12-19 DIAGNOSIS — E786 Lipoprotein deficiency: Secondary | ICD-10-CM | POA: Diagnosis not present

## 2024-12-19 DIAGNOSIS — E559 Vitamin D deficiency, unspecified: Secondary | ICD-10-CM

## 2024-12-19 DIAGNOSIS — M1990 Unspecified osteoarthritis, unspecified site: Secondary | ICD-10-CM

## 2024-12-19 LAB — LIPID PANEL
Cholesterol: 159 mg/dL (ref 28–200)
HDL: 37.1 mg/dL — ABNORMAL LOW
LDL Cholesterol: 100 mg/dL — ABNORMAL HIGH (ref 10–99)
NonHDL: 121.48
Total CHOL/HDL Ratio: 4
Triglycerides: 106 mg/dL (ref 10.0–149.0)
VLDL: 21.2 mg/dL (ref 0.0–40.0)

## 2024-12-19 LAB — CBC WITH DIFFERENTIAL/PLATELET
Basophils Absolute: 0 K/uL (ref 0.0–0.1)
Basophils Relative: 1.1 % (ref 0.0–3.0)
Eosinophils Absolute: 0.5 K/uL (ref 0.0–0.7)
Eosinophils Relative: 10.3 % — ABNORMAL HIGH (ref 0.0–5.0)
HCT: 44.3 % (ref 36.0–46.0)
Hemoglobin: 14.8 g/dL (ref 12.0–15.0)
Lymphocytes Relative: 22.4 % (ref 12.0–46.0)
Lymphs Abs: 1 K/uL (ref 0.7–4.0)
MCHC: 33.4 g/dL (ref 30.0–36.0)
MCV: 95.6 fl (ref 78.0–100.0)
Monocytes Absolute: 0.5 K/uL (ref 0.1–1.0)
Monocytes Relative: 11 % (ref 3.0–12.0)
Neutro Abs: 2.5 K/uL (ref 1.4–7.7)
Neutrophils Relative %: 55.2 % (ref 43.0–77.0)
Platelets: 252 K/uL (ref 150.0–400.0)
RBC: 4.64 Mil/uL (ref 3.87–5.11)
RDW: 13.1 % (ref 11.5–15.5)
WBC: 4.6 K/uL (ref 4.0–10.5)

## 2024-12-19 LAB — COMPREHENSIVE METABOLIC PANEL WITH GFR
ALT: 12 U/L (ref 3–35)
AST: 11 U/L (ref 5–37)
Albumin: 4.5 g/dL (ref 3.5–5.2)
Alkaline Phosphatase: 86 U/L (ref 39–117)
BUN: 15 mg/dL (ref 6–23)
CO2: 30 meq/L (ref 19–32)
Calcium: 9.8 mg/dL (ref 8.4–10.5)
Chloride: 105 meq/L (ref 96–112)
Creatinine, Ser: 0.83 mg/dL (ref 0.40–1.20)
GFR: 71 mL/min
Glucose, Bld: 96 mg/dL (ref 70–99)
Potassium: 4.2 meq/L (ref 3.5–5.1)
Sodium: 139 meq/L (ref 135–145)
Total Bilirubin: 0.6 mg/dL (ref 0.2–1.2)
Total Protein: 7.3 g/dL (ref 6.0–8.3)

## 2024-12-19 LAB — TSH: TSH: 1.05 u[IU]/mL (ref 0.35–5.50)

## 2024-12-19 LAB — VITAMIN D 25 HYDROXY (VIT D DEFICIENCY, FRACTURES): VITD: 23.07 ng/mL — ABNORMAL LOW (ref 30.00–100.00)

## 2024-12-19 LAB — HEMOGLOBIN A1C: Hgb A1c MFr Bld: 5.2 % (ref 4.6–6.5)

## 2024-12-19 LAB — MAGNESIUM: Magnesium: 2.1 mg/dL (ref 1.5–2.5)

## 2024-12-19 NOTE — Patient Instructions (Signed)
 Good to see you today . Continue lifestyle intervention healthy eating and exercise .  Glad joint pains are better .  Lab today    checking sugars and vit d  magnesium etc .

## 2024-12-19 NOTE — Progress Notes (Signed)
 "  Chief Complaint  Patient presents with   Annual Exam    Pt is not fasted. She reports more cramps on foot and leg and would like to discuss antibiotic for dental work, insomnia and skin.     HPI: Patient  Natasha [name]  74 y.o. comes in today for Preventive Health Care visit    Hx r tka  has helped all of the  other  joints .   Dr Josefina  Sciatica better  Nocturnal foot leg cramps  no claudication in day  Sleep good with travel other some wakening s  Not taking any supplements  meds  Aging skin thinning   worrisome lesions bleeding  Health Maintenance  Topic Date Due   COVID-19 Vaccine (9 - 2025-26 season) 01/04/2025 (Originally 08/06/2024)   Hepatitis C Screening  08/21/2025 (Originally 09/11/1969)   Medicare Annual Wellness (AWV)  08/21/2025   Mammogram  08/29/2025   Colonoscopy  09/21/2028   DTaP/Tdap/Td (4 - Tdap) 10/03/2028   Pneumococcal Vaccine: 50+ Years  Completed   Influenza Vaccine  Completed   Bone Density Scan  Completed   Zoster Vaccines- Shingrix   Completed   Meningococcal B Vaccine  Aged Out   Health Maintenance Review LIFESTYLE:  Exercise:   getting better since knees.  Tobacco/ETS: n Alcohol:  social  Sugar beverages: ocass juice   Sleep: well on vacation  othewise  ocass insomnia and take tylenol pm . Drug use: no HH of  2 no  pets living now friends home  2+ car garage   adopted 64 yo son in townhome  near by   ROS:  GEN/ HEENT: No fever, significant weight changes sweats headaches vision problems hearing changes, CV/ PULM; No chest pain shortness of breath cough, syncope,edema  change in exercise tolerance. GI /GU: No adominal pain, vomiting, change in bowel habits. No blood in the stool. No significant GU symptoms. SKIN/HEME: ,no acute skin rashes suspicious lesions or bleeding. No lymphadenopathy, nodules, masses.  NEURO/ PSYCH:  No neurologic signs such as weakness numbness. No depression anxiety. IMM/ Allergy: No unusual infections.  Allergy  .   REST of 12 system review negative except as per HPI   Past Medical History:  Diagnosis Date   Allergic rhinitis    Arthritis 2019   Normal cardiac stress test     Past Surgical History:  Procedure Laterality Date   MYRINGOTOMY     right 7 th grade    Family History  Problem Relation Age of Onset   Lung disease Father    Cancer Father        Colon   Colon polyps Father    Multiple sclerosis Mother    Scleroderma Sister    Parkinson's disease Maternal Grandmother    Colon cancer Neg Hx    Breast cancer Neg Hx     Social History   Socioeconomic History   Marital status: Married    Spouse name: Not on file   Number of children: Not on file   Years of education: Not on file   Highest education level: Doctorate  Occupational History   Not on file  Tobacco Use   Smoking status: Never   Smokeless tobacco: Never  Vaping Use   Vaping status: Never Used  Substance and Sexual Activity   Alcohol use: Yes    Alcohol/week: 1.0 standard drink of alcohol    Types: 1 Standard drinks or equivalent per week    Comment: 1 every 2 weeks  Drug use: No   Sexual activity: Not on file  Other Topics Concern   Not on file  Social History Narrative   HH  of 3   Son adopted from [place] now 4 has FAS and microcephaly   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    No tob   Social Drivers of Health   Tobacco Use: Low Risk (12/19/2024)   Patient History    Smoking Tobacco Use: Never    Smokeless Tobacco Use: Never    Passive Exposure: Not on file  Financial Resource Strain: Low Risk (03/30/2024)   Overall Financial Resource Strain (CARDIA)    Difficulty of Paying Living Expenses: Not hard at all  Food Insecurity: No Food Insecurity (03/30/2024)   Hunger Vital Sign    Worried About Running Out of Food in the Last Year: Never true    Ran Out of Food in the Last Year: Never true  Transportation Needs: No Transportation Needs (03/30/2024)   PRAPARE -  Administrator, Civil Service (Medical): No    Lack of Transportation (Non-Medical): No  Physical Activity: Insufficiently Active (03/30/2024)   Exercise Vital Sign    Days of Exercise per Week: 2 days    Minutes of Exercise per Session: 60 min  Stress: No Stress Concern Present (03/30/2024)   [employer] of Occupational Health - Occupational Stress Questionnaire    Feeling of Stress : Only a little  Social Connections: Socially Integrated (03/30/2024)   Social Connection and Isolation Panel    Frequency of Communication with Friends and Family: Never    Frequency of Social Gatherings with Friends and Family: Three times a week    Attends Religious Services: More than 4 times per year    Active Member of Clubs or Organizations: Yes    Attends Banker Meetings: More than 4 times per year    Marital Status: Married  Depression (PHQ2-9): Low Risk (08/21/2024)   Depression (PHQ2-9)    PHQ-2 Score: 0  Alcohol Screen: Low Risk (03/30/2024)   Alcohol Screen    Last Alcohol Screening Score (AUDIT): 1  Housing: Low Risk (03/30/2024)   Housing Stability Vital Sign    Unable to Pay for Housing in the Last Year: No    Number of Times Moved in the Last Year: 0    Homeless in the Last Year: No  Utilities: Not At Risk (04/01/2023)   AHC Utilities    Threatened with loss of utilities: No  Health Literacy: Not on file    Outpatient Medications Prior to Visit  Medication Sig Dispense Refill   Multiple Vitamin (MULTIVITAMIN ADULT PO) Take by mouth.     No facility-administered medications prior to visit.     EXAM:  BP 128/80 (BP Location: Left Arm, Patient Position: Sitting, Cuff Size: Large)   Pulse 72   Temp (!) 97.1 F (36.2 C) (Axillary)   Ht 5' 5.16 (1.655 m)   Wt 245 lb (111.1 kg) Comment: pt declined checking today.  SpO2 95%   BMI 40.57 kg/m   Body mass index is 40.57 kg/m. Wt Readings from Last 3 Encounters:  12/19/24 245 lb (111.1 kg)   07/02/24 245 lb (111.1 kg)  01/05/24 245 lb 12.8 oz (111.5 kg)  Reported reduction in weight at home   Physical Exam: Vital signs reviewed HZW:Uypd is a well-developed well-nourished alert cooperative    who appearsr stated age in no acute distress.  HEENT: normocephalic atraumatic ,  Eyes: PERRL EOM's full, conjunctiva clear, Nares: paten,t no deformity discharge or tenderness., Ears: no deformity EAC's clear TMs with normal landmarks. Mouth: clear OP, no lesions, edema.  Moist mucous membranes. Dentition in adequate repair. NECK: supple without masses, thyromegaly or bruits. CHEST/PULM:  Clear to auscultation and percussion breath sounds equal no wheeze , rales or rhonchi. No chest wall deformities or tenderness. Breast: normal by inspection . No dimpling, discharge, masses, tenderness or discharge . CV: PMI is nondisplaced, S1 S2 no gallops, murmurs, rubs.? Ocass click when supine  Peripheral pulses are full without delay.No JVD .  ABDOMEN: Bowel sounds normal nontender  No guard or rebound, no hepato splenomegal no CVA tenderness.  Extremtities:  No clubbing cyanosis or edema, no acute joint swelling or redness no focal atrophy well healed  scar knee NEURO:  Oriented x3, cranial nerves 3-12 appear to be intact, no obvious focal weakness,gait within normal limits no abnormal reflexes or asymmetrical SKIN: No acute rashes normal turgor, color, no bruising or petechiae. PSYCH: Oriented, good eye contact, no obvious depression anxiety, cognition and judgment appear normal. LN: no cervical axillary iadenopathy  Lab Results  Component Value Date   WBC 5.1 09/07/2023   HGB 14.1 09/07/2023   HCT 42.2 09/07/2023   PLT 280.0 09/07/2023   GLUCOSE 86 09/07/2023   CHOL 150 02/18/2023   TRIG 112.0 02/18/2023   HDL 34.70 (L) 02/18/2023   LDLCALC 93 02/18/2023   ALT 11 02/18/2023   AST 14 02/18/2023   NA 141 09/07/2023   K 3.7 09/07/2023   CL 106 09/07/2023   CREATININE 0.93 09/07/2023   BUN  18 09/07/2023   CO2 28 09/07/2023   TSH 1.20 02/18/2023   HGBA1C 5.2 02/18/2023    BP Readings from Last 3 Encounters:  12/19/24 128/80  01/05/24 120/74  09/07/23 118/76   Last vitamin D  Lab Results  Component Value Date   VD25OH 19.21 (L) 02/18/2023  Nf had  breakfast and coffee with sugar  this am   Lab plan  reviewed with patient    ASSESSMENT AND PLAN:  Discussed the following assessment and plan:    ICD-10-CM   1. Visit for preventive health examination  Z00.00     2. Leg cramps  R25.2 CBC with Differential/Platelet    Comprehensive metabolic panel with GFR    Lipid panel    TSH    Magnesium    Hemoglobin A1c    Vitamin D , 25-hydroxy   mosty nocturnal  as raming up ? ok to add topical mag  check labs today dont suspect vascular cause    3. 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 Arthritis  M19.90 CBC with Differential/Platelet    Comprehensive metabolic panel with GFR    Lipid panel    TSH    Magnesium    Hemoglobin A1c    Vitamin D , 25-hydroxy   back other sx better since  TKA    5. Vitamin D  deficiency  E55.9 CBC with Differential/Platelet    Comprehensive metabolic panel with GFR    Lipid panel    TSH    Magnesium    Hemoglobin A1c    Vitamin D , 25-hydroxy    6. 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  NF labs today  Return in about 1 year (around 12/19/2025) for depending on results.  Patient Care Team: Maloree Uplinger K, MD as PCP - General Patient Instructions  Good to see you today . Continue lifestyle intervention healthy eating and exercise .  Glad joint pains are better .  Lab  today    checking sugars and vit d  magnesium etc .  Marquize Seib K. Daran Favaro M.D.  "

## 2024-12-26 ENCOUNTER — Ambulatory Visit: Payer: Self-pay | Admitting: Internal Medicine

## 2024-12-26 ENCOUNTER — Encounter: Payer: Self-pay | Admitting: Internal Medicine

## 2024-12-26 NOTE — Progress Notes (Signed)
 Results are ok eos on cbc are sometimes seen  with allergy but not alarming and no   intervention  just get yearly blood count   cholesterol   hdl could be better  but  better than last year .  Advice increase exercise as  you are doing and healthy eating. Thyroid  nl  no diabetes  kidney function nl  VIt  D is   moderately low.   I would advise taking an OTC vit D supplement  1000 to 2000 I U per day  .

## 2024-12-27 NOTE — Telephone Encounter (Signed)
 Apology for delay  I answered in the result note
